# Patient Record
Sex: Female | Born: 1955 | Race: White | Hispanic: No | Smoking: Never smoker
Health system: Southern US, Community
[De-identification: ages and names within clinical notes are randomized; demographics above are authoritative.]

## PROBLEM LIST (undated history)

## (undated) DIAGNOSIS — G35 Multiple sclerosis: Secondary | ICD-10-CM

## (undated) DIAGNOSIS — F329 Major depressive disorder, single episode, unspecified: Secondary | ICD-10-CM

## (undated) DIAGNOSIS — M4800 Spinal stenosis, site unspecified: Secondary | ICD-10-CM

## (undated) DIAGNOSIS — I3139 Other pericardial effusion (noninflammatory): Secondary | ICD-10-CM

## (undated) DIAGNOSIS — R51 Headache: Secondary | ICD-10-CM

## (undated) DIAGNOSIS — E785 Hyperlipidemia, unspecified: Secondary | ICD-10-CM

## (undated) DIAGNOSIS — F32A Depression, unspecified: Secondary | ICD-10-CM

## (undated) DIAGNOSIS — R519 Headache, unspecified: Secondary | ICD-10-CM

## (undated) DIAGNOSIS — H409 Unspecified glaucoma: Secondary | ICD-10-CM

## (undated) HISTORY — PX: LEG ANGIOGRAPHY: SHX6672

## (undated) HISTORY — PX: COLONOSCOPY: SHX174

## (undated) HISTORY — PX: CHOLECYSTECTOMY: SHX55

## (undated) HISTORY — PX: CARPAL TUNNEL RELEASE: SHX101

## (undated) HISTORY — PX: BACK SURGERY: SHX140

## (undated) HISTORY — PX: TONSILLECTOMY: SUR1361

---

## 2015-05-29 ENCOUNTER — Other Ambulatory Visit: Payer: Self-pay | Admitting: Neurology

## 2015-05-29 DIAGNOSIS — G35 Multiple sclerosis: Secondary | ICD-10-CM

## 2015-06-17 ENCOUNTER — Ambulatory Visit
Admission: RE | Admit: 2015-06-17 | Discharge: 2015-06-17 | Disposition: A | Discharge status: Home / Self Care | Payer: Medicare Other | Source: Ambulatory Visit | Attending: Neurology | Admitting: Neurology

## 2015-06-17 DIAGNOSIS — Z9889 Other specified postprocedural states: Secondary | ICD-10-CM | POA: Insufficient documentation

## 2015-06-17 DIAGNOSIS — M5136 Other intervertebral disc degeneration, lumbar region: Secondary | ICD-10-CM | POA: Insufficient documentation

## 2015-06-17 DIAGNOSIS — M4806 Spinal stenosis, lumbar region: Secondary | ICD-10-CM | POA: Diagnosis not present

## 2015-06-17 DIAGNOSIS — N281 Cyst of kidney, acquired: Secondary | ICD-10-CM | POA: Insufficient documentation

## 2015-06-17 DIAGNOSIS — G35 Multiple sclerosis: Secondary | ICD-10-CM | POA: Insufficient documentation

## 2015-06-17 MED ORDER — GADOBENATE DIMEGLUMINE 529 MG/ML IV SOLN
8.0000 mL | Freq: Once | INTRAVENOUS | Status: AC | PRN
  Administered 2015-06-17: 8 mL via INTRAVENOUS

## 2015-10-25 ENCOUNTER — Encounter (INDEPENDENT_AMBULATORY_CARE_PROVIDER_SITE_OTHER): Payer: Medicare HMO | Admitting: Ophthalmology

## 2015-10-25 DIAGNOSIS — H2513 Age-related nuclear cataract, bilateral: Secondary | ICD-10-CM

## 2015-10-25 DIAGNOSIS — H35342 Macular cyst, hole, or pseudohole, left eye: Secondary | ICD-10-CM | POA: Diagnosis not present

## 2015-10-25 DIAGNOSIS — H43813 Vitreous degeneration, bilateral: Secondary | ICD-10-CM | POA: Diagnosis not present

## 2015-10-25 NOTE — H&P (Signed)
Carly Abbott is an 60 y.o. female.   Chief Complaint:blurred vision left eye HPI: blurred vision left eye.  Unknown duration, had eye exam 3 years ago.  No past medical history on file.  No past surgical history on file.  No family history on file. Social History:  has no tobacco, alcohol, and drug history on file.  Allergies: Allergies not on file  No prescriptions prior to admission    Review of systems otherwise negative  There were no vitals taken for this visit.  Physical exam: Mental status: oriented x3. Eyes: See eye exam associated with this date of surgery in media tab.  Scanned in by scanning center Ears, Nose, Throat: within normal limits Neck: Within Normal limits General: within normal limits Chest: Within normal limits Breast: deferred Heart: Within normal limits Abdomen: Within normal limits GU: deferred Extremities: within normal limits Skin: within normal limits  Assessment/Plan Full thickness macular hole left eye Plan: To [facility] for Pars plana vitrectomy, membrane peel. Laser, serum patch, gas injection left eye  Hayden Pedro 10/25/2015, 3:38 PM

## 2015-11-04 NOTE — Progress Notes (Signed)
Called pt for pre-op call. There was a note from the pharmacy tech that pt had cancelled surgery. When I asked her if she was still having the surgery, she states the surgery was for today and she hadn't been able to get her the exam done that Dr. Zigmund Daniel wanted her to have. I told her that her surgery is tomorrow and if she is cancelling, she needs to call his office. She states she had tried calling them on Thursday but the office was closed and it didn't have any way of leaving a message.

## 2015-11-05 ENCOUNTER — Ambulatory Visit (HOSPITAL_COMMUNITY): Admission: RE | Admit: 2015-11-05 | Payer: Medicare HMO | Source: Ambulatory Visit | Admitting: Ophthalmology

## 2015-11-05 ENCOUNTER — Encounter (HOSPITAL_COMMUNITY): Admission: RE | Payer: Self-pay | Source: Ambulatory Visit

## 2015-11-05 SURGERY — 25 GAUGE PARS PLANA VITRECTOMY WITH 20 GAUGE MVR PORT FOR MACULAR HOLE
Anesthesia: General | Laterality: Left

## 2015-11-11 ENCOUNTER — Inpatient Hospital Stay (INDEPENDENT_AMBULATORY_CARE_PROVIDER_SITE_OTHER): Payer: Medicare HMO | Admitting: Ophthalmology

## 2016-01-10 ENCOUNTER — Other Ambulatory Visit: Payer: Self-pay | Admitting: Family Medicine

## 2016-05-15 ENCOUNTER — Encounter: Payer: Self-pay | Admitting: *Deleted

## 2016-05-18 ENCOUNTER — Ambulatory Visit: Payer: Medicare Other | Admitting: Certified Registered Nurse Anesthetist

## 2016-05-18 ENCOUNTER — Encounter: Payer: Self-pay | Admitting: *Deleted

## 2016-05-18 ENCOUNTER — Encounter
Admission: RE | Disposition: A | Discharge status: Home / Self Care | Payer: Self-pay | Source: Ambulatory Visit | Attending: Unknown Physician Specialty

## 2016-05-18 ENCOUNTER — Ambulatory Visit
Admission: RE | Admit: 2016-05-18 | Discharge: 2016-05-18 | Disposition: A | Discharge status: Home / Self Care | Payer: Medicare Other | Source: Ambulatory Visit | Attending: Unknown Physician Specialty | Admitting: Unknown Physician Specialty

## 2016-05-18 DIAGNOSIS — H409 Unspecified glaucoma: Secondary | ICD-10-CM | POA: Insufficient documentation

## 2016-05-18 DIAGNOSIS — Z79899 Other long term (current) drug therapy: Secondary | ICD-10-CM | POA: Diagnosis not present

## 2016-05-18 DIAGNOSIS — F329 Major depressive disorder, single episode, unspecified: Secondary | ICD-10-CM | POA: Insufficient documentation

## 2016-05-18 DIAGNOSIS — Z800 Family history of malignant neoplasm of digestive organs: Secondary | ICD-10-CM | POA: Diagnosis not present

## 2016-05-18 DIAGNOSIS — K640 First degree hemorrhoids: Secondary | ICD-10-CM | POA: Insufficient documentation

## 2016-05-18 DIAGNOSIS — Z1211 Encounter for screening for malignant neoplasm of colon: Secondary | ICD-10-CM | POA: Insufficient documentation

## 2016-05-18 DIAGNOSIS — K621 Rectal polyp: Secondary | ICD-10-CM | POA: Insufficient documentation

## 2016-05-18 DIAGNOSIS — E785 Hyperlipidemia, unspecified: Secondary | ICD-10-CM | POA: Diagnosis not present

## 2016-05-18 DIAGNOSIS — R51 Headache: Secondary | ICD-10-CM | POA: Diagnosis not present

## 2016-05-18 DIAGNOSIS — G35 Multiple sclerosis: Secondary | ICD-10-CM | POA: Insufficient documentation

## 2016-05-18 HISTORY — DX: Depression, unspecified: F32.A

## 2016-05-18 HISTORY — DX: Hyperlipidemia, unspecified: E78.5

## 2016-05-18 HISTORY — PX: COLONOSCOPY WITH PROPOFOL: SHX5780

## 2016-05-18 HISTORY — DX: Spinal stenosis, site unspecified: M48.00

## 2016-05-18 HISTORY — DX: Headache: R51

## 2016-05-18 HISTORY — DX: Headache, unspecified: R51.9

## 2016-05-18 HISTORY — DX: Major depressive disorder, single episode, unspecified: F32.9

## 2016-05-18 HISTORY — DX: Unspecified glaucoma: H40.9

## 2016-05-18 HISTORY — DX: Multiple sclerosis: G35

## 2016-05-18 SURGERY — COLONOSCOPY WITH PROPOFOL
Anesthesia: General

## 2016-05-18 MED ORDER — PROPOFOL 10 MG/ML IV BOLUS
INTRAVENOUS | Status: DC | PRN
  Administered 2016-05-18 (×2): 10 mg via INTRAVENOUS
  Administered 2016-05-18: 50 mg via INTRAVENOUS

## 2016-05-18 MED ORDER — PROPOFOL 500 MG/50ML IV EMUL
INTRAVENOUS | Status: DC | PRN
  Administered 2016-05-18: 140 ug/kg/min via INTRAVENOUS

## 2016-05-18 MED ORDER — SODIUM CHLORIDE 0.9 % IV SOLN
INTRAVENOUS | Status: DC
  Administered 2016-05-18: 1000 mL via INTRAVENOUS

## 2016-05-18 NOTE — Transfer of Care (Signed)
Immediate Anesthesia Transfer of Care Note  Patient: SCOTTI MOTTER  Procedure(s) Performed: Procedure(s): COLONOSCOPY WITH PROPOFOL (N/A)  Patient Location: Endoscopy Unit  Anesthesia Type:General  Level of Consciousness: awake, alert , oriented and patient cooperative  Airway & Oxygen Therapy: Patient Spontanous Breathing and Patient connected to nasal cannula oxygen  Post-op Assessment: Report given to RN, Post -op Vital signs reviewed and stable and Patient moving all extremities X 4  Post vital signs: Reviewed and stable  Last Vitals:  Vitals:   05/18/16 1321  BP: 133/87  Pulse: 97  Resp: 18  Temp: 37.1 C    Last Pain:  Vitals:   05/18/16 1321  TempSrc: Tympanic  PainSc: 7          Complications: No apparent anesthesia complications

## 2016-05-18 NOTE — Anesthesia Postprocedure Evaluation (Signed)
Anesthesia Post Note  Patient: Carly Abbott  Procedure(s) Performed: Procedure(s) (LRB): COLONOSCOPY WITH PROPOFOL (N/A)  Patient location during evaluation: Endoscopy Anesthesia Type: General Level of consciousness: awake and alert Pain management: pain level controlled Vital Signs Assessment: post-procedure vital signs reviewed and stable Respiratory status: spontaneous breathing and respiratory function stable Cardiovascular status: stable Anesthetic complications: no    Last Vitals:  Vitals:   05/18/16 1525 05/18/16 1535  BP: (!) 124/93 (!) 151/95  Pulse:    Resp:    Temp:      Last Pain:  Vitals:   05/18/16 1515  TempSrc: Tympanic  PainSc:                  KEPHART,WILLIAM K

## 2016-05-18 NOTE — Anesthesia Preprocedure Evaluation (Signed)
Anesthesia Evaluation  Patient identified by MRN, date of birth, ID band Patient awake    Reviewed: Allergy & Precautions, NPO status , Patient's Chart, lab work & pertinent test results  History of Anesthesia Complications Negative for: history of anesthetic complications  Airway Mallampati: II  TM Distance: >3 FB Neck ROM: Full    Dental no notable dental hx.    Pulmonary neg pulmonary ROS, neg sleep apnea, neg COPD,    breath sounds clear to auscultation- rhonchi (-) wheezing      Cardiovascular Exercise Tolerance: Good (-) hypertension(-) angina(-) CAD and (-) Past MI  Rhythm:Regular Rate:Normal - Systolic murmurs and - Diastolic murmurs    Neuro/Psych  Headaches, Depression Multiple sclerosis     GI/Hepatic negative GI ROS, Neg liver ROS,   Endo/Other  negative endocrine ROSneg diabetes  Renal/GU negative Renal ROS     Musculoskeletal negative musculoskeletal ROS (+)   Abdominal (+) - obese,   Peds  Hematology negative hematology ROS (+)   Anesthesia Other Findings Past Medical History: No date: Depression No date: Glaucoma No date: Headache No date: Hyperlipidemia No date: Multiple sclerosis (HCC) No date: Spinal stenosis   Reproductive/Obstetrics                             Anesthesia Physical Anesthesia Plan  ASA: II  Anesthesia Plan: General   Post-op Pain Management:    Induction: Intravenous  Airway Management Planned: Natural Airway  Additional Equipment:   Intra-op Plan:   Post-operative Plan:   Informed Consent: I have reviewed the patients History and Physical, chart, labs and discussed the procedure including the risks, benefits and alternatives for the proposed anesthesia with the patient or authorized representative who has indicated his/her understanding and acceptance.   Dental advisory given  Plan Discussed with: CRNA and  Anesthesiologist  Anesthesia Plan Comments:         Anesthesia Quick Evaluation

## 2016-05-18 NOTE — H&P (Signed)
   Primary Care Physician:  Dion Body, MD Primary Gastroenterologist:  Dr. Vira Agar  Pre-Procedure History & Physical: HPI:  Carly Abbott is a 60 y.o. female is here for an colonoscopy.   Past Medical History:  Diagnosis Date  . Depression   . Glaucoma   . Headache   . Hyperlipidemia   . Multiple sclerosis ([place])   . Spinal stenosis     Past Surgical History:  Procedure Laterality Date  . BACK SURGERY    . CARPAL TUNNEL RELEASE    . CHOLECYSTECTOMY    . COLONOSCOPY    . LEG ANGIOGRAPHY    . TONSILLECTOMY      Prior to Admission medications   Medication Sig Start Date End Date Taking? Authorizing Provider  atorvastatin (LIPITOR) 10 MG tablet Take 10 mg by mouth daily.   Yes Historical Provider, MD  calcium carbonate (OSCAL) 1500 (600 Ca) MG TABS tablet Take by mouth 2 (two) times daily with a meal.   Yes Historical Provider, MD  cyclobenzaprine (FLEXERIL) 10 MG tablet Take 10 mg by mouth 3 (three) times daily as needed for muscle spasms.   Yes Historical Provider, MD  diazepam (VALIUM) 5 MG tablet Take 5 mg by mouth every 6 (six) hours as needed for anxiety.   Yes Historical Provider, MD  Interferon Beta-1a (AVONEX PEN) 30 MCG/0.5ML AJKT Inject into the muscle.   Yes Historical Provider, MD  latanoprost (XALATAN) 0.005 % ophthalmic solution 1 drop at bedtime.   Yes Historical Provider, MD  sertraline (ZOLOFT) 100 MG tablet Take 100 mg by mouth daily.   Yes Historical Provider, MD    Allergies as of 03/27/2016  . (Not on File)    History reviewed. No pertinent family history.  Social History   Social History  . Marital status: Divorced    Spouse name: N/A  . Number of children: N/A  . Years of education: N/A   Occupational History  . Not on file.   Social History Main Topics  . Smoking status: Never Smoker  . Smokeless tobacco: Never Used  . Alcohol use No  . Drug use: No  . Sexual activity: Not on file   Other Topics Concern  . Not on file    Social History Narrative  . No narrative on file    Review of Systems: See HPI, otherwise negative ROS  Physical Exam: BP 133/87   Pulse 97   Temp 98.7 F (37.1 C) (Tympanic)   Resp 18   Ht 5' (1.524 m)   Wt 43.1 kg (95 lb)   SpO2 97%   BMI 18.55 kg/m  General:   Alert,  pleasant and cooperative in NAD Head:  Normocephalic and atraumatic. Neck:  Supple; no masses or thyromegaly. Lungs:  Clear throughout to auscultation.    Heart:  Regular rate and rhythm. Abdomen:  Soft, nontender and nondistended. Normal bowel sounds, without guarding, and without rebound.   Neurologic:  Alert and  oriented x4;  grossly normal neurologically.  Impression/Plan: [name] is here for an colonoscopy to be performed for FH colon cancer in mother  Risks, benefits, limitations, and alternatives regarding  colonoscopy have been reviewed with the patient.  Questions have been answered.  All parties agreeable.   Gaylyn Cheers, MD  05/18/2016, 2:33 PM

## 2016-05-18 NOTE — Op Note (Addendum)
[facility] Gastroenterology Patient Name: Carly Abbott Procedure Date: 05/18/2016 2:25 PM MRN: [id number] Account #: [id number] Date of Birth: June 19, 1956 Admit Type: Outpatient Age: 60 Room: [facility] ENDO ROOM 4 Gender: Female Note Status: Finalized Procedure:            Colonoscopy Indications:          Screening in patient at increased risk: Family history                        of 1st-degree relative with colorectal cancer Providers:            Manya Silvas, MD Referring MD:         Dion Body (Referring MD) Medicines:            Propofol per Anesthesia Complications:        No immediate complications. Procedure:            Pre-Anesthesia Assessment:                       - After reviewing the risks and benefits, the patient                        was deemed in satisfactory condition to undergo the                        procedure.                       After obtaining informed consent, the colonoscope was                        passed under direct vision. Throughout the procedure,                        the patient's blood pressure, pulse, and oxygen                        saturations were monitored continuously. The                        Colonoscope was introduced through the anus and                        advanced to the the cecum, identified by appendiceal                        orifice and ileocecal valve. The colonoscopy was                        technically difficult and complex due to significant                        looping and a tortuous colon. Successful completion of                        the procedure was aided by applying abdominal pressure.                        The patient tolerated the procedure well. The quality  of the bowel preparation was excellent. Findings:      The colon was quite long and redundant.      A diminutive polyp was found in the rectum. The polyp was sessile. The       polyp was removed  with a jumbo cold forceps. Resection and retrieval       were complete.      Internal hemorrhoids were found during endoscopy. The hemorrhoids were       small and Grade I (internal hemorrhoids that do not prolapse).      The exam was otherwise without abnormality. Impression:           - One diminutive polyp in the rectum, removed with a                        jumbo cold forceps. Resected and retrieved.                       - Internal hemorrhoids.                       - The examination was otherwise normal. Recommendation:       - Await pathology results. Manya Silvas, MD 05/18/2016 3:12:02 PM This report has been signed electronically. Number of Addenda: 0 Note Initiated On: 05/18/2016 2:25 PM Scope Withdrawal Time: 0 hours 12 minutes 4 seconds  Total Procedure Duration: 0 hours 29 minutes 19 seconds       [facility]

## 2016-05-19 ENCOUNTER — Encounter: Payer: Self-pay | Admitting: Unknown Physician Specialty

## 2016-05-20 LAB — SURGICAL PATHOLOGY

## 2017-02-25 ENCOUNTER — Other Ambulatory Visit: Payer: Self-pay | Admitting: Neurology

## 2017-02-25 DIAGNOSIS — G35 Multiple sclerosis: Secondary | ICD-10-CM

## 2017-03-03 ENCOUNTER — Ambulatory Visit
Admission: RE | Admit: 2017-03-03 | Discharge: 2017-03-03 | Disposition: A | Discharge status: Home / Self Care | Payer: Medicare Other | Source: Ambulatory Visit | Attending: Neurology | Admitting: Neurology

## 2018-04-14 ENCOUNTER — Other Ambulatory Visit: Payer: Self-pay | Admitting: Family Medicine

## 2018-04-14 DIAGNOSIS — Z1231 Encounter for screening mammogram for malignant neoplasm of breast: Secondary | ICD-10-CM

## 2018-04-25 ENCOUNTER — Ambulatory Visit
Admission: RE | Admit: 2018-04-25 | Discharge: 2018-04-25 | Disposition: A | Discharge status: Home / Self Care | Payer: Medicare Other | Source: Ambulatory Visit | Attending: Neurology | Admitting: Neurology

## 2018-04-25 DIAGNOSIS — G35 Multiple sclerosis: Secondary | ICD-10-CM | POA: Diagnosis present

## 2018-04-25 DIAGNOSIS — D3610 Benign neoplasm of peripheral nerves and autonomic nervous system, unspecified: Secondary | ICD-10-CM | POA: Diagnosis not present

## 2018-04-25 MED ORDER — GADOBUTROL 1 MMOL/ML IV SOLN
4.0000 mL | Freq: Once | INTRAVENOUS | Status: AC | PRN
  Administered 2018-04-25: 4 mL via INTRAVENOUS

## 2018-04-27 ENCOUNTER — Other Ambulatory Visit: Payer: Self-pay | Admitting: Sports Medicine

## 2018-04-27 DIAGNOSIS — M25511 Pain in right shoulder: Secondary | ICD-10-CM

## 2018-04-27 DIAGNOSIS — M7541 Impingement syndrome of right shoulder: Secondary | ICD-10-CM

## 2018-04-27 DIAGNOSIS — M19011 Primary osteoarthritis, right shoulder: Secondary | ICD-10-CM

## 2018-05-05 ENCOUNTER — Ambulatory Visit
Admission: RE | Admit: 2018-05-05 | Discharge: 2018-05-05 | Disposition: A | Discharge status: Home / Self Care | Payer: Medicare Other | Source: Ambulatory Visit | Attending: Family Medicine | Admitting: Family Medicine

## 2018-05-05 DIAGNOSIS — Z1231 Encounter for screening mammogram for malignant neoplasm of breast: Secondary | ICD-10-CM | POA: Diagnosis not present

## 2018-05-16 ENCOUNTER — Other Ambulatory Visit: Payer: Self-pay | Admitting: Family Medicine

## 2018-05-16 DIAGNOSIS — M4807 Spinal stenosis, lumbosacral region: Secondary | ICD-10-CM

## 2018-05-24 ENCOUNTER — Ambulatory Visit
Admission: RE | Admit: 2018-05-24 | Discharge: 2018-05-24 | Disposition: A | Discharge status: Home / Self Care | Payer: Medicare Other | Source: Ambulatory Visit | Attending: Sports Medicine | Admitting: Sports Medicine

## 2018-05-24 DIAGNOSIS — M75111 Incomplete rotator cuff tear or rupture of right shoulder, not specified as traumatic: Secondary | ICD-10-CM | POA: Diagnosis not present

## 2018-05-24 DIAGNOSIS — S46211A Strain of muscle, fascia and tendon of other parts of biceps, right arm, initial encounter: Secondary | ICD-10-CM | POA: Insufficient documentation

## 2018-05-24 DIAGNOSIS — M25511 Pain in right shoulder: Secondary | ICD-10-CM

## 2018-05-24 DIAGNOSIS — M7541 Impingement syndrome of right shoulder: Secondary | ICD-10-CM | POA: Diagnosis not present

## 2018-05-24 DIAGNOSIS — G8929 Other chronic pain: Secondary | ICD-10-CM | POA: Insufficient documentation

## 2018-05-24 DIAGNOSIS — M85611 Other cyst of bone, right shoulder: Secondary | ICD-10-CM | POA: Insufficient documentation

## 2018-05-24 DIAGNOSIS — X58XXXA Exposure to other specified factors, initial encounter: Secondary | ICD-10-CM | POA: Insufficient documentation

## 2018-05-24 DIAGNOSIS — M19011 Primary osteoarthritis, right shoulder: Secondary | ICD-10-CM | POA: Diagnosis not present

## 2018-06-07 ENCOUNTER — Ambulatory Visit
Admission: RE | Admit: 2018-06-07 | Discharge: 2018-06-07 | Disposition: A | Discharge status: Home / Self Care | Payer: Medicare Other | Source: Ambulatory Visit | Attending: Family Medicine | Admitting: Family Medicine

## 2018-06-07 DIAGNOSIS — M5136 Other intervertebral disc degeneration, lumbar region: Secondary | ICD-10-CM | POA: Insufficient documentation

## 2018-06-07 DIAGNOSIS — M48061 Spinal stenosis, lumbar region without neurogenic claudication: Secondary | ICD-10-CM | POA: Insufficient documentation

## 2018-06-07 DIAGNOSIS — M4807 Spinal stenosis, lumbosacral region: Secondary | ICD-10-CM | POA: Insufficient documentation

## 2018-07-14 ENCOUNTER — Inpatient Hospital Stay: Admission: RE | Admit: 2018-07-14 | Payer: Medicare Other | Source: Ambulatory Visit

## 2018-07-25 ENCOUNTER — Ambulatory Visit: Admission: RE | Admit: 2018-07-25 | Payer: Medicare Other | Source: Home / Self Care | Admitting: Orthopedic Surgery

## 2018-07-25 ENCOUNTER — Encounter: Admission: RE | Payer: Self-pay | Source: Home / Self Care

## 2018-07-25 SURGERY — SHOULDER ARTHROSCOPY WITH SUBACROMIAL DECOMPRESSION AND DISTAL CLAVICLE EXCISION
Anesthesia: Choice | Laterality: Right

## 2018-08-12 ENCOUNTER — Other Ambulatory Visit: Payer: Self-pay | Admitting: Neurosurgery

## 2018-08-12 DIAGNOSIS — M4807 Spinal stenosis, lumbosacral region: Secondary | ICD-10-CM

## 2018-08-17 ENCOUNTER — Ambulatory Visit: Payer: Medicare Other | Attending: Neurosurgery

## 2019-01-24 DIAGNOSIS — E7800 Pure hypercholesterolemia, unspecified: Secondary | ICD-10-CM | POA: Diagnosis not present

## 2019-01-24 DIAGNOSIS — F3340 Major depressive disorder, recurrent, in remission, unspecified: Secondary | ICD-10-CM | POA: Diagnosis not present

## 2019-02-14 DIAGNOSIS — G35 Multiple sclerosis: Secondary | ICD-10-CM | POA: Diagnosis not present

## 2019-02-14 DIAGNOSIS — Z8739 Personal history of other diseases of the musculoskeletal system and connective tissue: Secondary | ICD-10-CM | POA: Diagnosis not present

## 2019-09-09 ENCOUNTER — Other Ambulatory Visit: Payer: Self-pay

## 2019-09-09 ENCOUNTER — Ambulatory Visit: Payer: Medicare Other | Attending: Internal Medicine

## 2019-09-09 DIAGNOSIS — Z23 Encounter for immunization: Secondary | ICD-10-CM

## 2019-09-09 NOTE — Progress Notes (Signed)
   Covid-19 Vaccination Clinic  Name:  Carly Abbott    MRN: [id number] DOB: 1955-08-24  09/09/2019  [name] was observed post Covid-19 immunization for 15 minutes without incidence. She was provided with Vaccine Information Sheet and instruction to access the V-Safe system.   [name] was instructed to call 911 with any severe reactions post vaccine: Marland Kitchen Difficulty breathing  . Swelling of your face and throat  . A fast heartbeat  . A bad rash all over your body  . Dizziness and weakness    Immunizations Administered    Name Date Dose VIS Date Route   Pfizer COVID-19 Vaccine 09/09/2019  3:28 PM 0.3 mL 06/30/2019 Intramuscular   Manufacturer: [place]   Lot: [id number]   [place]: [id number]

## 2019-10-03 ENCOUNTER — Ambulatory Visit: Payer: Medicare Other | Attending: Internal Medicine

## 2019-10-03 DIAGNOSIS — Z23 Encounter for immunization: Secondary | ICD-10-CM

## 2019-10-03 NOTE — Progress Notes (Signed)
   Covid-19 Vaccination Clinic  Name:  CHANTILLE NAVARRETE    MRN: [id number] DOB: 1956-07-16  10/03/2019  Ms. Urias was observed post Covid-19 immunization for 15 minutes without incident. She was provided with Vaccine Information Sheet and instruction to access the V-Safe system.   Ms. Yost was instructed to call 911 with any severe reactions post vaccine: Marland Kitchen Difficulty breathing  . Swelling of face and throat  . A fast heartbeat  . A bad rash all over body  . Dizziness and weakness   Immunizations Administered    Name Date Dose VIS Date Route   Pfizer COVID-19 Vaccine 10/03/2019 12:12 PM 0.3 mL 06/30/2019 Intramuscular   Manufacturer: [place]   Lot: [id number]   [place]: [id number]

## 2019-10-05 IMAGING — MR MR LUMBAR SPINE W/O CM
4 of 5 series · 25 of 48 positions shown · non-contrast
Comparison: 06/17/2015

CLINICAL DATA: Low back pain radiating into the right greater than
left legs with numbness and tingling. Symptoms for 2 months.

EXAM:
MRI LUMBAR SPINE WITHOUT CONTRAST
TECHNIQUE: Multiplanar, multisequence MR imaging of the lumbar spine was
performed. No intravenous contrast was administered.

[Series 2: T2 · sagittal · 4.0mm · 0.81mm/px · 6 of 15 slices shown (1 of 2)]
[im 1/15]
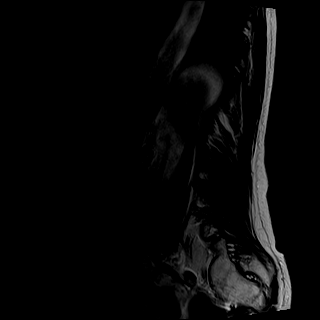
[im 3/15]
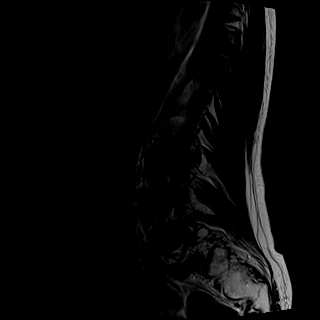
[im 6/15]
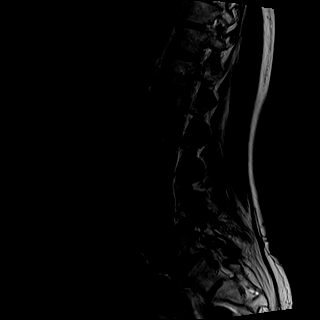
[im 9/15]
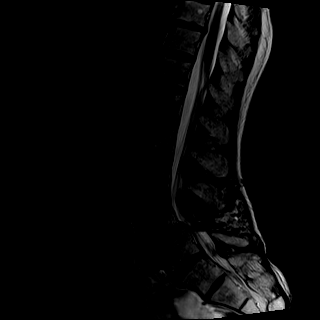
[im 12/15]
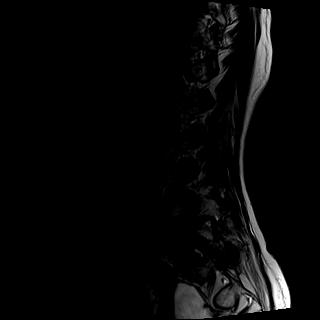
[im 15/15]
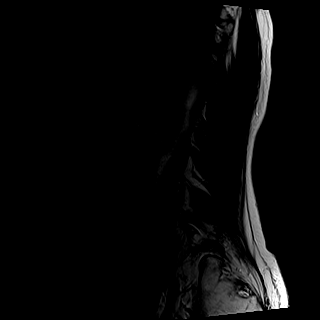

[Series 3: T1 · sagittal · 4.0mm · 0.41mm/px · 6 of 15 slices shown (1 of 2)]
[im 1/15]
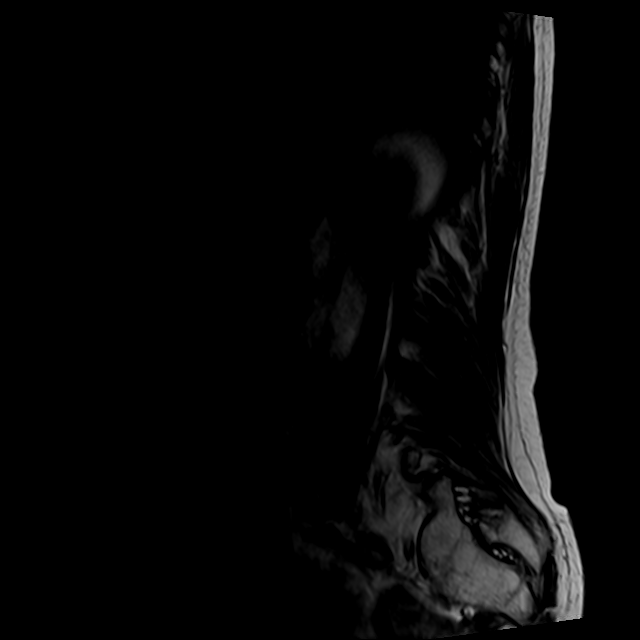
[im 3/15]
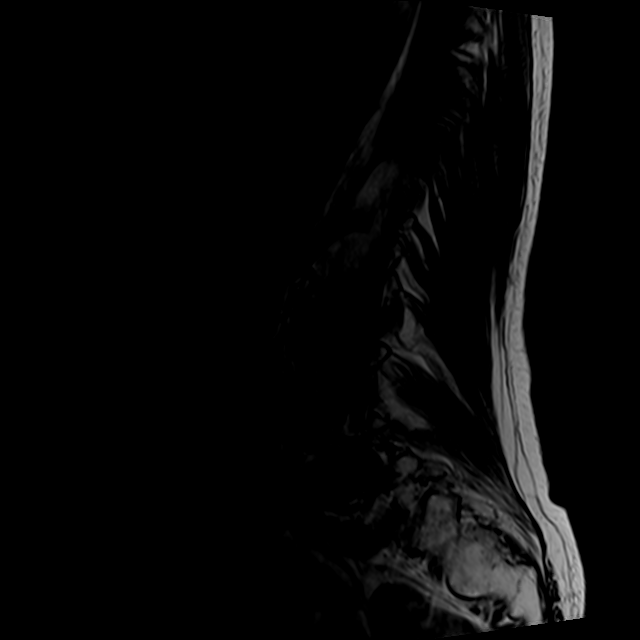
[im 6/15]
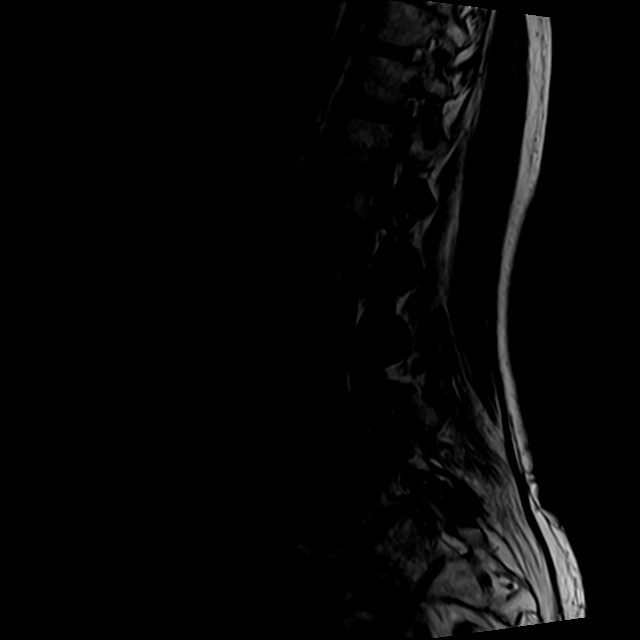
[im 9/15]
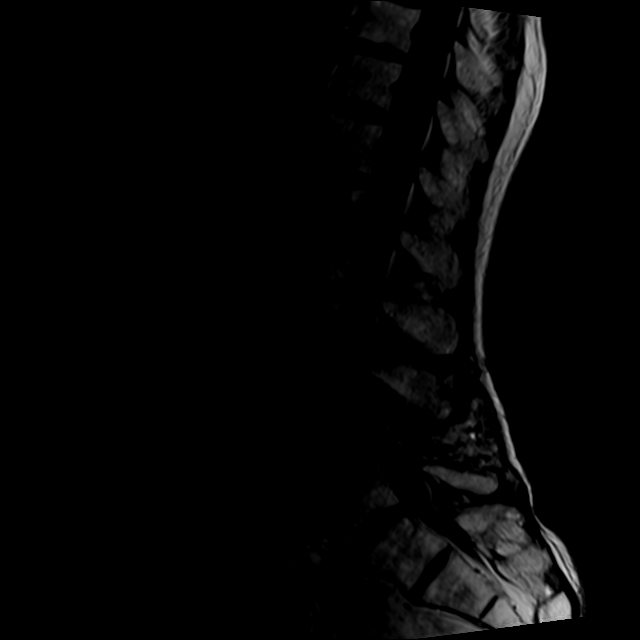
[im 12/15]
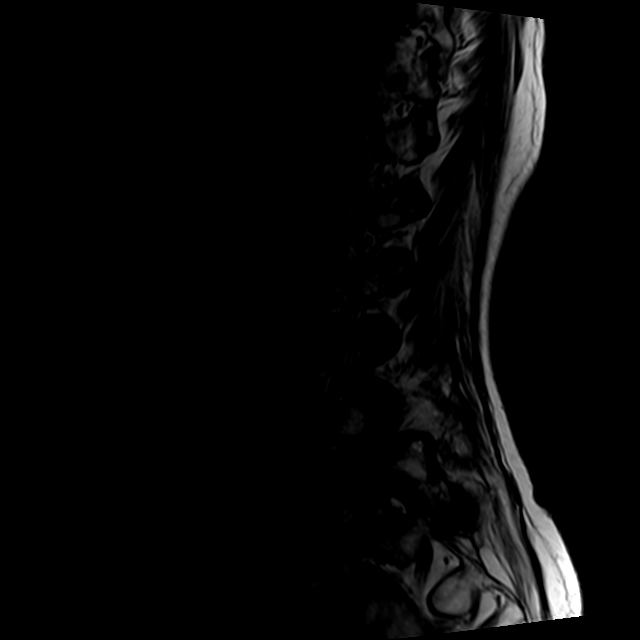
[im 15/15]
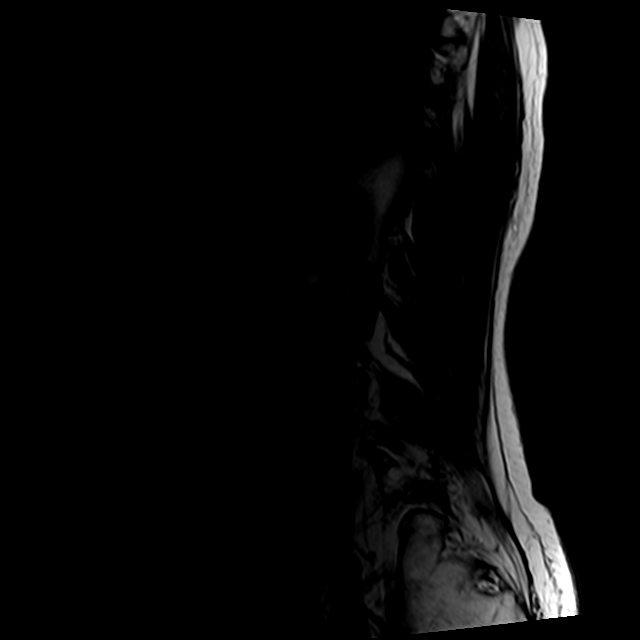

[Series 5: T2 · axial · 4.0mm · 0.78mm/px · z∈[-88,+132]mm · 9 of 40 slices shown (2 of 2)]
[im 1/40]
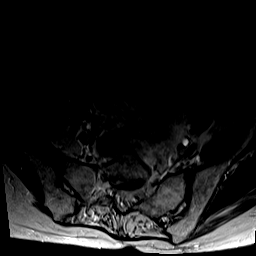
[im 6/40]
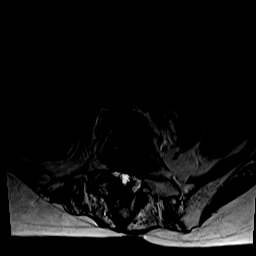
[im 12/40]
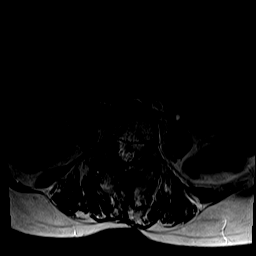
[im 17/40]
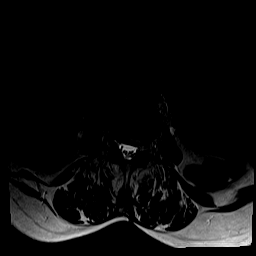
[im 20/40]
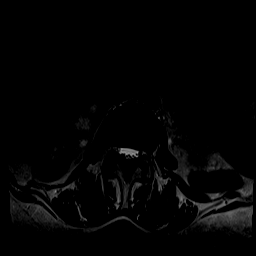
[im 23/40]
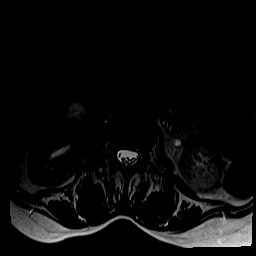
[im 28/40]
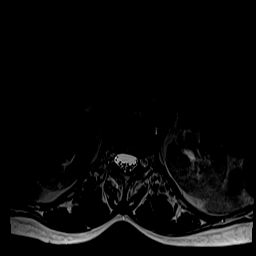
[im 34/40]
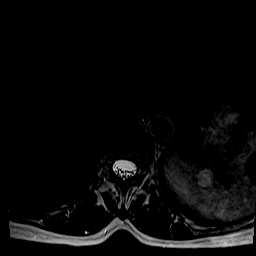
[im 40/40]
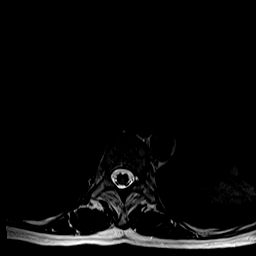

[Series 6: T1 · axial · 4.0mm · 0.39mm/px · z∈[-88,+102]mm · 4 of 40 slices shown (2 of 2)]
[im 1/40]
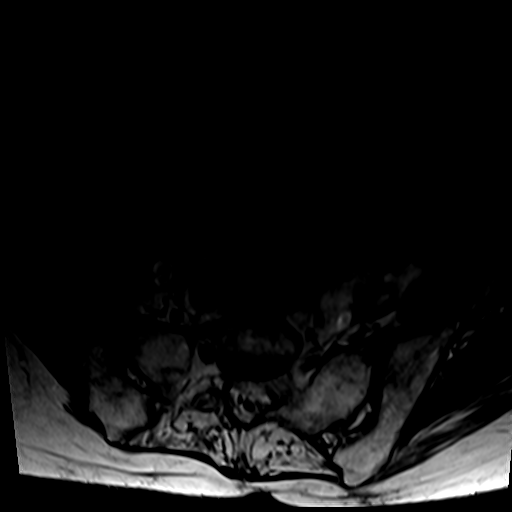
[im 6/40]
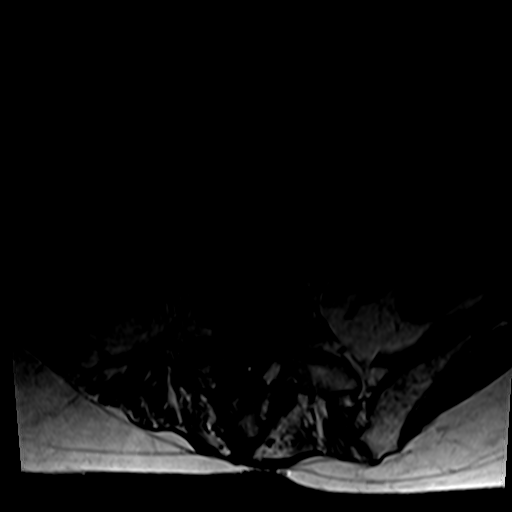
[im 20/40]
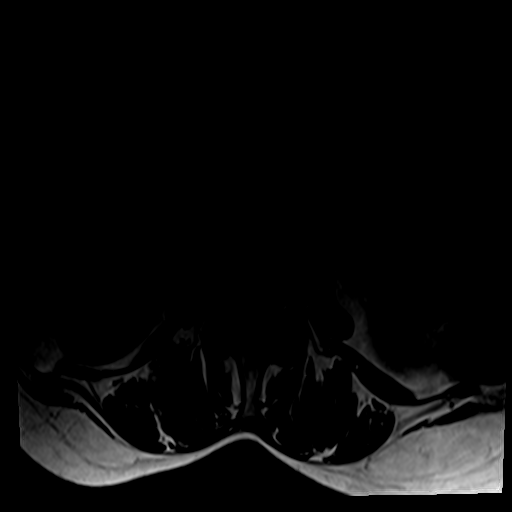
[im 34/40]
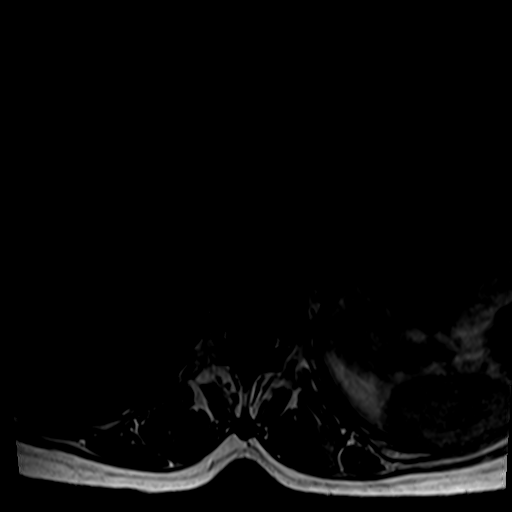

[25 of 48 positions shown; findings below may reference images not displayed]

FINDINGS: Segmentation: Transitional lumbosacral anatomy with the prior
numbering scheme continued on this examination designating a
vestigial disc at S1-2 and anterolisthesis at L4-5.

Alignment:  Unchanged 10 mm anterolisthesis of L4 on L5.

Vertebrae: No fracture or suspicious osseous lesion. Moderate
endplate edema at L4-5, increased from prior and likely
degenerative.

Conus medullaris and cauda equina: Conus extends to the T12 level.
Conus and cauda equina appear normal.

Paraspinal and other soft tissues: Unchanged 1.3 cm T2 hyperintense
lesion in the upper pole of the left kidney compatible with a cyst.
Mild chronic extrahepatic biliary dilatation, mildly decreased from
prior.

Disc levels:

Disc desiccation throughout the lumbar spine with severe disc space
narrowing at L4-5 and mild narrowing at L5-S1.

T10-11: Only imaged sagittally. Disc bulging and moderate facet
hypertrophy result in borderline spinal stenosis without spinal cord
mass effect or significant neural foraminal stenosis, unchanged.

T11-12: Minimal facet arthrosis without disc herniation or stenosis.

T12-L1: Negative.

L1-2: Minimal facet arthrosis without disc herniation or stenosis.

L2-3: Minimal disc bulging and mild facet and ligamentum flavum
hypertrophy without stenosis, unchanged.

L3-4: Suspected prior left laminotomy. Mild disc bulging and
moderate facet and ligamentum flavum hypertrophy are stable to
minimally progressive and result in borderline spinal stenosis
without significant neural foraminal stenosis.

L4-5: Progressive, severe disc degeneration. Prior laminectomies.
Anterolisthesis with bulging uncovered disc and facet arthrosis
result in severe spinal stenosis, severe left greater than right
lateral recess stenosis, and severe bilateral neural foraminal
stenosis. Findings have slightly progressed from the prior study and
bilateral neural impingement is likely at this level.

L5-S1: Posterior postsurgical changes bilaterally. Mild disc bulging
asymmetric to the left and facet hypertrophy result in mild spinal
and bilateral lateral recess stenosis without significant neural
foraminal stenosis, unchanged.
IMPRESSION: 1. Mild progression of severe L4-5 disc degeneration with severe
spinal and bilateral neural foraminal stenosis.
2. Stable degenerative and postoperative changes elsewhere as above.

## 2021-05-09 DIAGNOSIS — Z23 Encounter for immunization: Secondary | ICD-10-CM | POA: Diagnosis not present

## 2021-09-29 ENCOUNTER — Other Ambulatory Visit (INDEPENDENT_AMBULATORY_CARE_PROVIDER_SITE_OTHER): Payer: Self-pay | Admitting: Nurse Practitioner

## 2021-09-29 DIAGNOSIS — R209 Unspecified disturbances of skin sensation: Secondary | ICD-10-CM

## 2021-09-30 ENCOUNTER — Ambulatory Visit (INDEPENDENT_AMBULATORY_CARE_PROVIDER_SITE_OTHER): Payer: Medicare Other

## 2021-09-30 ENCOUNTER — Other Ambulatory Visit (INDEPENDENT_AMBULATORY_CARE_PROVIDER_SITE_OTHER): Payer: Self-pay | Admitting: Nurse Practitioner

## 2021-09-30 ENCOUNTER — Other Ambulatory Visit: Payer: Self-pay

## 2021-09-30 ENCOUNTER — Encounter (INDEPENDENT_AMBULATORY_CARE_PROVIDER_SITE_OTHER): Payer: Self-pay | Admitting: Nurse Practitioner

## 2021-09-30 ENCOUNTER — Ambulatory Visit (INDEPENDENT_AMBULATORY_CARE_PROVIDER_SITE_OTHER): Payer: Medicare Other | Admitting: Nurse Practitioner

## 2021-09-30 VITALS — BP 152/91 | HR 91 | Resp 17 | Ht <= 58 in | Wt 85.8 lb

## 2021-09-30 DIAGNOSIS — R209 Unspecified disturbances of skin sensation: Secondary | ICD-10-CM

## 2021-09-30 DIAGNOSIS — G35 Multiple sclerosis: Secondary | ICD-10-CM

## 2021-09-30 MED ORDER — AMLODIPINE BESYLATE 5 MG PO TABS: 5.0000 mg | ORAL_TABLET | Freq: Every day | ORAL | 2 refills | Status: DC

## 2021-10-08 ENCOUNTER — Encounter (INDEPENDENT_AMBULATORY_CARE_PROVIDER_SITE_OTHER): Payer: Self-pay | Admitting: Nurse Practitioner

## 2021-10-08 NOTE — Progress Notes (Signed)
? ?Subjective:  ? ? Patient ID: Carly Abbott, female    DOB: 1955/09/28, 66 y.o.   MRN: [id number] ?Chief Complaint  ?Patient presents with  ? Establish Care  ?  Ref by dr Netty Starring for ABI consult  ? ? ?Carly Abbott is a 66 year old female that presents today as a consult from her primary care physician in regards to numbness and discoloration in her lower extremities.  The patient has a history of multiple sclerosis.  She notes that this has been ongoing for years.  She notes that the lower extremities do not have any open wounds or ulcerations.  Today the patient has a right ABI of 1.26 with a left of 1.28.  She has strong biphasic tibial artery waveforms bilaterally.  Initially waveforms in the bilateral toes were severely dampened on the right first and second digit and not detected in the third fourth and fifth.  The left lower extremity had no waveforms detected throughout the foot.  The toe waveforms following 30 minutes of warming show improved waveforms that are dampened bilaterally. ? ? ?Review of Systems  ?Skin:  Positive for color change.  ?All other systems reviewed and are negative. ? ?   ?Objective:  ? Physical Exam ?Vitals reviewed.  ?HENT:  ?   Head: Normocephalic.  ?Cardiovascular:  ?   Rate and Rhythm: Normal rate.  ?   Pulses: Normal pulses.  ?Pulmonary:  ?   Effort: Pulmonary effort is normal.  ?Skin: ?   General: Skin is warm and dry.  ?Neurological:  ?   Mental Status: She is alert and oriented to person, place, and time.  ?Psychiatric:     ?   Mood and Affect: Mood normal.     ?   Behavior: Behavior normal.     ?   Thought Content: Thought content normal.     ?   Judgment: Judgment normal.  ? ? ?BP (!) 152/91 (BP Location: Right Arm)   Pulse 91   Resp 17   Ht 4\' 10"  (1.473 m)   Wt 85 lb 12.8 oz (38.9 kg)   BMI 17.93 kg/m?  ? ?Past Medical History:  ?Diagnosis Date  ? Depression   ? Glaucoma   ? Headache   ? Hyperlipidemia   ? Multiple sclerosis ([place])   ? Spinal stenosis   ? ? ?Social  History  ? ?Socioeconomic History  ? Marital status: Divorced  ?  Spouse name: Not on file  ? Number of children: Not on file  ? Years of education: Not on file  ? Highest education level: Not on file  ?Occupational History  ? Not on file  ?Tobacco Use  ? Smoking status: Never  ? Smokeless tobacco: Never  ?Substance and Sexual Activity  ? Alcohol use: No  ? Drug use: No  ? Sexual activity: Not on file  ?Other Topics Concern  ? Not on file  ?Social History Narrative  ? Not on file  ? ?Social Determinants of Health  ? ?Financial Resource Strain: Not on file  ?Food Insecurity: Not on file  ?Transportation Needs: Not on file  ?Physical Activity: Not on file  ?Stress: Not on file  ?Social Connections: Not on file  ?Intimate Partner Violence: Not on file  ? ? ?Past Surgical History:  ?Procedure Laterality Date  ? BACK SURGERY    ? CARPAL TUNNEL RELEASE    ? CHOLECYSTECTOMY    ? COLONOSCOPY    ? COLONOSCOPY WITH PROPOFOL N/A 05/18/2016  ?  Procedure: COLONOSCOPY WITH PROPOFOL;  Surgeon: Manya Silvas, MD;  Location: [facility] ENDOSCOPY;  Service: Endoscopy;  Laterality: N/A;  ? LEG ANGIOGRAPHY    ? TONSILLECTOMY    ? ? ?Family History  ?Problem Relation Age of Onset  ? Breast cancer Neg Hx   ? ? ?Allergies  ?Allergen Reactions  ? Cardizem [Diltiazem Hcl] Itching  ? ? ?   ? View : No data to display.  ?  ?  ?  ? ? ? ? ?CMP  ?No results found for: NA, K, CL, CO2, GLUCOSE, BUN, CREATININE, CALCIUM, PROT, ALBUMIN, AST, ALT, ALKPHOS, BILITOT, GFRNONAA, GFRAA ? ? ?No results found. ? ?   ?Assessment & Plan:  ? ?1. Cold feet ?Noninvasive studies today are consistent with Raynaud's disease.  We discussed typical treatment of Raynaud's disease includes use of calcium channel blockers as well as use of warm socks as well as what was.  Currently there are no open lesions ulcerations.  We will have the patient begin a low-dose amlodipine determine if this helps the discoloration symptoms. ?- VAS [place] LE ART SEG MULTI (Segm&LE  Reynauds) ? ? ?Current Outpatient Medications on File Prior to Visit  ?Medication Sig Dispense Refill  ? acetaminophen (TYLENOL) 500 MG tablet Take 500 mg by mouth every 6 (six) hours as needed.    ? DULoxetine (CYMBALTA) 30 MG capsule Take 30 mg by mouth daily.    ? latanoprost (XALATAN) 0.005 % ophthalmic solution 1 drop at bedtime.    ? atorvastatin (LIPITOR) 10 MG tablet Take 10 mg by mouth daily. (Patient not taking: Reported on 09/30/2021)    ? cyclobenzaprine (FLEXERIL) 10 MG tablet Take 10 mg by mouth 3 (three) times daily as needed for muscle spasms. (Patient not taking: Reported on 09/30/2021)    ? diazepam (VALIUM) 5 MG tablet Take 5 mg by mouth every 6 (six) hours as needed for anxiety. (Patient not taking: Reported on 09/30/2021)    ? Interferon Beta-1a 30 MCG/0.5ML AJKT Inject into the muscle. (Patient not taking: Reported on 09/30/2021)    ? ?No current facility-administered medications on file prior to visit.  ? ? ?There are no Patient Instructions on file for this visit. ?No follow-ups on file. ? ? ?Kris Hartmann, NP ? ? ?

## 2021-10-29 ENCOUNTER — Encounter (INDEPENDENT_AMBULATORY_CARE_PROVIDER_SITE_OTHER): Payer: Medicaid Other

## 2021-10-29 ENCOUNTER — Encounter (INDEPENDENT_AMBULATORY_CARE_PROVIDER_SITE_OTHER): Payer: Medicaid Other | Admitting: Nurse Practitioner

## 2021-11-11 ENCOUNTER — Ambulatory Visit (INDEPENDENT_AMBULATORY_CARE_PROVIDER_SITE_OTHER): Payer: Medicare Other | Admitting: Nurse Practitioner

## 2021-11-20 ENCOUNTER — Observation Stay (HOSPITAL_BASED_OUTPATIENT_CLINIC_OR_DEPARTMENT_OTHER)
Admission: EM | Admit: 2021-11-20 | Discharge: 2021-11-22 | Disposition: A | Discharge status: Home Health | Payer: Medicare Other | Source: Home / Self Care | Attending: Emergency Medicine | Admitting: Emergency Medicine

## 2021-11-20 ENCOUNTER — Other Ambulatory Visit: Payer: Self-pay

## 2021-11-20 ENCOUNTER — Emergency Department: Payer: Medicare Other

## 2021-11-20 DIAGNOSIS — E782 Mixed hyperlipidemia: Secondary | ICD-10-CM

## 2021-11-20 DIAGNOSIS — R636 Underweight: Secondary | ICD-10-CM | POA: Insufficient documentation

## 2021-11-20 DIAGNOSIS — M21379 Foot drop, unspecified foot: Secondary | ICD-10-CM | POA: Insufficient documentation

## 2021-11-20 DIAGNOSIS — F32A Depression, unspecified: Secondary | ICD-10-CM | POA: Diagnosis present

## 2021-11-20 DIAGNOSIS — C384 Malignant neoplasm of pleura: Secondary | ICD-10-CM | POA: Insufficient documentation

## 2021-11-20 DIAGNOSIS — Z79899 Other long term (current) drug therapy: Secondary | ICD-10-CM | POA: Insufficient documentation

## 2021-11-20 DIAGNOSIS — I10 Essential (primary) hypertension: Secondary | ICD-10-CM | POA: Insufficient documentation

## 2021-11-20 DIAGNOSIS — J9601 Acute respiratory failure with hypoxia: Secondary | ICD-10-CM

## 2021-11-20 DIAGNOSIS — E785 Hyperlipidemia, unspecified: Secondary | ICD-10-CM | POA: Insufficient documentation

## 2021-11-20 DIAGNOSIS — F419 Anxiety disorder, unspecified: Secondary | ICD-10-CM | POA: Insufficient documentation

## 2021-11-20 DIAGNOSIS — J449 Chronic obstructive pulmonary disease, unspecified: Secondary | ICD-10-CM | POA: Insufficient documentation

## 2021-11-20 DIAGNOSIS — J910 Malignant pleural effusion: Secondary | ICD-10-CM | POA: Insufficient documentation

## 2021-11-20 DIAGNOSIS — G35 Multiple sclerosis: Secondary | ICD-10-CM | POA: Insufficient documentation

## 2021-11-20 DIAGNOSIS — E8809 Other disorders of plasma-protein metabolism, not elsewhere classified: Secondary | ICD-10-CM

## 2021-11-20 DIAGNOSIS — Z9889 Other specified postprocedural states: Secondary | ICD-10-CM

## 2021-11-20 DIAGNOSIS — C3490 Malignant neoplasm of unspecified part of unspecified bronchus or lung: Secondary | ICD-10-CM | POA: Diagnosis not present

## 2021-11-20 DIAGNOSIS — J188 Other pneumonia, unspecified organism: Secondary | ICD-10-CM | POA: Diagnosis present

## 2021-11-20 DIAGNOSIS — J189 Pneumonia, unspecified organism: Secondary | ICD-10-CM

## 2021-11-20 DIAGNOSIS — J90 Pleural effusion, not elsewhere classified: Secondary | ICD-10-CM | POA: Diagnosis present

## 2021-11-20 LAB — CBC WITH DIFFERENTIAL/PLATELET
Abs Immature Granulocytes: 0.03 10*3/uL (ref 0.00–0.07)
Basophils Absolute: 0 10*3/uL (ref 0.0–0.1)
Basophils Relative: 0 %
Eosinophils Absolute: 0.1 10*3/uL (ref 0.0–0.5)
Eosinophils Relative: 1 %
HCT: 41.6 % (ref 36.0–46.0)
Hemoglobin: 13.7 g/dL (ref 12.0–15.0)
Immature Granulocytes: 0 %
Lymphocytes Relative: 7 %
Lymphs Abs: 0.7 10*3/uL (ref 0.7–4.0)
MCH: 29.5 pg (ref 26.0–34.0)
MCHC: 32.9 g/dL (ref 30.0–36.0)
MCV: 89.5 fL (ref 80.0–100.0)
Monocytes Absolute: 0.4 10*3/uL (ref 0.1–1.0)
Monocytes Relative: 5 %
Neutro Abs: 8.1 10*3/uL — ABNORMAL HIGH (ref 1.7–7.7)
Neutrophils Relative %: 87 %
Platelets: 458 10*3/uL — ABNORMAL HIGH (ref 150–400)
RBC: 4.65 MIL/uL (ref 3.87–5.11)
RDW: 12.3 % (ref 11.5–15.5)
WBC: 9.4 10*3/uL (ref 4.0–10.5)
nRBC: 0 % (ref 0.0–0.2)

## 2021-11-20 LAB — COMPREHENSIVE METABOLIC PANEL
ALT: 11 U/L (ref 0–44)
AST: 23 U/L (ref 15–41)
Albumin: 2.8 g/dL — ABNORMAL LOW (ref 3.5–5.0)
Alkaline Phosphatase: 92 U/L (ref 38–126)
Anion gap: 11 (ref 5–15)
BUN: 14 mg/dL (ref 8–23)
CO2: 23 mmol/L (ref 22–32)
Calcium: 8.5 mg/dL — ABNORMAL LOW (ref 8.9–10.3)
Chloride: 99 mmol/L (ref 98–111)
Creatinine, Ser: 0.68 mg/dL (ref 0.44–1.00)
GFR, Estimated: >60 mL/min (ref >60)
Glucose, Bld: 92 mg/dL (ref 70–99)
Potassium: 3.4 mmol/L — ABNORMAL LOW (ref 3.5–5.1)
Sodium: 133 mmol/L — ABNORMAL LOW (ref 135–145)
Total Bilirubin: 0.7 mg/dL (ref 0.3–1.2)
Total Protein: 6.5 g/dL (ref 6.5–8.1)

## 2021-11-20 LAB — LACTIC ACID, PLASMA: Lactic Acid, Venous: 1.8 mmol/L (ref 0.5–1.9)

## 2021-11-20 LAB — PROTIME-INR
INR: 1 (ref 0.8–1.2)
Prothrombin Time: 12.7 seconds (ref 11.4–15.2)

## 2021-11-20 LAB — APTT: aPTT: 30 seconds (ref 24–36)

## 2021-11-20 LAB — PROCALCITONIN: Procalcitonin: <0.1 ng/mL

## 2021-11-20 LAB — PHOSPHORUS: Phosphorus: 3.4 mg/dL (ref 2.5–4.6)

## 2021-11-20 LAB — MAGNESIUM: Magnesium: 1.8 mg/dL (ref 1.7–2.4)

## 2021-11-20 MED ORDER — SODIUM CHLORIDE 0.9 % IV SOLN
500.0000 mg | INTRAVENOUS | Status: DC
  Administered 2021-11-20: 500 mg via INTRAVENOUS
  Filled 2021-11-20 (×2): qty 5

## 2021-11-20 MED ORDER — SODIUM CHLORIDE 0.9 % IV SOLN: INTRAVENOUS | Status: DC

## 2021-11-20 MED ORDER — ALBUTEROL SULFATE (2.5 MG/3ML) 0.083% IN NEBU
2.5000 mg | INHALATION_SOLUTION | Freq: Four times a day (QID) | RESPIRATORY_TRACT | Status: DC | PRN
  Administered 2021-11-21: 2.5 mg via RESPIRATORY_TRACT
  Filled 2021-11-20: qty 3

## 2021-11-20 MED ORDER — AMLODIPINE BESYLATE 5 MG PO TABS
5.0000 mg | ORAL_TABLET | Freq: Every day | ORAL | Status: DC
Start: 2021-11-21 — End: 2021-11-22
  Administered 2021-11-21 – 2021-11-22 (×2): 5 mg via ORAL
  Filled 2021-11-20 (×2): qty 1

## 2021-11-20 MED ORDER — HEPARIN SODIUM (PORCINE) 5000 UNIT/ML IJ SOLN
5000.0000 [IU] | Freq: Three times a day (TID) | INTRAMUSCULAR | Status: AC
  Administered 2021-11-20: 5000 [IU] via SUBCUTANEOUS
  Filled 2021-11-20: qty 1

## 2021-11-20 MED ORDER — ONDANSETRON HCL 4 MG PO TABS: 4.0000 mg | ORAL_TABLET | Freq: Four times a day (QID) | ORAL | Status: DC | PRN

## 2021-11-20 MED ORDER — ACETAMINOPHEN 325 MG PO TABS
650.0000 mg | ORAL_TABLET | Freq: Four times a day (QID) | ORAL | Status: DC | PRN
  Administered 2021-11-21: 650 mg via ORAL
  Filled 2021-11-20: qty 2

## 2021-11-20 MED ORDER — DULOXETINE HCL 30 MG PO CPEP
30.0000 mg | ORAL_CAPSULE | Freq: Every day | ORAL | Status: DC
  Administered 2021-11-21 – 2021-11-22 (×2): 30 mg via ORAL
  Filled 2021-11-20 (×2): qty 1

## 2021-11-20 MED ORDER — ENOXAPARIN SODIUM 30 MG/0.3ML IJ SOSY: 30.0000 mg | PREFILLED_SYRINGE | INTRAMUSCULAR | Status: DC

## 2021-11-20 MED ORDER — POLYETHYLENE GLYCOL 3350 17 G PO PACK: 17.0000 g | PACK | Freq: Every day | ORAL | Status: DC | PRN

## 2021-11-20 MED ORDER — ACETAMINOPHEN 650 MG RE SUPP: 650.0000 mg | Freq: Four times a day (QID) | RECTAL | Status: DC | PRN

## 2021-11-20 MED ORDER — SODIUM CHLORIDE 0.9 % IV SOLN
2.0000 g | INTRAVENOUS | Status: DC
  Administered 2021-11-20: 2 g via INTRAVENOUS
  Filled 2021-11-20 (×2): qty 20

## 2021-11-20 MED ORDER — LORAZEPAM 2 MG/ML IJ SOLN: 1.0000 mg | Freq: Four times a day (QID) | INTRAMUSCULAR | Status: DC | PRN

## 2021-11-20 MED ORDER — ONDANSETRON HCL 4 MG/2ML IJ SOLN: 4.0000 mg | Freq: Four times a day (QID) | INTRAMUSCULAR | Status: DC | PRN

## 2021-11-20 MED ORDER — TRAMADOL HCL 50 MG PO TABS
50.0000 mg | ORAL_TABLET | Freq: Every day | ORAL | Status: DC | PRN
  Administered 2021-11-21: 50 mg via ORAL
  Filled 2021-11-20: qty 1

## 2021-11-20 MED ORDER — SODIUM CHLORIDE 0.9 % IV BOLUS (SEPSIS)
1000.0000 mL | Freq: Once | INTRAVENOUS | Status: AC
  Administered 2021-11-20: 1000 mL via INTRAVENOUS

## 2021-11-20 MED ORDER — OXYBUTYNIN CHLORIDE 5 MG PO TABS
5.0000 mg | ORAL_TABLET | Freq: Every day | ORAL | Status: DC
  Administered 2021-11-21 – 2021-11-22 (×2): 5 mg via ORAL
  Filled 2021-11-20 (×2): qty 1

## 2021-11-20 MED ORDER — GUAIFENESIN ER 600 MG PO TB12
600.0000 mg | ORAL_TABLET | Freq: Two times a day (BID) | ORAL | Status: DC | PRN
  Administered 2021-11-21 (×2): 600 mg via ORAL
  Filled 2021-11-20 (×2): qty 1

## 2021-11-20 NOTE — ED Provider Notes (Signed)
? ?[facility] ?Provider Note ? ? ? Event Date/Time  ? First MD Initiated Contact with Patient 11/20/21 1901   ?  (approximate) ? ? ?History  ? ?Shortness of Breath ? ? ?HPI ? ?Carly Abbott is a 66 y.o. female with a history of multiple sclerosis and hypertension who comes ED complaining of shortness of breath and cough for the past week, gradual onset and worsening.  Shortness of breath is worse with ambulation, better with resting.  No chest pain.  No pleuritic symptoms.  Denies dizziness or syncope.  Denies any pain whatsoever. ?She does report some weight loss over the last week due to poor oral intake for malaise. ? ?  ? ? ?Physical Exam  ? ?Triage Vital Signs: ?ED Triage Vitals  ?Enc Vitals Group  ?   BP 11/20/21 1902 125/73  ?   Pulse Rate 11/20/21 1920 (!) 106  ?   Resp 11/20/21 1902 (!) 28  ?   Temp 11/20/21 1902 98.4 ?F (36.9 ?C)  ?   Temp Source 11/20/21 1902 Oral  ?   SpO2 11/20/21 1920 98 %  ?   Weight 11/20/21 2032 85 lb (38.6 kg)  ?   Height --   ?   Head Circumference --   ?   Peak Flow --   ?   Pain Score 11/20/21 1904 0  ?   Pain Loc --   ?   Pain Edu? --   ?   Excl. in [place]? --   ? ? ?Most recent vital signs: ?Vitals:  ? 11/20/21 1920 11/20/21 2046  ?BP:  101/76  ?Pulse: (!) 106 100  ?Resp:  20  ?Temp:  98.1 ?F (36.7 ?C)  ?SpO2: 98% 100%  ? ? ? ?General: Awake, no distress.  Chronically ill-appearing. ?CV:  Good peripheral perfusion.  Tachycardia heart rate 105 ?Resp:  Normal effort.  Clear to auscultation bilaterally, no wheezes or crackles ?Abd:  No distention.  Soft and nontender ?Other:  No lower extremity edema or calf tenderness.  No inflammatory soft tissue changes. ? ? ?ED Results / Procedures / Treatments  ? ?Labs ?(all labs ordered are listed, but only abnormal results are displayed) ?Labs Reviewed  ?COMPREHENSIVE METABOLIC PANEL - Abnormal; Notable for the following components:  ?    Result Value  ? Sodium 133 (*)   ? Potassium 3.4 (*)   ? Calcium 8.5 (*)   ? Albumin  2.8 (*)   ? All other components within normal limits  ?CBC WITH DIFFERENTIAL/PLATELET - Abnormal; Notable for the following components:  ? Platelets 458 (*)   ? Neutro Abs 8.1 (*)   ? All other components within normal limits  ?RESP PANEL BY RT-PCR (FLU A&B, COVID) [id number]  ?CULTURE, BLOOD (ROUTINE X 2)  ?CULTURE, BLOOD (ROUTINE X 2)  ?URINE CULTURE  ?LACTIC ACID, PLASMA  ?PROTIME-INR  ?APTT  ?PROCALCITONIN  ?URINALYSIS, COMPLETE (UACMP) WITH MICROSCOPIC  ?HIV ANTIBODY (ROUTINE TESTING W REFLEX)  ?CORTISOL-AM, BLOOD  ?MAGNESIUM  ?PHOSPHORUS  ? ? ? ?EKG ? ? ? ? ?RADIOLOGY ?Chest x-ray viewed interpreted by me, shows right-sided pleural effusion and infiltrate consistent with pneumonia.  Radiology report reviewed. ? ? ? ? ?PROCEDURES: ? ?Critical Care performed: Yes, see critical care procedure note(s) ? ?.Critical Care ?Performed by: Carrie Mew, MD ?Authorized by: Carrie Mew, MD  ? ?Critical care provider statement:  ?  Critical care time (minutes):  35 ?  Critical care time was exclusive of:  Separately  billable procedures and treating other patients ?  Critical care was necessary to treat or prevent imminent or life-threatening deterioration of the following conditions:  Sepsis and respiratory failure ?  Critical care was time spent personally by me on the following activities:  Development of treatment plan with patient or surrogate, discussions with consultants, evaluation of patient's response to treatment, examination of patient, obtaining history from patient or surrogate, ordering and performing treatments and interventions, ordering and review of laboratory studies, ordering and review of radiographic studies, pulse oximetry, re-evaluation of patient's condition and review of old charts ?  Care discussed with: admitting provider   ? ? ?MEDICATIONS ORDERED IN ED: ?Medications  ?cefTRIAXone (ROCEPHIN) 2 g in sodium chloride 0.9 % 100 mL IVPB (0 g Intravenous Stopped 11/20/21 2005)  ?azithromycin  (ZITHROMAX) 500 mg in sodium chloride 0.9 % 250 mL IVPB (500 mg Intravenous New Bag/Given 11/20/21 2000)  ?acetaminophen (TYLENOL) tablet 650 mg (has no administration in time range)  ?  Or  ?acetaminophen (TYLENOL) suppository 650 mg (has no administration in time range)  ?ondansetron (ZOFRAN) tablet 4 mg (has no administration in time range)  ?  Or  ?ondansetron (ZOFRAN) injection 4 mg (has no administration in time range)  ?0.9 %  sodium chloride infusion (has no administration in time range)  ?polyethylene glycol (MIRALAX / GLYCOLAX) packet 17 g (has no administration in time range)  ?heparin injection 5,000 Units (has no administration in time range)  ?albuterol (PROVENTIL) (2.5 MG/3ML) 0.083% nebulizer solution 2.5 mg (has no administration in time range)  ?sodium chloride 0.9 % bolus 1,000 mL (1,000 mLs Intravenous New Bag/Given 11/20/21 1925)  ? ? ? ?IMPRESSION / MDM / ASSESSMENT AND PLAN / ED COURSE  ?I reviewed the triage vital signs and the nursing notes. ?             ?               ? ?Differential diagnosis includes, but is not limited to, pneumonia, pleural effusion, pulmonary edema, pneumothorax, AKI ? ?Patient presents with shortness of breath, hypoxia to 88% on room air, fever tachycardia tachypnea, concerning for pneumonia and sepsis.  Chest x-ray is positive for infiltrate and effusion.  Labs are reassuring with a normal lactate.  Patient received Rocephin and azithromycin.  No signs of shock.  Case discussed with hospitalist for further management of hypoxic respiratory failure.. ? ? ?  ? ? ?FINAL CLINICAL IMPRESSION(S) / ED DIAGNOSES  ? ?Final diagnoses:  ?Community acquired pneumonia, unspecified laterality  ?Acute respiratory failure with hypoxia ([place])  ? ? ? ?Rx / DC Orders  ? ?ED Discharge Orders   ? ? None  ? ?  ? ? ? ?Note:  This document was prepared using Dragon voice recognition software and may include unintentional dictation errors. ?  ?Carrie Mew, MD ?11/20/21 2108 ? ?

## 2021-11-20 NOTE — Assessment & Plan Note (Signed)
-   Per med reconciliation and patient report, she is no longer taking statin medications ?

## 2021-11-20 NOTE — Assessment & Plan Note (Addendum)
-   Ultrasound guided thoracentesis has been ordered with labs and with LDH in the serum for tomorrow ?

## 2021-11-20 NOTE — Sepsis Progress Note (Signed)
Following per sepsis protocol   

## 2021-11-20 NOTE — Hospital Course (Signed)
Carly Abbott is a 66 year old female with history of depression with anxiety, multiple sclerosis, vestibular schwannoma, hypertension, history of hyperlipidemia, who presents emergency department for chief concerns of cough and shortness of breath. ? ?Initial vitals in the emergency department showed temperature of 98.4, respiration rate of 26, heart rate of 106, blood pressure 125/73, SPO2 of 98% on room air. ? ?Serum sodium 133, potassium 3.4, chloride 99, bicarb 23, BUN of 14, serum creatinine of 0.62, GFR greater than 60, nonfasting blood glucose 92, WBC 9.4, hemoglobin 13.7, platelets of 458. ? ?Procalcitonin was less than 0.10, lactic acid was 1.8. ? ?Blood cultures have been collected and are in process. ? ?ED treatment: Azithromycin 500 mg IV for 5 days, ceftriaxone 2 g IV for 5 days, sodium chloride 1 L bolus. ?

## 2021-11-20 NOTE — Assessment & Plan Note (Addendum)
-   Right middle lobe pneumonia ?- Check procalcitonin ?- Continue azithromycin and ceftriaxone per EDP ?- Patient meets sepsis criteria with elevated heart rate and respiration rate with source of pneumonia however is not presenting as sepsis clinically ?- Incentive spirometry and flutter valve every 2 hours while awake ordered ?

## 2021-11-20 NOTE — Assessment & Plan Note (Addendum)
-   Patient takes amlodipine 5 mg daily, this has been resumed ?

## 2021-11-20 NOTE — Consult Note (Signed)
CODE SEPSIS - PHARMACY COMMUNICATION ? ?**Broad Spectrum Antibiotics should be administered within 1 hour of Sepsis diagnosis** ? ?Time Code Sepsis Called/Page Received: 1921 ? ?Antibiotics Ordered: Azithro, Rocephin ? ?Time of 1st antibiotic administration: 1925 ? ?Additional action taken by pharmacy: none ? ?If necessary, Name of Provider/Nurse Contacted: n/a ? ? ? ?Pearla Dubonnet ,PharmD ?Clinical Pharmacist  ?11/20/2021  8:40 PM ? ?

## 2021-11-20 NOTE — H&P (Signed)
?History and Physical  ? ?Carly Abbott WNU:[id number] DOB: 04-16-1956 DOA: 11/20/2021 ? ?PCP: Dion Body, MD  ?Outpatient Specialists: Dr. Melrose Nakayama, neurology ?Patient coming from: Home via EMS ? ?I have personally briefly reviewed patient's old medical records in [place]. ? ?Chief Concern: Shortness of breath and cough ? ?HPI: Ms. Carly Abbott is a 66 year old female with history of depression with anxiety, multiple sclerosis, vestibular schwannoma, hypertension, history of hyperlipidemia, who presents emergency department for chief concerns of cough and shortness of breath. ? ?Initial vitals in the emergency department showed temperature of 98.4, respiration rate of 26, heart rate of 106, blood pressure 125/73, SPO2 of 98% on room air. ? ?Serum sodium 133, potassium 3.4, chloride 99, bicarb 23, BUN of 14, serum creatinine of 0.62, GFR greater than 60, nonfasting blood glucose 92, WBC 9.4, hemoglobin 13.7, platelets of 458. ? ?Procalcitonin was less than 0.10, lactic acid was 1.8. ? ?Blood cultures have been collected and are in process. ? ?ED treatment: Azithromycin 500 mg IV for 5 days, ceftriaxone 2 g IV for 5 days, sodium chloride 1 L bolus. ? ?At bedside patient is AAOx3.  She endorses shortness of breath that has been ongoing for the last 2 weeks.  She finally consented to having EMS come because she was going out today with her friends and she was not able to ambulate without increased respiratory effort and her friend convinced her that she needed to go to the emergency department for further evaluation.   ? ?She endorses coughing, that is sporadically productive with yellow sputum.  ? ?She denies fever, known sick contact, chest pain, abdominal pain, dysuria, diarrhea, constipation. ? ?She endorses weight loss, about seven pounds in about one month. She endorses poor appetite.  ? ?Social history: She lives by herself. She denies etoh, recreational drug, tobacco use. She is retired and was formerly  an Therapist, sports.  ? ?Vaccination history: She is vaccinated for covid 19 and influenza.  ? ?ROS: ?Constitutional: + weight change, no fever ?ENT/Mouth: no sore throat, no rhinorrhea ?Eyes: no eye pain, no vision changes ?Cardiovascular: no chest pain, + dyspnea,  no edema, no palpitations ?Respiratory: + cough, + sputum, no wheezing ?Gastrointestinal: no nausea, no vomiting, no diarrhea, no constipation ?Genitourinary: no urinary incontinence, no dysuria, no hematuria ?Musculoskeletal: no arthralgias, no myalgias ?Skin: no skin lesions, no pruritus, ?Neuro: + weakness, no loss of consciousness, no syncope ?Psych: no anxiety, no depression, + decrease appetite ?Heme/Lymph: no bruising, no bleeding ? ?ED Course: Discussed with emergency medicine provider, patient requiring hospitalization for chief concerns of meeting sepsis criteria secondary to pneumonia. ? ?Assessment/Plan ? ?Principal Problem: ?  Community acquired pneumonia ?Active Problems: ?  MS (multiple sclerosis) ([place]) ?  Essential hypertension ?  Hyperlipidemia ?  Anxiety and depression ?  Pleural effusion on right ?  ?Assessment and Plan: ? ?* Community acquired pneumonia ?- Right middle lobe pneumonia ?- Check procalcitonin ?- Continue azithromycin and ceftriaxone per EDP ?- Patient meets sepsis criteria with elevated heart rate and respiration rate with source of pneumonia however is not presenting as sepsis clinically ?- Incentive spirometry and flutter valve every 2 hours while awake ordered ? ?Pleural effusion on right ?- Ultrasound guided thoracentesis has been ordered with labs and with LDH in the serum for tomorrow ? ?Anxiety and depression ?- Patient takes duloxetine 30 mg daily- Suspect this is secondary to p, this has been resumed ?- Ativan 1 mg IV every 6 hours as needed for anxiety, 3 doses  ordered ? ?Hyperlipidemia ?- Per med reconciliation and patient report, she is no longer taking statin medications ? ?Essential hypertension ?- Patient takes  amlodipine 5 mg daily, this has been resumed ? ? ?DVT prophylaxis-I ordered heparin 5000 units subcutaneous one-time dose due to anticipation of ultrasound-guided thoracentesis in the a.m. ?- AM team to reinitiate pharmacologic DVT prophylaxis when the benefits outweigh the risk ? ?Chart reviewed.  ? ?DVT prophylaxis: Heparin 5000 units subcutaneous one-time dose ordered ?Code Status: Full code ?Diet: Heart healthy ?Family Communication: Updated friend at bedside with patient's permission ?Disposition Plan: Pending clinical course ?Consults called: IR ?Admission status: Telemetry medical, observation ? ?Past Medical History:  ?Diagnosis Date  ? Depression   ? Glaucoma   ? Headache   ? Hyperlipidemia   ? Multiple sclerosis ([place])   ? Spinal stenosis   ? ?Past Surgical History:  ?Procedure Laterality Date  ? BACK SURGERY    ? CARPAL TUNNEL RELEASE    ? CHOLECYSTECTOMY    ? COLONOSCOPY    ? COLONOSCOPY WITH PROPOFOL N/A 05/18/2016  ? Procedure: COLONOSCOPY WITH PROPOFOL;  Surgeon: Manya Silvas, MD;  Location: [facility];  Service: Endoscopy;  Laterality: N/A;  ? LEG ANGIOGRAPHY    ? TONSILLECTOMY    ? ?Social History:  reports that she has never smoked. She has never used smokeless tobacco. She reports that she does not drink alcohol and does not use drugs. ? ?Allergies  ?Allergen Reactions  ? Cardizem [Diltiazem Hcl] Itching  ? Other Hives  ?  [place]   ? ?Family History  ?Problem Relation Age of Onset  ? Breast cancer Neg Hx   ? ?Family history: Family history reviewed and not pertinent. ? ?Prior to Admission medications   ?Medication Sig Start Date End Date Taking? Authorizing Provider  ?acetaminophen (TYLENOL) 500 MG tablet Take 500 mg by mouth every 6 (six) hours as needed.    [provider]  ?amLODipine (NORVASC) 5 MG tablet Take 1 tablet (5 mg total) by mouth daily. 09/30/21 12/29/21  Kris Hartmann, NP  ?atorvastatin (LIPITOR) 10 MG tablet Take 10 mg by mouth daily. ?Patient not taking:  Reported on 09/30/2021    [provider]  ?cyclobenzaprine (FLEXERIL) 10 MG tablet Take 10 mg by mouth 3 (three) times daily as needed for muscle spasms. ?Patient not taking: Reported on 09/30/2021    [provider]  ?diazepam (VALIUM) 5 MG tablet Take 5 mg by mouth every 6 (six) hours as needed for anxiety. ?Patient not taking: Reported on 09/30/2021    [provider]  ?DULoxetine (CYMBALTA) 30 MG capsule Take 30 mg by mouth daily. 09/19/21   [provider]  ?Interferon Beta-1a 30 MCG/0.5ML AJKT Inject into the muscle. ?Patient not taking: Reported on 09/30/2021    [provider]  ?latanoprost (XALATAN) 0.005 % ophthalmic solution 1 drop at bedtime.    [provider]  ? ?Physical Exam: ?Vitals:  ? 11/20/21 2046 11/20/21 2143 11/20/21 2159 11/20/21 2336  ?BP: 101/76 115/72 115/72 106/65  ?Pulse: 100 95 95 92  ?Resp: 20 15 15 17   ?Temp: 98.1 ?F (36.7 ?C) 98.6 ?F (37 ?C) 98.6 ?F (37 ?C) 98.2 ?F (36.8 ?C)  ?TempSrc: Oral Oral Oral   ?SpO2: 100% 99%  100%  ?Weight:   38.6 kg   ?Height:   4\' 10"  (1.473 m)   ? ?Constitutional: appears age-appropriate, NAD, calm, comfortable ?Eyes: PERRL, lids and conjunctivae normal ?ENMT: Mucous membranes are moist. Posterior pharynx clear of  any exudate or lesions. Age-appropriate dentition. Hearing appropriate. ?Neck: normal, supple, no masses, no thyromegaly ?Respiratory: clear to auscultation bilaterally, no wheezing, no crackles. Normal respiratory effort. No accessory muscle use.  ?Cardiovascular: Regular rate and rhythm, no murmurs / rubs / gallops. No extremity edema. 2+ pedal pulses. No carotid bruits.  ?Abdomen: no tenderness, no masses palpated, no hepatosplenomegaly. Bowel sounds positive.  ?Musculoskeletal: no clubbing / cyanosis. No joint deformity upper and lower extremities. Good ROM, no contractures, no atrophy. Normal muscle tone.  ?Skin: no rashes, lesions, ulcers. No induration ?Neurologic: Sensation intact.  Strength 5/5 in all 4.  ?Psychiatric: Normal judgment and insight. Alert and oriented x 3. Normal mood.  ? ?EKG: Ordered ? ?Chest x-ray on Admission: I personally reviewed and I agree with radiologist reading as below. ?

## 2021-11-20 NOTE — ED Triage Notes (Signed)
Pt presents to ED with c/o of SOB, sore throat and cough for the past week.  ?

## 2021-11-20 NOTE — Assessment & Plan Note (Addendum)
-   Patient takes duloxetine 30 mg daily- Suspect this is secondary to p, this has been resumed ?- Ativan 1 mg IV every 6 hours as needed for anxiety, 3 doses ordered ?

## 2021-11-21 ENCOUNTER — Observation Stay: Payer: Medicare Other

## 2021-11-21 DIAGNOSIS — I10 Essential (primary) hypertension: Secondary | ICD-10-CM | POA: Diagnosis not present

## 2021-11-21 DIAGNOSIS — E782 Mixed hyperlipidemia: Secondary | ICD-10-CM | POA: Diagnosis not present

## 2021-11-21 DIAGNOSIS — J90 Pleural effusion, not elsewhere classified: Secondary | ICD-10-CM | POA: Diagnosis not present

## 2021-11-21 DIAGNOSIS — G35 Multiple sclerosis: Secondary | ICD-10-CM | POA: Diagnosis not present

## 2021-11-21 LAB — BODY FLUID CELL COUNT WITH DIFFERENTIAL
Eos, Fluid: 0 %
Lymphs, Fluid: 21 %
Monocyte-Macrophage-Serous Fluid: 72 %
Neutrophil Count, Fluid: 7 %
Total Nucleated Cell Count, Fluid: 964 cu mm

## 2021-11-21 LAB — HIV ANTIBODY (ROUTINE TESTING W REFLEX): HIV Screen 4th Generation wRfx: NONREACTIVE

## 2021-11-21 LAB — CORTISOL-AM, BLOOD: Cortisol - AM: 20.8 ug/dL (ref 6.7–22.6)

## 2021-11-21 LAB — PROTEIN, PLEURAL OR PERITONEAL FLUID: Total protein, fluid: 3.3 g/dL

## 2021-11-21 LAB — LACTATE DEHYDROGENASE, PLEURAL OR PERITONEAL FLUID: LD, Fluid: 127 U/L — ABNORMAL HIGH (ref 3–23)

## 2021-11-21 LAB — LACTATE DEHYDROGENASE: LDH: 181 U/L (ref 98–192)

## 2021-11-21 MED ORDER — ADULT MULTIVITAMIN W/MINERALS CH
1.0000 | ORAL_TABLET | Freq: Every day | ORAL | Status: DC
  Administered 2021-11-21 – 2021-11-22 (×2): 1 via ORAL
  Filled 2021-11-21 (×2): qty 1

## 2021-11-21 MED ORDER — CYCLOBENZAPRINE HCL 10 MG PO TABS
10.0000 mg | ORAL_TABLET | Freq: Two times a day (BID) | ORAL | Status: DC
  Administered 2021-11-21 – 2021-11-22 (×4): 10 mg via ORAL
  Filled 2021-11-21 (×4): qty 1

## 2021-11-21 MED ORDER — OYSTER SHELL CALCIUM/D3 500-5 MG-MCG PO TABS
1.0000 | ORAL_TABLET | Freq: Two times a day (BID) | ORAL | Status: DC
  Administered 2021-11-21 – 2021-11-22 (×3): 1 via ORAL
  Filled 2021-11-21 (×3): qty 1

## 2021-11-21 MED ORDER — ENSURE ENLIVE PO LIQD
237.0000 mL | Freq: Three times a day (TID) | ORAL | Status: DC
  Administered 2021-11-21 – 2021-11-22 (×3): 237 mL via ORAL

## 2021-11-21 MED ORDER — TRAMADOL HCL 50 MG PO TABS
50.0000 mg | ORAL_TABLET | Freq: Once | ORAL | Status: AC
  Administered 2021-11-21: 50 mg via ORAL
  Filled 2021-11-21: qty 1

## 2021-11-21 MED ORDER — LATANOPROST 0.005 % OP SOLN
1.0000 [drp] | Freq: Every day | OPHTHALMIC | Status: DC
  Administered 2021-11-21: 1 [drp] via OPHTHALMIC
  Filled 2021-11-21: qty 2.5

## 2021-11-21 NOTE — Progress Notes (Signed)
[place] [facility] ? ? ?PATIENT NAME: Carly Abbott   ? ?MR#:  [id number] ? ?DATE OF BIRTH:  [birth year] ? ?SUBJECTIVE:  ? ?patient came in with shortness of breath increasing for last four weeks. Has history of smoking for 30 years. Underwent thoracentesis yesterday. Has some mild cough. No fever. ? ? ?VITALS:  ?Blood pressure 98/64, pulse 99, temperature 97.9 ?F (36.6 ?C), resp. rate 18, height 4\' 10"  (1.473 m), weight 38.6 kg, SpO2 96 %. ? ?PHYSICAL EXAMINATION:  ? ?GENERAL:  66 y.o.-year-old patient lying in the bed with no acute distress. Thin, frail ?LUNGS: decreased l breath sounds bilaterally, no wheezing, rales, rhonchi.  ?CARDIOVASCULAR: S1, S2 normal. No murmurs ?ABDOMEN: Soft, nontender, nondistended. Bowel sounds present.  ?EXTREMITIES: No  edema b/l.    ?NEUROLOGIC: nonfocal  patient is alert and awake ?  ? ?LABORATORY PANEL:  ?CBC ?Recent Labs  ?Lab 11/20/21 ?1906  ?WBC 9.4  ?HGB 13.7  ?HCT 41.6  ?PLT 458*  ? ? ?Chemistries  ?Recent Labs  ?Lab 11/20/21 ?1906 11/20/21 ?2145  ?NA 133*  --   ?K 3.4*  --   ?CL 99  --   ?CO2 23  --   ?GLUCOSE 92  --   ?BUN 14  --   ?CREATININE 0.68  --   ?CALCIUM 8.5*  --   ?MG  --  1.8  ?AST 23  --   ?ALT 11  --   ?ALKPHOS 92  --   ?BILITOT 0.7  --   ? ?Cardiac Enzymes ?No results for input(s): TROPONINI in the last 168 hours. ?RADIOLOGY:  ?DG Chest 2 View ? ?Result Date: 11/20/2021 ?CLINICAL DATA:  Possible sepsis with shortness of breath and cough, initial encounter EXAM: CHEST - 2 VIEW COMPARISON:  None Available. FINDINGS: Cardiac shadow is at the upper limits of normal in size. Lungs are hyperinflated. Right middle lobe pneumonia is seen with associated pleural effusion. No other focal abnormality is seen. IMPRESSION: Right middle lobe pneumonia with associated right-sided effusion. Electronically Signed   By: Inez Catalina M.D.   On: 11/20/2021 19:59  ? ?DG Chest Port 1 View ? ?Result Date: 11/21/2021 ?CLINICAL DATA:  Status post right  pleural effusion. EXAM: PORTABLE CHEST 1 VIEW COMPARISON:  Chest x-ray from yesterday. FINDINGS: Stable cardiomediastinal silhouette. Decreased now trace right pleural effusion status post thoracentesis. Somewhat improved aeration at the right lung base with residual patchy consolidation and interstitial opacity in the right middle lobe. New trace left pleural effusion. No pneumothorax. No acute osseous abnormality. IMPRESSION: 1. Decreased now trace right pleural effusion status post thoracentesis. No pneumothorax. 2. Somewhat improved aeration at the right lung base with residual patchy consolidation and interstitial opacity in the right middle lobe suspicious for pneumonia. Followup PA and lateral chest X-ray is recommended in 3-4 weeks following trial of antibiotic therapy to ensure resolution. 3. New trace left pleural effusion. Electronically Signed   By: Titus Dubin M.D.   On: 11/21/2021 11:28  ? ?US THORACENTESIS ASP PLEURAL SPACE W/IMG GUIDE ? ?Result Date: 11/21/2021 ?INDICATION: Patient admitted with right middle lobe pneumonia and found to have right-sided pleural effusion. Interventional radiology asked to perform a diagnostic and therapeutic thoracentesis. EXAM: ULTRASOUND GUIDED THORACENTESIS MEDICATIONS: 1% lidocaine 10 mL COMPLICATIONS: None immediate. PROCEDURE: An ultrasound guided thoracentesis was thoroughly discussed with the patient and questions answered. The benefits, risks, alternatives and complications were also discussed. The patient understands and wishes to proceed with the procedure. Written consent  was obtained. Ultrasound was performed to localize and mark an adequate pocket of fluid in the right chest. The area was then prepped and draped in the normal sterile fashion. 1% Lidocaine was used for local anesthesia. Under ultrasound guidance a 6 Fr Safe-T-Centesis catheter was introduced. Thoracentesis was performed. The catheter was removed and a dressing applied. FINDINGS: A total  of approximately 1.2 L of clear yellow fluid was removed. Samples were sent to the laboratory as requested by the clinical team. IMPRESSION: Successful ultrasound guided right thoracentesis yielding 1.2 L of pleural fluid. Read by: Soyla Dryer, NP Electronically Signed   By: Miachel Roux M.D.   On: 11/21/2021 11:50   ? ?Assessment and Plan ? ?Ms. Carly Abbott is a 66 year old female with history of depression with anxiety, multiple sclerosis, vestibular schwannoma, hypertension, history of hyperlipidemia, who presents emergency department for chief concerns of cough and shortness of breath for 4-5 weeks. ? ?Right-sided malignant pleural effusion ?-- patient underwent thoracentesis and 1.2 L fluid removed ?-- pathologist Dr. Reuel Derby-- preliminary report shows positive malignant cells ?-- patient has history of smoking for 30 years. Chest x-ray consistent with emphysema. Right lower lobe fullness?mass ?-- oncology consultation with Dr. Tasia Catchings ?-- discussed preliminary results with patient ?-- discontinue IV antibiotics since clinically does not have pneumonia ? ?anxiety depression ?-- continue home meds ? ?Hyperlipidemia ?-- patient does not take statins any longer. She tells me she was taken off by Dr. Netty Starring ? ?Hypertension ?-- continue amlodipine ? ?history of MS with foot drop ?-- patient follows with Dr. Melrose Nakayama neurology ? ? ? ? ? ?Procedures: right thoracentesis ?Family communication : friend Juliann Pulse ?Consults : oncology ?CODE STATUS: full ?DVT Prophylaxis : heparin ?Level of care: Telemetry Medical ?Status is: Observation ?The patient remains OBS appropriate and will d/c before 2 midnights. ?  ? ?TOTAL TIME TAKING CARE OF THIS PATIENT: 30 minutes.  ?>50% time spent on counselling and coordination of care ? ?Note: This dictation was prepared with Dragon dictation along with smaller phrase technology. Any transcriptional errors that result from this process are unintentional. ? ?Fritzi Mandes M.D  ? ? ?[facility]  ? ?CC: ?Primary care physician; Dion Body, MD  ?

## 2021-11-21 NOTE — Procedures (Signed)
PROCEDURE SUMMARY: ? ?Successful US guided right thoracentesis. ?Yielded 1.2 L of clear yellow fluid. ?Pt tolerated procedure well. ?No immediate complications. ? ?Specimen sent for labs. ?CXR ordered; no post-procedure pneumothorax identified.  ? ?EBL < 2 mL ? ?Theresa Duty,  NP ?11/21/2021 ?11:37 AM ? ? ? ?

## 2021-11-21 NOTE — Progress Notes (Signed)
Patients family refused tech for 8am vitals ?

## 2021-11-21 NOTE — Progress Notes (Signed)
?   11/21/21 0900  ?Clinical Encounter Type  ?Visited With Family  ?Visit Type Initial  ?Referral From Nurse  ? ?Chaplain followed up on [facility] consult to provide information on Medical Advance Directive. Information provided and will call Chaplain when necessary. ?

## 2021-11-21 NOTE — Progress Notes (Signed)
Initial Nutrition Assessment ? ?DOCUMENTATION CODES:  ? ?Underweight ? ?INTERVENTION:  ? ?-Ensure Enlive po TID, each supplement provides 350 kcal and 20 grams of protein ?-MVI with minerals daily ?-Liberalize diet to regular, for widest variety of meal selections ? ?NUTRITION DIAGNOSIS:  ? ?Increased nutrient needs related to chronic illness (MS) as evidenced by estimated needs. ? ?GOAL:  ? ?Patient will meet greater than or equal to 90% of their needs ? ?MONITOR:  ? ?PO intake, Supplement acceptance ? ?REASON FOR ASSESSMENT:  ? ?Malnutrition Screening Tool ?  ? ?ASSESSMENT:  ? ?Ms. Carly Abbott is a 66 year old female with history of depression with anxiety, multiple sclerosis, vestibular schwannoma, hypertension, history of hyperlipidemia, who presents emergency department for chief concerns of cough and shortness of breath. ? ?Pt admitted with CAP and rt pleural effusion.  ? ?5/5- s/p rt thoracentesis (1.2 L fluid removed) ? ?Reviewed I/O's: -500 ml x 24 hours  ? ?Pt out of room at time of visit. No family present to provide additional history. RD unable to obtain further nutrition-related history or complete nutrition-focused physical exam at this time.   ? ?Pt currently on a heart healthy diet. No meal completion data available to assess at time of visit.  ? ?Reviewed wt hx; wt has been stable over the past 2 months. Noted distant history of weight loss.  ? ?Highly suspect pt with malnutrition, however, unable to identify at this time. Pt would greatly benefit from addition of oral nutrition supplements.  ? ?Medications reviewed and include calcium-vitamin D.  ? ?Labs reviewed: Na: 133, K: 3.4.   ? ?Diet Order:   ?Diet Order   ? ?       ?  Diet Heart Room service appropriate? Yes; Fluid consistency: Thin  Diet effective now       ?  ? ?  ?  ? ?  ? ? ?EDUCATION NEEDS:  ? ?Education needs have been addressed ? ?Skin:  Skin Assessment: Skin Integrity Issues: ?Skin Integrity Issues:: Incisions ?Incisions: closed rt  posterior back ? ?Last BM:  11/20/21 ? ?Height:  ? ?Ht Readings from Last 1 Encounters:  ?11/20/21 4\' 10"  (1.473 m)  ? ? ?Weight:  ? ?Wt Readings from Last 1 Encounters:  ?11/20/21 38.6 kg  ? ? ?Ideal Body Weight:  43.9 kg ? ?BMI:  Body mass index is 17.77 kg/m?. ? ?Estimated Nutritional Needs:  ? ?Kcal:  1550-1750 ? ?Protein:  80-95 grams ? ?Fluid:  > 1.5 L ? ? ? ?Loistine Chance, RD, LDN, CDCES ?Registered Dietitian II ?Certified Diabetes Care and Education Specialist ?Please refer to [facility] for RD and/or RD on-call/weekend/after hours pager  ?

## 2021-11-21 NOTE — Progress Notes (Addendum)
1304 ?O2 weaned from 2L to 0.5L O2 sat 96%. Pt still a little SOB ?

## 2021-11-21 NOTE — Plan of Care (Signed)

## 2021-11-21 NOTE — Plan of Care (Signed)
INITIAL ADD ? ? ?Problem: Education: ?Goal: Knowledge of General Education information will improve ?Description: Including pain rating scale, medication(s)/side effects and non-pharmacologic comfort measures ?Outcome: Not Applicable ?  ?Problem: Health Behavior/Discharge Planning: ?Goal: Ability to manage health-related needs will improve ?Outcome: Not Applicable ?  ?Problem: Clinical Measurements: ?Goal: Ability to maintain clinical measurements within normal limits will improve ?Outcome: Not Applicable ?Goal: Will remain free from infection ?Outcome: Not Applicable ?Goal: Diagnostic test results will improve ?Outcome: Not Applicable ?Goal: Respiratory complications will improve ?Outcome: Not Applicable ?Goal: Cardiovascular complication will be avoided ?Outcome: Not Applicable ?  ?Problem: Activity: ?Goal: Risk for activity intolerance will decrease ?Outcome: Not Applicable ?  ?Problem: Nutrition: ?Goal: Adequate nutrition will be maintained ?Outcome: Not Applicable ?  ?Problem: Coping: ?Goal: Level of anxiety will decrease ?Outcome: Not Applicable ?  ?Problem: Elimination: ?Goal: Will not experience complications related to bowel motility ?Outcome: Not Applicable ?Goal: Will not experience complications related to urinary retention ?Outcome: Not Applicable ?  ?Problem: Pain Managment: ?Goal: General experience of comfort will improve ?Outcome: Not Applicable ?  ?Problem: Safety: ?Goal: Ability to remain free from injury will improve ?Outcome: Not Applicable ?  ?Problem: Skin Integrity: ?Goal: Risk for impaired skin integrity will decrease ?Outcome: Not Applicable ?  ?

## 2021-11-21 NOTE — Progress Notes (Addendum)
0829 ?Dr Posey Pronto informed nurse pt does not need to be NPO for thoracentesis but to hold all blood thinners this morning if ordered.  ? ?1006 ?Pt SOB RT in to give pt prn neb treatment ? ?1024 ?Transport in to take pt down to [place] at this time for thoracentesis  ? ?1138 ?Pt returned to room from [place]. VSS ?

## 2021-11-21 NOTE — Evaluation (Signed)
Physical Therapy Evaluation ?Patient Details ?Name: Carly Abbott ?MRN: [id number] ?DOB: May 24, 1956 ?Today's Date: 11/21/2021 ? ?History of Present Illness ? Ms. Lajuana Patchell is a 66 year old female with history of depression with anxiety, multiple sclerosis, vestibular schwannoma, hypertension, history of hyperlipidemia, who presents emergency department for chief concerns of cough and shortness of breath. ?  ?Clinical Impression ? Pt admitted with above diagnosis. Pt received upright in bed agreeable to PT.  Resting on 0.5 L/min with SPO2 at 90-92%. Pt reports at baseline she is indep with household distances, driving and indep walking short community distances. Pt prepares microwave meals and has groceries delivered to door. Pt mod-I with bed mobility with increased time and supervision to stand with noted AP sway/unsteadiness. Pt becomes SOB with SPO2 desat to 86% requiring seated rest for PLB and recovery. After 2 min pt standing to RW on 2L/min maintaining SPO2 >90% with mobility. Pt able to amb without unsteadiness with decreased ankle DF on L foot noted due to hx of MS but pt able to adequately clear foot with hip flexion. Ankle DF is 2/5 MMT noted in supine position prior to standing. Pt able to asc/desc 4 stairs with step to pattern and BUE on single rail but did rely on seated rest prior. Pt demonstrates increased WOB with ambulation and stairs with need for standing rest breaks and seated rest breaks demonstrating increased weakness and decreased endurance with OOB mobility with need for RW. Pt demonstrating with RW, ability to safely return home. Would benefit from [facility] at d/c due to poor endurance, LE weakness, and increased unsteadiness. Pt returned to room in recliner with all needs in reach. Pt currently with functional limitations due to the deficits listed below (see PT Problem List). Pt will benefit from skilled PT to increase their independence and safety with mobility to allow discharge to the venue  listed below.     ? ?Recommendations for follow up therapy are one component of a multi-disciplinary discharge planning process, led by the attending physician.  Recommendations may be updated based on patient status, additional functional criteria and insurance authorization. ? ?Follow Up Recommendations Home health PT ? ?  ?Assistance Recommended at Discharge Intermittent Supervision/Assistance  ?Patient can return home with the following ? A little help with walking and/or transfers;Assist for transportation;Help with stairs or ramp for entrance ? ?  ?Equipment Recommendations Rolling walker (2 wheels)  ?Recommendations for Other Services ?    ?  ?Functional Status Assessment Patient has had a recent decline in their functional status and demonstrates the ability to make significant improvements in function in a reasonable and predictable amount of time.  ? ?  ?Precautions / Restrictions Precautions ?Precautions: Fall ?Restrictions ?Weight Bearing Restrictions: No  ? ?  ? ?Mobility ? Bed Mobility ?Overal bed mobility: Modified Independent ?  ?  ?  ?  ?  ?  ?  ?Patient Response: Cooperative ? ?Transfers ?Overall transfer level: Needs assistance ?Equipment used: Rolling walker (2 wheels) ?Transfers: Sit to/from Stand ?Sit to Stand: Supervision ?  ?  ?  ?  ?  ?  ?  ? ?Ambulation/Gait ?Ambulation/Gait assistance: Supervision ?Gait Distance (Feet): 150 Feet ?Assistive device: Rolling walker (2 wheels) ?Gait Pattern/deviations: Step-to pattern, Decreased dorsiflexion - left ?  ?  ?  ?General Gait Details: slow but reciprocal gait with RW. Utilized chair follow for safety. Heavy reliance on RW for support ? ?Stairs ?Stairs: Yes ?Stairs assistance: Min guard ?Stair Management: One rail Right, Step to pattern,  Sideways ?Number of Stairs: 4 ?General stair comments: safe technique with BUE support on single rail. Good balance. ? ?Wheelchair Mobility ?  ? ?Modified Rankin (Stroke Patients Only) ?  ? ?  ? ?Balance Overall  balance assessment: Needs assistance ?Sitting-balance support: No upper extremity supported, Feet supported ?Sitting balance-Leahy Scale: Fair ?  ?  ?  ?Standing balance-Leahy Scale: Fair ?  ?  ?  ?  ?  ?  ?  ?  ?  ?  ?  ?  ?   ? ? ? ?Pertinent Vitals/Pain Pain Assessment ?Pain Assessment: No/denies pain  ? ? ?Home Living Family/patient expects to be discharged to:: Private residence ?Living Arrangements: Alone ?Available Help at Discharge: Friend(s);Available PRN/intermittently ?Type of Home: House ?Home Access: Stairs to enter ?Entrance Stairs-Rails: Right ?Entrance Stairs-Number of Steps: 3 ?  ?Home Layout: One level ?Home Equipment: Wheelchair - manual ?   ?  ?Prior Function Prior Level of Function : Independent/Modified Independent ?  ?  ?  ?  ?  ?  ?  ?ADLs Comments: relies on groceries delivered to door step ?  ? ? ?Hand Dominance  ?   ? ?  ?Extremity/Trunk Assessment  ? Upper Extremity Assessment ?Upper Extremity Assessment: Overall WFL for tasks assessed ?  ? ?Lower Extremity Assessment ?Lower Extremity Assessment: Generalized weakness;LLE deficits/detail;RLE deficits/detail ?RLE Sensation: history of peripheral neuropathy ?LLE Deficits / Details: L ankle DF 2/5 ?LLE Sensation: history of peripheral neuropathy ?  ? ?Cervical / Trunk Assessment ?Cervical / Trunk Assessment: Normal  ?Communication  ? Communication: No difficulties  ?Cognition Arousal/Alertness: Awake/alert ?Behavior During Therapy: [facility] for tasks assessed/performed ?Overall Cognitive Status: Within Functional Limits for tasks assessed ?  ?  ?  ?  ?  ?  ?  ?  ?  ?  ?  ?  ?  ?  ?  ?  ?  ?  ?  ? ?  ?General Comments General comments (skin integrity, edema, etc.): Desat to 86% on .5 L with standing. Relies on 2 L/min to maintain >90% with mobility ? ?  ?Exercises Other Exercises ?Other Exercises: Role of PT in acute setting, d/c recs, need for RW for balance and energy conservation  ? ?Assessment/Plan  ?  ?PT Assessment Patient needs continued PT  services  ?PT Problem List Decreased strength;Decreased mobility;Decreased activity tolerance;Decreased balance ? ?   ?  ?PT Treatment Interventions DME instruction;Therapeutic exercise;Gait training;Balance training;Stair training;Neuromuscular re-education;Functional mobility training;Therapeutic activities;Patient/family education   ? ?PT Goals (Current goals can be found in the Care Plan section)  ?Acute Rehab PT Goals ?Patient Stated Goal: to go home ?PT Goal Formulation: With patient ?Time For Goal Achievement: 12/05/21 ?Potential to Achieve Goals: Good ? ?  ?Frequency Min 2X/week ?  ? ? ?Co-evaluation   ?  ?  ?  ?  ? ? ?  ?AM-PAC PT "6 Clicks" Mobility  ?Outcome Measure Help needed turning from your back to your side while in a flat bed without using bedrails?: A Little ?Help needed moving from lying on your back to sitting on the side of a flat bed without using bedrails?: A Little ?Help needed moving to and from a bed to a chair (including a wheelchair)?: A Little ?Help needed standing up from a chair using your arms (e.g., wheelchair or bedside chair)?: A Little ?Help needed to walk in hospital room?: A Little ?Help needed climbing 3-5 steps with a railing? : A Little ?6 Click Score: 18 ? ?  ?End of  Session Equipment Utilized During Treatment: Gait belt;Oxygen ?Activity Tolerance: Patient tolerated treatment well ?Patient left: in bed;with call bell/phone within reach;with bed alarm set ?Nurse Communication: Mobility status ?PT Visit Diagnosis: Unsteadiness on feet (R26.81);Other abnormalities of gait and mobility (R26.89);Muscle weakness (generalized) (M62.81) ?  ? ?Time: 4163-8453 ?PT Time Calculation (min) (ACUTE ONLY): 34 min ? ? ?Charges:   PT Evaluation ?$PT Eval Moderate Complexity: 1 Mod ?PT Treatments ?$Gait Training: 8-22 mins ?  ?   ?Salem Caster. Fairly IV, PT, DPT ?Physical Therapist- [place]  ?[facility]  ?11/21/2021, 4:24 PM ? ?

## 2021-11-22 ENCOUNTER — Emergency Department: Payer: Medicare Other

## 2021-11-22 ENCOUNTER — Other Ambulatory Visit: Payer: Self-pay

## 2021-11-22 ENCOUNTER — Encounter: Payer: Self-pay | Admitting: Internal Medicine

## 2021-11-22 ENCOUNTER — Inpatient Hospital Stay
Admission: EM | Admit: 2021-11-22 | Discharge: 2021-11-30 | DRG: 180 | Disposition: A | Discharge status: Other Acute Inpatient Hospital | Payer: Medicare Other | Attending: Internal Medicine | Admitting: Internal Medicine

## 2021-11-22 DIAGNOSIS — D75839 Thrombocytosis, unspecified: Secondary | ICD-10-CM | POA: Diagnosis present

## 2021-11-22 DIAGNOSIS — F32A Depression, unspecified: Secondary | ICD-10-CM | POA: Diagnosis present

## 2021-11-22 DIAGNOSIS — E8809 Other disorders of plasma-protein metabolism, not elsewhere classified: Secondary | ICD-10-CM | POA: Diagnosis present

## 2021-11-22 DIAGNOSIS — R634 Abnormal weight loss: Secondary | ICD-10-CM | POA: Diagnosis present

## 2021-11-22 DIAGNOSIS — R6510 Systemic inflammatory response syndrome (SIRS) of non-infectious origin without acute organ dysfunction: Secondary | ICD-10-CM | POA: Diagnosis present

## 2021-11-22 DIAGNOSIS — G35 Multiple sclerosis: Secondary | ICD-10-CM | POA: Diagnosis not present

## 2021-11-22 DIAGNOSIS — J189 Pneumonia, unspecified organism: Secondary | ICD-10-CM | POA: Diagnosis not present

## 2021-11-22 DIAGNOSIS — F419 Anxiety disorder, unspecified: Secondary | ICD-10-CM | POA: Diagnosis present

## 2021-11-22 DIAGNOSIS — E46 Unspecified protein-calorie malnutrition: Secondary | ICD-10-CM | POA: Diagnosis present

## 2021-11-22 DIAGNOSIS — F17200 Nicotine dependence, unspecified, uncomplicated: Secondary | ICD-10-CM | POA: Diagnosis present

## 2021-11-22 DIAGNOSIS — K5900 Constipation, unspecified: Secondary | ICD-10-CM | POA: Diagnosis present

## 2021-11-22 DIAGNOSIS — J90 Pleural effusion, not elsewhere classified: Secondary | ICD-10-CM | POA: Diagnosis not present

## 2021-11-22 DIAGNOSIS — C3490 Malignant neoplasm of unspecified part of unspecified bronchus or lung: Principal | ICD-10-CM | POA: Diagnosis present

## 2021-11-22 DIAGNOSIS — Z833 Family history of diabetes mellitus: Secondary | ICD-10-CM

## 2021-11-22 DIAGNOSIS — Z8249 Family history of ischemic heart disease and other diseases of the circulatory system: Secondary | ICD-10-CM

## 2021-11-22 DIAGNOSIS — I959 Hypotension, unspecified: Secondary | ICD-10-CM | POA: Diagnosis present

## 2021-11-22 DIAGNOSIS — M62838 Other muscle spasm: Secondary | ICD-10-CM | POA: Diagnosis present

## 2021-11-22 DIAGNOSIS — E43 Unspecified severe protein-calorie malnutrition: Secondary | ICD-10-CM | POA: Diagnosis present

## 2021-11-22 DIAGNOSIS — J432 Centrilobular emphysema: Secondary | ICD-10-CM | POA: Diagnosis present

## 2021-11-22 DIAGNOSIS — J910 Malignant pleural effusion: Secondary | ICD-10-CM | POA: Diagnosis present

## 2021-11-22 DIAGNOSIS — Z9889 Other specified postprocedural states: Secondary | ICD-10-CM

## 2021-11-22 DIAGNOSIS — J9621 Acute and chronic respiratory failure with hypoxia: Secondary | ICD-10-CM | POA: Diagnosis present

## 2021-11-22 DIAGNOSIS — J9601 Acute respiratory failure with hypoxia: Secondary | ICD-10-CM

## 2021-11-22 DIAGNOSIS — I10 Essential (primary) hypertension: Secondary | ICD-10-CM | POA: Diagnosis not present

## 2021-11-22 DIAGNOSIS — Z800 Family history of malignant neoplasm of digestive organs: Secondary | ICD-10-CM

## 2021-11-22 DIAGNOSIS — Z66 Do not resuscitate: Secondary | ICD-10-CM | POA: Diagnosis present

## 2021-11-22 DIAGNOSIS — Z9981 Dependence on supplemental oxygen: Secondary | ICD-10-CM

## 2021-11-22 DIAGNOSIS — E785 Hyperlipidemia, unspecified: Secondary | ICD-10-CM | POA: Diagnosis present

## 2021-11-22 DIAGNOSIS — Z681 Body mass index (BMI) 19 or less, adult: Secondary | ICD-10-CM

## 2021-11-22 DIAGNOSIS — R000 Tachycardia, unspecified: Secondary | ICD-10-CM | POA: Diagnosis present

## 2021-11-22 DIAGNOSIS — Z8261 Family history of arthritis: Secondary | ICD-10-CM

## 2021-11-22 DIAGNOSIS — G893 Neoplasm related pain (acute) (chronic): Secondary | ICD-10-CM | POA: Diagnosis present

## 2021-11-22 DIAGNOSIS — C799 Secondary malignant neoplasm of unspecified site: Secondary | ICD-10-CM | POA: Diagnosis present

## 2021-11-22 DIAGNOSIS — C7800 Secondary malignant neoplasm of unspecified lung: Secondary | ICD-10-CM

## 2021-11-22 DIAGNOSIS — Z79899 Other long term (current) drug therapy: Secondary | ICD-10-CM

## 2021-11-22 DIAGNOSIS — I3139 Other pericardial effusion (noninflammatory): Secondary | ICD-10-CM | POA: Diagnosis present

## 2021-11-22 DIAGNOSIS — R64 Cachexia: Secondary | ICD-10-CM | POA: Diagnosis present

## 2021-11-22 DIAGNOSIS — M549 Dorsalgia, unspecified: Secondary | ICD-10-CM

## 2021-11-22 DIAGNOSIS — M546 Pain in thoracic spine: Secondary | ICD-10-CM | POA: Diagnosis present

## 2021-11-22 DIAGNOSIS — D333 Benign neoplasm of cranial nerves: Secondary | ICD-10-CM | POA: Diagnosis present

## 2021-11-22 DIAGNOSIS — E876 Hypokalemia: Secondary | ICD-10-CM | POA: Diagnosis present

## 2021-11-22 DIAGNOSIS — R432 Parageusia: Secondary | ICD-10-CM | POA: Diagnosis present

## 2021-11-22 DIAGNOSIS — R059 Cough, unspecified: Secondary | ICD-10-CM | POA: Diagnosis present

## 2021-11-22 DIAGNOSIS — Z888 Allergy status to other drugs, medicaments and biological substances status: Secondary | ICD-10-CM

## 2021-11-22 DIAGNOSIS — Z20822 Contact with and (suspected) exposure to covid-19: Secondary | ICD-10-CM | POA: Diagnosis present

## 2021-11-22 LAB — COMPREHENSIVE METABOLIC PANEL
ALT: 14 U/L (ref 0–44)
AST: 24 U/L (ref 15–41)
Albumin: 2.5 g/dL — ABNORMAL LOW (ref 3.5–5.0)
Alkaline Phosphatase: 89 U/L (ref 38–126)
Anion gap: 6 (ref 5–15)
BUN: 17 mg/dL (ref 8–23)
CO2: 25 mmol/L (ref 22–32)
Calcium: 8.4 mg/dL — ABNORMAL LOW (ref 8.9–10.3)
Chloride: 106 mmol/L (ref 98–111)
Creatinine, Ser: 0.8 mg/dL (ref 0.44–1.00)
GFR, Estimated: >60 mL/min (ref >60)
Glucose, Bld: 142 mg/dL — ABNORMAL HIGH (ref 70–99)
Potassium: 3.6 mmol/L (ref 3.5–5.1)
Sodium: 137 mmol/L (ref 135–145)
Total Bilirubin: 0.3 mg/dL (ref 0.3–1.2)
Total Protein: 6 g/dL — ABNORMAL LOW (ref 6.5–8.1)

## 2021-11-22 LAB — CBC WITH DIFFERENTIAL/PLATELET
Abs Immature Granulocytes: 0.04 10*3/uL (ref 0.00–0.07)
Basophils Absolute: 0.1 10*3/uL (ref 0.0–0.1)
Basophils Relative: 1 %
Eosinophils Absolute: 0.2 10*3/uL (ref 0.0–0.5)
Eosinophils Relative: 2 %
HCT: 36.6 % (ref 36.0–46.0)
Hemoglobin: 11.9 g/dL — ABNORMAL LOW (ref 12.0–15.0)
Immature Granulocytes: 0 %
Lymphocytes Relative: 5 %
Lymphs Abs: 0.5 10*3/uL — ABNORMAL LOW (ref 0.7–4.0)
MCH: 29.4 pg (ref 26.0–34.0)
MCHC: 32.5 g/dL (ref 30.0–36.0)
MCV: 90.4 fL (ref 80.0–100.0)
Monocytes Absolute: 0.4 10*3/uL (ref 0.1–1.0)
Monocytes Relative: 4 %
Neutro Abs: 9.1 10*3/uL — ABNORMAL HIGH (ref 1.7–7.7)
Neutrophils Relative %: 88 %
Platelets: 449 10*3/uL — ABNORMAL HIGH (ref 150–400)
RBC: 4.05 MIL/uL (ref 3.87–5.11)
RDW: 12.6 % (ref 11.5–15.5)
WBC: 10.2 10*3/uL (ref 4.0–10.5)
nRBC: 0 % (ref 0.0–0.2)

## 2021-11-22 LAB — APTT: aPTT: 27 seconds (ref 24–36)

## 2021-11-22 LAB — PROTIME-INR
INR: 1 (ref 0.8–1.2)
Prothrombin Time: 12.9 seconds (ref 11.4–15.2)

## 2021-11-22 LAB — BLOOD GAS, VENOUS
Acid-Base Excess: 1.7 mmol/L (ref 0.0–2.0)
Bicarbonate: 25.8 mmol/L (ref 20.0–28.0)
O2 Saturation: 93.5 %
Patient temperature: 37
pCO2, Ven: 38 mmHg — ABNORMAL LOW (ref 44–60)
pH, Ven: 7.44 — ABNORMAL HIGH (ref 7.25–7.43)
pO2, Ven: 62 mmHg — ABNORMAL HIGH (ref 32–45)

## 2021-11-22 LAB — RESP PANEL BY RT-PCR (FLU A&B, COVID) ARPGX2
Influenza A by PCR: NEGATIVE
Influenza B by PCR: NEGATIVE
SARS Coronavirus 2 by RT PCR: NEGATIVE

## 2021-11-22 LAB — LACTIC ACID, PLASMA
Lactic Acid, Venous: 1.7 mmol/L (ref 0.5–1.9)
Lactic Acid, Venous: 1.3 mmol/L (ref 0.5–1.9)

## 2021-11-22 LAB — PROCALCITONIN: Procalcitonin: <0.1 ng/mL

## 2021-11-22 MED ORDER — AMLODIPINE BESYLATE 5 MG PO TABS
5.0000 mg | ORAL_TABLET | Freq: Every day | ORAL | Status: DC
  Filled 2021-11-22: qty 1

## 2021-11-22 MED ORDER — ADULT MULTIVITAMIN W/MINERALS CH
1.0000 | ORAL_TABLET | Freq: Every day | ORAL | Status: DC
  Administered 2021-11-23 – 2021-11-29 (×7): 1 via ORAL
  Filled 2021-11-22 (×7): qty 1

## 2021-11-22 MED ORDER — TRAMADOL HCL 50 MG PO TABS
50.0000 mg | ORAL_TABLET | Freq: Every day | ORAL | Status: DC
  Administered 2021-11-23: 50 mg via ORAL
  Administered 2021-11-24: 100 mg via ORAL
  Administered 2021-11-25 – 2021-11-27 (×3): 50 mg via ORAL
  Administered 2021-11-28 – 2021-11-29 (×2): 100 mg via ORAL
  Filled 2021-11-22: qty 2
  Filled 2021-11-22 (×3): qty 1
  Filled 2021-11-22 (×2): qty 2
  Filled 2021-11-22: qty 1

## 2021-11-22 MED ORDER — LIDOCAINE 5 % EX PTCH
1.0000 | MEDICATED_PATCH | CUTANEOUS | Status: DC
  Administered 2021-11-22 – 2021-11-29 (×8): 1 via TRANSDERMAL
  Filled 2021-11-22 (×8): qty 1

## 2021-11-22 MED ORDER — LACTATED RINGERS IV BOLUS (SEPSIS)
1000.0000 mL | Freq: Once | INTRAVENOUS | Status: AC
  Administered 2021-11-22: 1000 mL via INTRAVENOUS

## 2021-11-22 MED ORDER — OXYBUTYNIN CHLORIDE 5 MG PO TABS
5.0000 mg | ORAL_TABLET | Freq: Every day | ORAL | Status: DC
Start: 2021-11-23 — End: 2021-11-27
  Administered 2021-11-23 – 2021-11-27 (×5): 5 mg via ORAL
  Filled 2021-11-22 (×5): qty 1

## 2021-11-22 MED ORDER — LORAZEPAM 2 MG/ML IJ SOLN
0.5000 mg | Freq: Every evening | INTRAMUSCULAR | Status: DC | PRN
  Administered 2021-11-24: 0.5 mg via INTRAVENOUS
  Filled 2021-11-22: qty 1

## 2021-11-22 MED ORDER — ONDANSETRON HCL 4 MG/2ML IJ SOLN
4.0000 mg | Freq: Four times a day (QID) | INTRAMUSCULAR | Status: AC | PRN
  Administered 2021-11-25: 4 mg via INTRAVENOUS
  Filled 2021-11-22: qty 2

## 2021-11-22 MED ORDER — LATANOPROST 0.005 % OP SOLN: 1.0000 [drp] | Freq: Every day | OPHTHALMIC | 12 refills | Status: AC

## 2021-11-22 MED ORDER — ACETAMINOPHEN 650 MG RE SUPP: 650.0000 mg | Freq: Four times a day (QID) | RECTAL | Status: DC | PRN

## 2021-11-22 MED ORDER — CYCLOBENZAPRINE HCL 10 MG PO TABS
10.0000 mg | ORAL_TABLET | Freq: Two times a day (BID) | ORAL | Status: DC
  Administered 2021-11-22 – 2021-11-29 (×15): 10 mg via ORAL
  Filled 2021-11-22 (×15): qty 1

## 2021-11-22 MED ORDER — LATANOPROST 0.005 % OP SOLN
1.0000 [drp] | Freq: Every day | OPHTHALMIC | Status: DC
  Administered 2021-11-22 – 2021-11-30 (×8): 1 [drp] via OPHTHALMIC
  Filled 2021-11-22: qty 2.5

## 2021-11-22 MED ORDER — ONDANSETRON HCL 4 MG PO TABS: 4.0000 mg | ORAL_TABLET | Freq: Four times a day (QID) | ORAL | Status: AC | PRN

## 2021-11-22 MED ORDER — ADULT MULTIVITAMIN W/MINERALS CH: 1.0000 | ORAL_TABLET | Freq: Every day | ORAL | 0 refills | Status: AC

## 2021-11-22 MED ORDER — HEPARIN SODIUM (PORCINE) 5000 UNIT/ML IJ SOLN
5000.0000 [IU] | Freq: Three times a day (TID) | INTRAMUSCULAR | Status: AC
  Administered 2021-11-22: 5000 [IU] via SUBCUTANEOUS
  Filled 2021-11-22: qty 1

## 2021-11-22 MED ORDER — SODIUM CHLORIDE 0.9 % IV SOLN
2.0000 g | INTRAVENOUS | Status: DC
  Administered 2021-11-22: 2 g via INTRAVENOUS
  Filled 2021-11-22: qty 20

## 2021-11-22 MED ORDER — LACTATED RINGERS IV SOLN: INTRAVENOUS | Status: DC

## 2021-11-22 MED ORDER — SODIUM CHLORIDE 0.9 % IV SOLN
500.0000 mg | INTRAVENOUS | Status: DC
  Administered 2021-11-22: 500 mg via INTRAVENOUS
  Filled 2021-11-22: qty 5

## 2021-11-22 MED ORDER — DULOXETINE HCL 30 MG PO CPEP
30.0000 mg | ORAL_CAPSULE | Freq: Every day | ORAL | Status: DC
  Administered 2021-11-23 – 2021-11-26 (×4): 30 mg via ORAL
  Filled 2021-11-22 (×4): qty 1

## 2021-11-22 MED ORDER — ACETAMINOPHEN 325 MG PO TABS
650.0000 mg | ORAL_TABLET | Freq: Four times a day (QID) | ORAL | Status: DC | PRN
  Administered 2021-11-25: 650 mg via ORAL
  Filled 2021-11-22: qty 2

## 2021-11-22 MED ORDER — GUAIFENESIN ER 600 MG PO TB12
600.0000 mg | ORAL_TABLET | Freq: Two times a day (BID) | ORAL | Status: DC
  Administered 2021-11-22 – 2021-11-29 (×15): 600 mg via ORAL
  Filled 2021-11-22 (×15): qty 1

## 2021-11-22 NOTE — Assessment & Plan Note (Deleted)
In setting of pleural effusion and ongoing dyspnea.  Patient reported sensation of phlegm, with difficulty expectorating, pain limits strong cough. ?- Scheduled Mucinex twice daily ?- Incentive spirometry and flutter valve  ?

## 2021-11-22 NOTE — Discharge Instructions (Addendum)
[facility] Stay ?Proper nutrition can help your body recover from illness and injury.   ?Foods and beverages high in protein, vitamins, and minerals help rebuild muscle loss, promote healing, & reduce fall risk.  ? ?In addition to eating healthy foods, a nutrition shake is an easy, delicious way to get the nutrition you need during and after your hospital stay ? ?It is recommended that you continue to drink 2 bottles per day of:       Ensure Plus or similar for at least 1 month (30 days) after your hospital stay  ? ?Tips for adding a nutrition shake into your routine: ?As allowed, drink one with vitamins or medications instead of water or juice ?Enjoy one as a tasty mid-morning or afternoon snack ?Drink cold or make a milkshake out of it ?Drink one instead of milk with cereal or snacks ?Use as a coffee creamer ?  ?Available at the following grocery stores and pharmacies:           ?* [place]  ?* [facility]          * [place]  ?* [facility]      * Target  * [employer]   ?* [facility]  * [employer]   ?[facility] [phone]  ?          ?For COUPONS visit: www.[email or web address]/join or [email or web address]  ? ?Suggested Substitutions ?Ensure Plus = Boost Plus = Carnation Breakfast Essentials = Boost Compact ?Ensure Active Clear = Boost Breeze ?Glucerna Shake = Boost Glucose Control = Carnation Breakfast Essentials SUGAR FREE ? ?  ? ?Tips For Adding Protein Nutrition Therapy  ?Patients may be advised to increase the protein in their diet but not necessarily the calories as well. However, note that when adding protein to your diet, you will also be adding extra calories. The following suggestions may help add the extra protein while keeping the calories as low as possible. ? ?Tips ?Add extra egg to one or more meals  ?Increase the portion of milk to drink and change to skim milk if able  ?Include [place] yogurt or cottage cheese for snack or part of a meal   ?Increase portion size of protein entr?e and decrease portion of starch/bread  ?Mix protein powder, nut butter, almond/nut milk, non-fat dry milk, or Greek yogurt to shakes and smoothies  ?Use these ingredients also in baked goods or other recipes ?Use double the amount of sandwich filling  ?Add protein foods to all snacks including cheese, nut butters, milk and yogurt ?Food Tips for Including Protein  ?Beans Cook and use dried peas, beans, and tofu in soups or add to casseroles, pastas, and grain dishes that also contain cheese or meat  ?Mash with cheese and milk  ?Use tofu to make smoothies  ?Commercial Protein Supplements Use nutritional supplements or protein powder sold at pharmacies and grocery stores  ?Use protein powder in milk drinks and desserts, such as pudding  ?Mix with ice cream, milk, and fruit or other flavorings for a high-protein milkshake  ?Cottage Cheese or [employer] Mix with or use to stuff fruits and vegetables  ?Add to casseroles, spaghetti, noodles, or egg dishes such as omelets, scrambled eggs, and souffl?s  ?Use gelatin, pudding-type desserts, cheesecake, and pancake or waffle batter  ?Use to stuff crepes, pasta shells, or manicotti  ?Puree and use as a substitute for sour cream  ?Eggs, Egg whites, and Egg Yolks Add  chopped, hard-cooked eggs to salads and dressings, vegetables, casseroles, and creamed meats  ?Beat eggs into mashed potatoes, vegetable purees, and sauces  ?Add extra egg whites to quiches, scrambled eggs, custards, puddings, pancake batter, or [place] toast wash/batter  ?Make a rich custard with egg yolks, double strength milk, and sugar  ?Add extra hard-cooked yolks to deviled egg filling and sandwich spreads  ?Hard or Semi-Soft Cheese (Cheddar, Sherley Bounds) Melt on sandwiches, bread, muffins, tortillas, hamburgers, hot dogs, other meats or fish, vegetables, eggs, or desserts such as stewed figs or pies  ?Grate and add to soups, sauces, casseroles, vegetable dishes,  potatoes, rice noodles, or meatloaf  ?Serve as a snack with crackers or bagels  ?Ice cream, Yogurt, and Frozen Yogurt Add to milk drinks such as milkshakes  ?Add to cereals, fruits, gelatin desserts, and pies  ?Blend or whip with soft or cooked fruits  ?Sandwich ice cream or frozen yogurt between enriched cake slices, cookies, or graham crackers  ?Use seasoned yogurt as a dip for fruits, vegetables, or chips  ?Use yogurt in place of sour cream in casseroles  ?Meat and Fish Add chopped, cooked meat or fish to vegetables, salads, casseroles, soups, sauces, and biscuit dough  ?Use in omelets, souffl?s, quiches, and sandwich fillings  ?Add chicken and [place] to stuffing  ?Wrap in pie crust or biscuit dough as turnovers  ?Add to stuffed baked potatoes  ?Add pureed meat to soups  ?Milk Use in beverages and in cooking  ?Use in preparing foods, such as hot cereal, soups, cocoa, or pudding  ?Add cream sauces to vegetable and other dishes  ?Use evaporated milk, evaporated skim milk, or sweetened condensed milk instead of milk or water in recipes.  ?Nonfat Dry Milk Add 1/3 cup of nonfat dry milk powdered milk to each cup of regular milk for ?double strength? milk  ?Add to yogurt and milk drinks, such as pasteurized eggnog and milkshakes  ?Add to scrambled eggs and mashed potatoes  ?Use in casseroles, meatloaf, hot cereal, breads, muffins, sauces, cream soups, puddings and custards, and other milk-based desserts  ?Nuts, Seeds, and Wheat Germ Add to casseroles, breads, muffins, pancakes, cookies, and waffles  ?Sprinkle on fruit, cereal, ice cream, yogurt, vegetables, salads, and toast as a crunchy topping  ?Use in place of breadcrumbs  ?Blend with parsley or spinach, herbs, and cream for a noodle, pasta, or vegetable sauce.  ?Roll banana in chopped nuts  ?Peanut Butter Spread on sandwiches, toast, muffins, crackers, waffles, pancakes, and fruit slices  ?Use as a dip for raw vegetables, such as carrots, cauliflower, and celery   ?Blend with milk drinks, smoothies, and other beverages  ?Swirl through soft ice cream or yogurt  ?Spread on a banana then roll in crushed, dry cereal or chopped nuts  ? ?Copyright 2020 ? Academy of Nutrition and Dietetics. All rights reserved ? ?

## 2021-11-22 NOTE — TOC Transition Note (Signed)
Transition of Care (TOC) - CM/SW Discharge Note ? ? ?Patient Details  ?Name: Carly Abbott ?MRN: [id number] ?Date of Birth: 06/24/1956 ? ?Transition of Care (TOC) CM/SW Contact:  ?Raina Mina, LCSWA ?Phone Number: ?11/22/2021, 3:03 PM ? ? ?Clinical Narrative:   CSW spoke with patients friend Juliann Pulse and provided her with [facility] healthcare contact information to have patients primary care provider fill out the personal care services form. CSW explained to the friend if patient is approved through [place] that she can possibly get up 4 hours a day care. CSW also told the friend she could also pay privately for a caregiver to come into the home.  ? ? ? ?  ?  ? ? ?Patient Goals and CMS Choice ?  ?  ?  ? ?Discharge Placement ?  ?           ?  ?  ?  ?  ? ?Discharge Plan and Services ?  ?  ?           ?  ?  ?  ?  ?  ?  ?  ?  ?  ?  ? ?Social Determinants of Health (SDOH) Interventions ?  ? ? ?Readmission Risk Interventions ?   ? View : No data to display.  ?  ?  ?  ? ? ? ? ? ?

## 2021-11-22 NOTE — Progress Notes (Signed)
NUTRITION NOTE ? ?Page received from Attending. Plan for d/c today. RD working on site yesterday completed a full assessment although patient was noted to be out of the room at that time.  ? ?RD working remotely today and unable to complete NFPE.  ? ?Will place handouts in AVS and smart phrase for encouragement of continuing Ensure (or similar) BID after d/c. ? ? ? ? ?Jarome Matin, MS, RD, LDN ?Registered Dietitian II ?Inpatient Clinical Nutrition ?RD pager # and on-call/weekend pager # available in [place]  ? ?

## 2021-11-22 NOTE — Hospital Course (Addendum)
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  She was reportedly too short of breath to make it into her home, and returned to the ED. ? ?Her O2 requirement increased from 2 >> 4 L/min, later up to 8 L/min, with tachypnea and mild tachycardia. ?Chest xray showed some re-accumulation of the R pleural effusion since the thoracentesis on 5/5. ? ?Admitted to hospitalist service.  IR consulted for placement of PleurX pigtail catheter, as patient prefers not to have to undergo frequent serial paracenteses. ? ?5/7: requiring low dose morphine to control dyspnea. ? ?5/8: Pleurx catheter placed by interventional radiology. ? 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  Dr. Tasia Catchings was reconsulted. ? ?11/26/2021 CT scan of the chest showed no evidence of pulmonary embolism or thoracic dissection.  Showed a moderate to large pericardial effusion and small bilateral pleural effusions.  Infiltrate seen in both lungs and COPD seen.  Patient was started on Rocephin and Zithromax on 11/27/2021. ? ?Echocardiogram resulted on 11/28/2021 showed a moderate pericardial effusion with normal ejection fraction and no signs of pericardial tamponade.   ? ?Cardiology on the evening of 5/12 recommended cardiothoracic surgery consultation for pericardial window to reduce the amount of pericardial effusion and reduce the potential risk of pericardial tamponade.  Case discussed with covering oncology on 11/29/2021 and recommended speaking  with cardiothoracic surgery.  I spoke with cardio thoracic surgery Dr. Prescott Gum who reviewed viewed the patient's studies and recommended transfer for pericardial window. ? ?CT scan of the abdomen and pelvis resulted on the morning of 11/29/2021 showed extremely large burden of stool and stool balls within the cecum.  Stomach is distended and there is transit of oral contrast into the small bowel.  Could have incomplete gastric outlet obstruction.  Small bilateral effusions.  Masslike consolidation infrahilar right lung and right middle lobe. ? ?The patient does not having any nausea or vomiting but not eating very much.  Does complain of constipation so I will get more aggressive with anticonstipation medications. ? ?Patient does have a lot of pain with fluid removal from Pleurx catheter.  Had to start OxyContin and oxycodone to control pain.  As needed IV morphine. ? ? ?

## 2021-11-22 NOTE — Assessment & Plan Note (Addendum)
Continue home Flexeril 10 mg p.o. twice daily ?

## 2021-11-22 NOTE — Assessment & Plan Note (Addendum)
-  patient underwent thoracentesis on 5/5 with 1.2 L fluid removed.  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 ?--IR placed a Pleurx drainage catheter on 5/8 ?--Oncology follow-up ?--Oral oxycodone 10 mg as needed for pain ?--OxyContin 10 mg twice a day ?

## 2021-11-22 NOTE — Assessment & Plan Note (Deleted)
Appears not on medication ?

## 2021-11-22 NOTE — TOC Transition Note (Signed)
Transition of Care (TOC) - CM/SW Discharge Note ? ? ?Patient Details  ?Name: Carly Abbott ?MRN: [id number] ?Date of Birth: 03-Dec-1955 ? ?Transition of Care (TOC) CM/SW Contact:  ?Raina Mina, LCSWA ?Phone Number: ?11/22/2021, 2:20 PM ? ? ?Clinical Narrative:   Patient accepted by [facility] for home health services for PT.  ? ? ? ?  ?  ? ? ?Patient Goals and CMS Choice ?  ?  ?  ? ?Discharge Placement ?  ?           ?  ?  ?  ?  ? ?Discharge Plan and Services ?  ?  ?           ?  ?  ?  ?  ?  ?  ?  ?  ?  ?  ? ?Social Determinants of Health (SDOH) Interventions ?  ? ? ?Readmission Risk Interventions ?   ? View : No data to display.  ?  ?  ?  ? ? ? ? ? ?

## 2021-11-22 NOTE — Assessment & Plan Note (Addendum)
Lidocaine patch

## 2021-11-22 NOTE — Discharge Summary (Signed)
?Physician Discharge Summary ?  ?Patient: Carly Abbott MRN: [id number] DOB: 1956/01/28  ?Admit date:     11/20/2021  ?Discharge date: 11/22/21  ?Discharge Physician: Fritzi Mandes  ? ?PCP: Dion Body, MD  ? ?Recommendations at discharge:  ? ? F/u Dr Tasia Catchings for evaluation of malignant pleural effusion on the appointment that will be called to you ?follow-up PCP Dr. Netty Starring in 1 to 2 week ? 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 ? ?Hospital Course: ?Ms. Carly Abbott is a 66 year old female with history of depression with anxiety, multiple sclerosis, vestibular schwannoma, hypertension, history of hyperlipidemia, who presents emergency department for chief concerns of cough and shortness of breath for 4-5 weeks. ?  ?Right-sided malignant pleural effusion ?Hypoxia--new ?COPD (ON CXR) with 30 years h/o heavy tobacco abuse ?-- patient underwent thoracentesis and 1.2 L fluid removed ?-- pathologist Dr. Reuel Derby-- preliminary report shows positive malignant cells ?-- patient has history of smoking for 30 years. Chest x-ray consistent with emphysema. Right lower lobe fullness?mass ?-- oncology consultation with Dr. Tasia Catchings appreciated ?-- discussed preliminary results with patient ?-- discontinue IV antibiotics since clinically does not have pneumonia ?--normal wbc no fever, normal procalcitonin ?-- does qualify for home oxygen 2 liters/min ? ?anxiety depression ?-- continue home meds ?  ?Hyperlipidemia ?--patient does not take statins any longer. She tells me she was taken off by Dr. Netty Starring ?  ?Hypertension ?-- continue amlodipine ?  ?history of MS with foot drop ?-- patient follows with Dr. Melrose Nakayama neurology ?  ?Underweight ?-- per RD--Ensure Enlive po TID, each supplement provides 350 kcal and 20 grams of protein ?-MVI with minerals daily ?-Liberalize diet to regular, for widest variety of meal selections ?  ?will discharge  patient to home. Arrange health PT ?  ?  ?Procedures: right thoracentesis ?Family communication : left message for friend Juliann Pulse ?Consults : oncology ?CODE STATUS: full ?DVT Prophylaxis : heparin ?Level of care: Telemetry Medical ?Status is: Observation ?The patient remains OBS appropriate and will d/c before 2 midnights. ? ?Discharged home. Discussed with patient importance of following up with Dr. Tasia Catchings ?  ? ? ?Consultants: oncology ?Procedures performed: right-sided thoracentesis ?Disposition: Home health ?Diet recommendation:  ?Regular diet ?DISCHARGE MEDICATION: ?Allergies as of 11/22/2021   ? ?   Reactions  ? Cardizem [diltiazem Hcl] Itching  ? Other Hives  ? TECFREDA   ? ?  ? ?  ?Medication List  ?  ? ?STOP taking these medications   ? ?atorvastatin 10 MG tablet ?Commonly known as: LIPITOR ?  ?diazepam 5 MG tablet ?Commonly known as: VALIUM ?  ?Fingolimod HCl 0.5 MG Caps ?  ?Interferon Beta-1a 30 MCG/0.5ML Ajkt ?  ? ?  ? ?TAKE these medications   ? ?acetaminophen 500 MG tablet ?Commonly known as: TYLENOL ?Take 500 mg by mouth every 6 (six) hours as needed. ?  ?amLODipine 5 MG tablet ?Commonly known as: NORVASC ?Take 1 tablet (5 mg total) by mouth daily. ?  ?cyclobenzaprine 10 MG tablet ?Commonly known as: FLEXERIL ?Take 10 mg by mouth 2 (two) times daily. ?  ?DULoxetine 30 MG capsule ?Commonly known as: CYMBALTA ?Take 30 mg by mouth daily. ?  ?latanoprost 0.005 % ophthalmic solution ?Commonly known as: XALATAN ?Place 1 drop into both eyes at bedtime. ?  ?multivitamin with minerals Tabs tablet ?Take 1 tablet by mouth daily. ?Start taking on: Nov 23, 2021 ?  ?oxybutynin 5 MG tablet ?Commonly known as:  DITROPAN ?Take 5 mg by mouth daily. ?  ?traMADol 50 MG tablet ?Commonly known as: ULTRAM ?Take 50-100 mg by mouth daily. ?  ? ?  ? ?  ?  ? ? ?  ?Durable Medical Equipment  ?(From admission, onward)  ?  ? ? ?  ? ?  Start     Ordered  ? 11/22/21 0831  For home use only DME oxygen  Once       ?Question Answer Comment   ?Length of Need Lifetime   ?Mode or (Route) Nasal cannula   ?Liters per Minute 2   ?Frequency Continuous (stationary and portable oxygen unit needed)   ?Oxygen conserving device Yes   ?Oxygen delivery system Gas   ?  ? 11/22/21 0830  ? ?  ?  ? ?  ? ? Follow-up Information   ? ? Earlie Server, MD. Go in 3 day(s).   ?Specialty: Oncology ?Why: Go to your appointment to the cancer center on the appointment that will be called to you for evaluation of malignant pleural effusion ?Contact information: ?[place] [address] ?[phone] ? ? ?  ?  ? ? Dion Body, MD. Schedule an appointment as soon as possible for a visit in 1 week(s).   ?Specialty: Family Medicine ?Contact information: ?[address] ?[place] [address] ?[phone] ? ? ?  ?  ? ?  ?  ? ?  ? ?Discharge Exam: ?Filed Weights  ? 11/20/21 2032 11/20/21 2159  ?Weight: 38.6 kg 38.6 kg  ? ? ? ?Condition at discharge: fair ? ?The results of significant diagnostics from this hospitalization (including imaging, microbiology, ancillary and laboratory) are listed below for reference.  ? ?Imaging Studies: ?DG Chest 2 View ? ?Result Date: 11/20/2021 ?CLINICAL DATA:  Possible sepsis with shortness of breath and cough, initial encounter EXAM: CHEST - 2 VIEW COMPARISON:  None Available. FINDINGS: Cardiac shadow is at the upper limits of normal in size. Lungs are hyperinflated. Right middle lobe pneumonia is seen with associated pleural effusion. No other focal abnormality is seen. IMPRESSION: Right middle lobe pneumonia with associated right-sided effusion. Electronically Signed   By: Inez Catalina M.D.   On: 11/20/2021 19:59  ? ?DG Chest Port 1 View ? ?Result Date: 11/21/2021 ?CLINICAL DATA:  Status post right pleural effusion. EXAM: PORTABLE CHEST 1 VIEW COMPARISON:  Chest x-ray from yesterday. FINDINGS: Stable cardiomediastinal silhouette. Decreased now trace right pleural effusion status post thoracentesis. Somewhat  improved aeration at the right lung base with residual patchy consolidation and interstitial opacity in the right middle lobe. New trace left pleural effusion. No pneumothorax. No acute osseous abnormality. IMPRESSION: 1. Decreased now trace right pleural effusion status post thoracentesis. No pneumothorax. 2. Somewhat improved aeration at the right lung base with residual patchy consolidation and interstitial opacity in the right middle lobe suspicious for pneumonia. Followup PA and lateral chest X-ray is recommended in 3-4 weeks following trial of antibiotic therapy to ensure resolution. 3. New trace left pleural effusion. Electronically Signed   By: Titus Dubin M.D.   On: 11/21/2021 11:28  ? ?US THORACENTESIS ASP PLEURAL SPACE W/IMG GUIDE ? ?Result Date: 11/21/2021 ?INDICATION: Patient admitted with right middle lobe pneumonia and found to have right-sided pleural effusion. Interventional radiology asked to perform a diagnostic and therapeutic thoracentesis. EXAM: ULTRASOUND GUIDED THORACENTESIS MEDICATIONS: 1% lidocaine 10 mL COMPLICATIONS: None immediate. PROCEDURE: An ultrasound guided thoracentesis was thoroughly discussed with the patient and questions answered. The benefits, risks, alternatives  and complications were also discussed. The patient understands and wishes to proceed with the procedure. Written consent was obtained. Ultrasound was performed to localize and mark an adequate pocket of fluid in the right chest. The area was then prepped and draped in the normal sterile fashion. 1% Lidocaine was used for local anesthesia. Under ultrasound guidance a 6 Fr Safe-T-Centesis catheter was introduced. Thoracentesis was performed. The catheter was removed and a dressing applied. FINDINGS: A total of approximately 1.2 L of clear yellow fluid was removed. Samples were sent to the laboratory as requested by the clinical team. IMPRESSION: Successful ultrasound guided right thoracentesis yielding 1.2 L of  pleural fluid. Read by: Soyla Dryer, NP Electronically Signed   By: Miachel Roux M.D.   On: 11/21/2021 11:50   ? ?Microbiology: ?Results for orders placed or performed during the hospital encounter of 11/20/21  ?B

## 2021-11-22 NOTE — Assessment & Plan Note (Addendum)
Dietitian consulted ?

## 2021-11-22 NOTE — Progress Notes (Signed)
SATURATION QUALIFICATIONS: (This note is used to comply with regulatory documentation for home oxygen) ? ?Patient Saturations on Room Air at Rest = 86% ? ? ? ? ?

## 2021-11-22 NOTE — Assessment & Plan Note (Addendum)
CT scan showing pneumonia.  Continue 5 days of Rocephin and Zithromax started on 11/26/2021 ? ?

## 2021-11-22 NOTE — Sepsis Progress Note (Signed)
Elink following code sepsis °

## 2021-11-22 NOTE — Assessment & Plan Note (Deleted)
Patient is status post right-sided ultrasound-guided thoracentesis yielding 1.2 L of clear yellow fluid on 11/21/2021 with ?preliminary fluid studies positive for malignant cells  ? ?--outpatient follow-up with medical oncology ?

## 2021-11-22 NOTE — ED Provider Notes (Signed)
? ?[facility] ?Provider Note ? ? Event Date/Time  ? First MD Initiated Contact with Patient 11/22/21 1833   ?  (approximate) ?History  ?Code Sepsis ? ?HPI ?Carly Abbott is a 66 y.o. 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  Patient states that she was recently admitted and had the right pleural effusion drained of approximately 1 L.  Patient states that she feels that she had been doing well until she got up to try to walk to her house and became extremely short of breath.  Patient also complains of 6/10 aching/burning right CVA pain that has been worsening over the last 12 hours.  Patient placed on 4 L nasal cannula by EMS due to hypoxia on their arrival.  Patient currently denies any vision changes, tinnitus, difficulty speaking, facial droop, sore throat, chest pain, abdominal pain, nausea/vomiting/diarrhea, dysuria, or weakness/numbness/paresthesias in any extremity ?Physical Exam  ?Triage Vital Signs: ?ED Triage Vitals  ?Enc Vitals Group  ?   BP 11/22/21 1836 98/65  ?   Pulse Rate 11/22/21 1836 (!) 108  ?   Resp 11/22/21 1836 (!) 23  ?   Temp 11/22/21 1836 98.3 ?F (36.8 ?C)  ?   Temp src --   ?   SpO2 11/22/21 1834 98 %  ?   Weight 11/22/21 1837 85 lb (38.6 kg)  ?   Height --   ?   Head Circumference --   ?   Peak Flow --   ?   Pain Score 11/22/21 1837 6  ?   Pain Loc --   ?   Pain Edu? --   ?   Excl. in [place]? --   ? ?Most recent vital signs: ?Vitals:  ? 11/22/21 2000 11/22/21 2030  ?BP: 95/62 109/62  ?Pulse: 91 89  ?Resp: 18 19  ?Temp:    ?SpO2: 99% 98%  ? ?General: Awake, oriented x4. ?CV:  Good peripheral perfusion.  ?Resp:  Increased effort.  Decreased breath sounds and rales over right lung fields ?Abd:  No distention.  ?Other:  Cachectic appearing elderly Caucasian female laying in bed in moderate respiratory distress ?ED Results / Procedures / Treatments  ?Labs ?(all labs  ordered are listed, but only abnormal results are displayed) ?Labs Reviewed  ?COMPREHENSIVE METABOLIC PANEL - Abnormal; Notable for the following components:  ?    Result Value  ? Glucose, Bld 142 (*)   ? Calcium 8.4 (*)   ? Total Protein 6.0 (*)   ? Albumin 2.5 (*)   ? All other components within normal limits  ?CBC WITH DIFFERENTIAL/PLATELET - Abnormal; Notable for the following components:  ? Hemoglobin 11.9 (*)   ? Platelets 449 (*)   ? Neutro Abs 9.1 (*)   ? Lymphs Abs 0.5 (*)   ? All other components within normal limits  ?BLOOD GAS, VENOUS - Abnormal; Notable for the following components:  ? pH, Ven 7.44 (*)   ? pCO2, Ven 38 (*)   ? pO2, Ven 62 (*)   ? All other components within normal limits  ?RESP PANEL BY RT-PCR (FLU A&B, COVID) [id number]  ?CULTURE, BLOOD (ROUTINE X 2)  ?CULTURE, BLOOD (ROUTINE X 2)  ?URINE CULTURE  ?LACTIC ACID, PLASMA  ?PROTIME-INR  ?APTT  ?LACTIC ACID, PLASMA  ?URINALYSIS, COMPLETE (UACMP) WITH MICROSCOPIC  ?BASIC METABOLIC PANEL  ?CBC  ?APTT  ?PROTIME-INR  ?PROCALCITONIN  ? ?EKG ?ED ECG  REPORT ?I, Naaman Plummer, the attending physician, personally viewed and interpreted this ECG. ?Date: 11/22/2021 ?EKG Time: 1852 ?Rate: 105 ?Rhythm: Tachycardic sinus rhythm ?QRS Axis: normal ?Intervals: normal ?ST/T Wave abnormalities: normal ?Narrative Interpretation: Tachycardic sinus rhythm.  No evidence of acute ischemia ?RADIOLOGY ?ED MD interpretation: One-view portable chest x-ray interpreted by me and shows increased right pleural effusion from yesterday with associated increased right opacity in the lower hemithorax.  There is also unchanged small left pleural effusion ?-Agree with radiology assessment ?Official radiology report(s): ?DG Chest Port 1 View ? ?Result Date: 11/22/2021 ?CLINICAL DATA:  Questionable sepsis - evaluate for abnormality Shortness of breath. EXAM: PORTABLE CHEST 1 VIEW COMPARISON:  Chest radiograph yesterday and 11/20/2021, patient underwent recent thoracentesis. FINDINGS:  Right pleural effusion is increased from radiograph yesterday. Associated increased opacity in the right lower hemithorax may be related to layering pleural effusion or atelectasis/airspace disease. Small left pleural effusion is similar. Stable heart size and mediastinal contours. No pneumothorax. IMPRESSION: 1. Increased right pleural effusion from radiograph yesterday. Associated increased opacity in the right lower hemithorax may be related to layering pleural fluid or atelectasis/airspace disease. 2. Unchanged small left pleural effusion. Electronically Signed   By: Keith Rake M.D.   On: 11/22/2021 18:59   ?PROCEDURES: ?Critical Care performed: Yes, see critical care procedure note(s) ?.1-3 Lead EKG Interpretation ?Performed by: Naaman Plummer, MD ?Authorized by: Naaman Plummer, MD  ? ?  Interpretation: normal   ?  ECG rate:  88 ?  ECG rate assessment: normal   ?  Rhythm: sinus rhythm   ?  Ectopy: none   ?  Conduction: normal   ?CRITICAL CARE ?Performed by: Naaman Plummer ? ?Total critical care time: 27 minutes ? ?Critical care time was exclusive of separately billable procedures and treating other patients. ? ?Critical care was necessary to treat or prevent imminent or life-threatening deterioration. ? ?Critical care was time spent personally by me on the following activities: development of treatment plan with patient and/or surrogate as well as nursing, discussions with consultants, evaluation of patient's response to treatment, examination of patient, obtaining history from patient or surrogate, ordering and performing treatments and interventions, ordering and review of laboratory studies, ordering and review of radiographic studies, pulse oximetry and re-evaluation of patient's condition. ? ?MEDICATIONS ORDERED IN ED: ?Medications  ?lactated ringers infusion (has no administration in time range)  ?traMADol (ULTRAM) tablet 50-100 mg (has no administration in time range)  ?amLODipine (NORVASC)  tablet 5 mg (has no administration in time range)  ?DULoxetine (CYMBALTA) DR capsule 30 mg (has no administration in time range)  ?oxybutynin (DITROPAN) tablet 5 mg (has no administration in time range)  ?cyclobenzaprine (FLEXERIL) tablet 10 mg (has no administration in time range)  ?latanoprost (XALATAN) 0.005 % ophthalmic solution 1 drop (has no administration in time range)  ?acetaminophen (TYLENOL) tablet 650 mg (has no administration in time range)  ?  Or  ?acetaminophen (TYLENOL) suppository 650 mg (has no administration in time range)  ?ondansetron (ZOFRAN) tablet 4 mg (has no administration in time range)  ?  Or  ?ondansetron (ZOFRAN) injection 4 mg (has no administration in time range)  ?heparin injection 5,000 Units (has no administration in time range)  ?multivitamin with minerals tablet 1 tablet (has no administration in time range)  ?lactated ringers bolus 1,000 mL (1,000 mLs Intravenous New Bag/Given 11/22/21 1852)  ? ?IMPRESSION / MDM / ASSESSMENT AND PLAN / ED COURSE  ?I reviewed the triage vital signs and the  nursing notes. ?             ?               ?Differential diagnosis includes, but is not limited to, malignant effusion, pneumonia, pulmonary edema, PE ?The patient is on the cardiac monitor to evaluate for evidence of arrhythmia and/or significant heart rate changes. ?Patient is a 66 year old female with the above-stated past medical history who presents via EMS complaining of worsening shortness of breath and right-sided back pain over the site of her thoracentesis.  Patient's laboratory evaluation significant for VBG showing pH 7.4, CO2 38, and oxygen 62.  Single view portable chest x-ray shows increasing right pleural effusion concerning for reaccumulation of likely malignant effusion.  Patient did show signs and symptoms of possible sepsis and therefore started on empiric antibiotics after blood cultures were drawn.  Given patient's significant worsening respiratory distress as well as  worsening right-sided effusion, she will require admission to the internal medicine service for further evaluation and management. ? ?Dispo: Admit to medicine ? ?  ?FINAL CLINICAL IMPRESSION(S) / ED DIAGNOSES  ? ?Fi

## 2021-11-22 NOTE — Consult Note (Signed)
? ?Hematology/Oncology Consult note ?[phone] [phone] ? ?  ? ? ?Patient Care Team: ?Dion Body, MD as PCP - General (Family Medicine)  ? ?Name of the patient: Carly Abbott  ?[id number]  ?02-28-1956  ? ?Date of visit: 11/22/21 ?REASON FOR COSULTATION:  ?Malignant pleural effusion ?History of presenting illness-  ?66 y.o. female with PMH listed at below who presents to ER for evaluation of shortness of breath and cough. ?Shortness of breath ongoing for 2 weeks, no hemoptysis.  Some weight loss of 7 pounds.  Decrease of appetite due to decreased taste.  Denies fever, chills.  Patient lives by herself.  She has chronic lower extremity foot drop due to MS. ?Patient is a former smoker. ? ?11/20/2021, x-ray showed right middle lobe pneumonia with associated right sided effusion. ? ?Patient was treated with a IV antibiotics.  She underwent ultrasound-guided thoracentesis and 1.2 L was removed.  Per pathology, preliminary report shows positive malignant cells. ? ?Hematology oncology was consulted for evaluation and management. ? ?Review of Systems  ?Constitutional:  Positive for appetite change, fatigue and unexpected weight change. Negative for chills and fever.  ?HENT:   Negative for hearing loss and voice change.   ?Eyes:  Negative for eye problems.  ?Respiratory:  Positive for cough and shortness of breath. Negative for chest tightness.   ?Cardiovascular:  Negative for chest pain.  ?Gastrointestinal:  Negative for abdominal distention, abdominal pain and blood in stool.  ?Endocrine: Negative for hot flashes.  ?Genitourinary:  Negative for difficulty urinating and frequency.   ?Musculoskeletal:  Negative for arthralgias.  ?     Chronic foot drop due to MS  ?Skin:  Negative for itching and rash.  ?Neurological:  Negative for extremity weakness.  ?Hematological:  Negative for adenopathy.  ?Psychiatric/Behavioral:  Negative for confusion.   ? ?Allergies  ?Allergen Reactions  ? Cardizem  [Diltiazem Hcl] Itching  ? Other Hives  ?  TECFREDA   ? ? ?Patient Active Problem List  ? Diagnosis Date Noted  ? Community acquired pneumonia 11/20/2021  ? Essential hypertension 11/20/2021  ? Hyperlipidemia 11/20/2021  ? Anxiety and depression 11/20/2021  ? Pleural effusion on right 11/20/2021  ? MS (multiple sclerosis) ([place]) 09/30/2021  ? ? ? ?Past Medical History:  ?Diagnosis Date  ? Depression   ? Glaucoma   ? Headache   ? Hyperlipidemia   ? Multiple sclerosis ([place])   ? Spinal stenosis   ? ? ? ?Past Surgical History:  ?Procedure Laterality Date  ? BACK SURGERY    ? CARPAL TUNNEL RELEASE    ? CHOLECYSTECTOMY    ? COLONOSCOPY    ? COLONOSCOPY WITH PROPOFOL N/A 05/18/2016  ? Procedure: COLONOSCOPY WITH PROPOFOL;  Surgeon: Manya Silvas, MD;  Location: [facility] ENDOSCOPY;  Service: Endoscopy;  Laterality: N/A;  ? LEG ANGIOGRAPHY    ? TONSILLECTOMY    ? ? ?Social History  ? ?Socioeconomic History  ? Marital status: Divorced  ?  Spouse name: Not on file  ? Number of children: Not on file  ? Years of education: Not on file  ? Highest education level: Not on file  ?Occupational History  ? Not on file  ?Tobacco Use  ? Smoking status: Never  ? Smokeless tobacco: Never  ?Substance and Sexual Activity  ? Alcohol use: No  ? Drug use: No  ? Sexual activity: Not on file  ?Other Topics Concern  ? Not on file  ?Social History Narrative  ? Not on file  ? ?Social  Determinants of Health  ? ?Financial Resource Strain: Not on file  ?Food Insecurity: Not on file  ?Transportation Needs: Not on file  ?Physical Activity: Not on file  ?Stress: Not on file  ?Social Connections: Not on file  ?Intimate Partner Violence: Not on file  ? ?  ?Family History  ?Problem Relation Age of Onset  ? Breast cancer Neg Hx   ? ? ? ?Current Facility-Administered Medications:  ?  acetaminophen (TYLENOL) tablet 650 mg, 650 mg, Oral, Q6H PRN, 650 mg at 11/21/21 2009 **OR** acetaminophen (TYLENOL) suppository 650 mg, 650 mg, Rectal, Q6H PRN, Cox, Amy N, DO ?   albuterol (PROVENTIL) (2.5 MG/3ML) 0.083% nebulizer solution 2.5 mg, 2.5 mg, Nebulization, Q6H PRN, Cox, Amy N, DO, 2.5 mg at 11/21/21 1006 ?  amLODipine (NORVASC) tablet 5 mg, 5 mg, Oral, Daily, Cox, Amy N, DO, 5 mg at 11/21/21 0920 ?  calcium-vitamin D (OSCAL WITH D) 500-5 MG-MCG per tablet 1 tablet, 1 tablet, Oral, BID, Fritzi Mandes, MD, 1 tablet at 11/21/21 2216 ?  cyclobenzaprine (FLEXERIL) tablet 10 mg, 10 mg, Oral, BID, Cox, Amy N, DO, 10 mg at 11/21/21 2216 ?  DULoxetine (CYMBALTA) DR capsule 30 mg, 30 mg, Oral, Daily, Cox, Amy N, DO, 30 mg at 11/21/21 0919 ?  feeding supplement (ENSURE ENLIVE / ENSURE PLUS) liquid 237 mL, 237 mL, Oral, TID BM, Fritzi Mandes, MD, 237 mL at 11/21/21 2009 ?  guaiFENesin (MUCINEX) 12 hr tablet 600 mg, 600 mg, Oral, BID PRN, Cox, Amy N, DO, 600 mg at 11/21/21 2237 ?  latanoprost (XALATAN) 0.005 % ophthalmic solution 1 drop, 1 drop, Both Eyes, QHS, Fritzi Mandes, MD, 1 drop at 11/21/21 2216 ?  multivitamin with minerals tablet 1 tablet, 1 tablet, Oral, Daily, Fritzi Mandes, MD, 1 tablet at 11/21/21 1525 ?  ondansetron (ZOFRAN) tablet 4 mg, 4 mg, Oral, Q6H PRN **OR** ondansetron (ZOFRAN) injection 4 mg, 4 mg, Intravenous, Q6H PRN, Cox, Amy N, DO ?  oxybutynin (DITROPAN) tablet 5 mg, 5 mg, Oral, Daily, Cox, Amy N, DO, 5 mg at 11/21/21 0920 ?  polyethylene glycol (MIRALAX / GLYCOLAX) packet 17 g, 17 g, Oral, Daily PRN, Cox, Amy N, DO ?  traMADol (ULTRAM) tablet 50-100 mg, 50-100 mg, Oral, Daily PRN, Cox, Amy N, DO, 50 mg at 11/21/21 1208 ? ? ?Physical exam:  ?Vitals:  ? 11/21/21 1537 11/21/21 2005 11/21/21 2010 11/22/21 0546  ?BP: 116/67 106/63  113/67  ?Pulse: 97 100  100  ?Resp: 18 (!) 24 (!) 22 (!) 22  ?Temp: 97.6 ?F (36.4 ?C) 98.2 ?F (36.8 ?C)  97.8 ?F (36.6 ?C)  ?TempSrc:  Oral    ?SpO2: 95% 94% 96% 92%  ?Weight:      ?Height:      ? ?Physical Exam ?Constitutional:   ?   General: She is not in acute distress. ?   Appearance: She is not diaphoretic.  ?   Comments: thin  ?HENT:  ?    Head: Normocephalic and atraumatic.  ?   Nose: Nose normal.  ?   Mouth/Throat:  ?   Pharynx: No oropharyngeal exudate.  ?Eyes:  ?   General: No scleral icterus. ?   Pupils: Pupils are equal, round, and reactive to light.  ?Cardiovascular:  ?   Rate and Rhythm: Normal rate and regular rhythm.  ?   Heart sounds: No murmur heard. ?Pulmonary:  ?   Effort: Pulmonary effort is normal. No respiratory distress.  ?   Comments: Decreased breath sounds  right lower lobe ?Abdominal:  ?   General: There is no distension.  ?   Palpations: Abdomen is soft.  ?   Tenderness: There is no abdominal tenderness.  ?Musculoskeletal:     ?   General: Normal range of motion.  ?   Cervical back: Normal range of motion and neck supple.  ?Skin: ?   General: Skin is warm and dry.  ?Neurological:  ?   Mental Status: She is alert and oriented to person, place, and time. Mental status is at baseline.  ?   Cranial Nerves: No cranial nerve deficit.  ?   Motor: No abnormal muscle tone.  ?Psychiatric:     ?   Mood and Affect: Mood and affect normal.  ?  ? ? ? ? ? ?  Latest Ref Rng & Units 11/20/2021  ?  7:06 PM  ?CMP  ?Glucose 70 - 99 mg/dL 92    ?BUN 8 - 23 mg/dL 14    ?Creatinine 0.44 - 1.00 mg/dL 0.68    ?Sodium 135 - 145 mmol/L 133    ?Potassium 3.5 - 5.1 mmol/L 3.4    ?Chloride 98 - 111 mmol/L 99    ?CO2 22 - 32 mmol/L 23    ?Calcium 8.9 - 10.3 mg/dL 8.5    ?Total Protein 6.5 - 8.1 g/dL 6.5    ?Total Bilirubin 0.3 - 1.2 mg/dL 0.7    ?Alkaline Phos 38 - 126 U/L 92    ?AST 15 - 41 U/L 23    ?ALT 0 - 44 U/L 11    ? ? ?  Latest Ref Rng & Units 11/20/2021  ?  7:06 PM  ?CBC  ?WBC 4.0 - 10.5 K/uL 9.4    ?Hemoglobin 12.0 - 15.0 g/dL 13.7    ?Hematocrit 36.0 - 46.0 % 41.6    ?Platelets 150 - 400 K/uL 458    ? ? ?RADIOGRAPHIC STUDIES: ?I have personally reviewed the radiological images as listed and agreed with the findings in the report. ?DG Chest 2 View ? ?Result Date: 11/20/2021 ?CLINICAL DATA:  Possible sepsis with shortness of breath and cough, initial  encounter EXAM: CHEST - 2 VIEW COMPARISON:  None Available. FINDINGS: Cardiac shadow is at the upper limits of normal in size. Lungs are hyperinflated. Right middle lobe pneumonia is seen with associated pleural effusi

## 2021-11-22 NOTE — ED Notes (Signed)
Called IP rn Maria for report. RN did not answer ?

## 2021-11-22 NOTE — Progress Notes (Signed)
[place] at [facility] ? ? ?PATIENT NAME: Carly Abbott   ? ?MR#:  [id number] ? ?DATE OF BIRTH:  January 02, 1956 ? ?SUBJECTIVE:  ? ?patient came in with shortness of breath increasing for last four weeks. Has history of smoking for 30 years. Underwent thoracentesis yesterday. Has some mild cough. No fever. ? ? ?VITALS:  ?Blood pressure (!) 97/56, pulse 86, temperature 97.7 ?F (36.5 ?C), temperature source Oral, resp. rate 18, height 4\' 10"  (1.473 m), weight 38.6 kg, SpO2 90 %. ? ?PHYSICAL EXAMINATION:  ? ?GENERAL:  66 y.o.-year-old patient lying in the bed with no acute distress. Thin, frail ?LUNGS: decreased  breath sounds bilaterally, no wheezing, rales, rhonchi.  ?CARDIOVASCULAR: S1, S2 normal. No murmurs ?ABDOMEN: Soft, nontender, nondistended. Bowel sounds present.  ?EXTREMITIES: No  edema b/l.    ?NEUROLOGIC: nonfocal  patient is alert and awake ?  ? ?LABORATORY PANEL:  ?CBC ?Recent Labs  ?Lab 11/20/21 ?1906  ?WBC 9.4  ?HGB 13.7  ?HCT 41.6  ?PLT 458*  ? ? ? ?Chemistries  ?Recent Labs  ?Lab 11/20/21 ?1906 11/20/21 ?2145  ?NA 133*  --   ?K 3.4*  --   ?CL 99  --   ?CO2 23  --   ?GLUCOSE 92  --   ?BUN 14  --   ?CREATININE 0.68  --   ?CALCIUM 8.5*  --   ?MG  --  1.8  ?AST 23  --   ?ALT 11  --   ?ALKPHOS 92  --   ?BILITOT 0.7  --   ? ? ?Cardiac Enzymes ?No results for input(s): TROPONINI in the last 168 hours. ?RADIOLOGY:  ?DG Chest 2 View ? ?Result Date: 11/20/2021 ?CLINICAL DATA:  Possible sepsis with shortness of breath and cough, initial encounter EXAM: CHEST - 2 VIEW COMPARISON:  None Available. FINDINGS: Cardiac shadow is at the upper limits of normal in size. Lungs are hyperinflated. Right middle lobe pneumonia is seen with associated pleural effusion. No other focal abnormality is seen. IMPRESSION: Right middle lobe pneumonia with associated right-sided effusion. Electronically Signed   By: Inez Catalina M.D.   On: 11/20/2021 19:59  ? ?DG Chest Port 1 View ? ?Result Date: 11/21/2021 ?CLINICAL  DATA:  Status post right pleural effusion. EXAM: PORTABLE CHEST 1 VIEW COMPARISON:  Chest x-ray from yesterday. FINDINGS: Stable cardiomediastinal silhouette. Decreased now trace right pleural effusion status post thoracentesis. Somewhat improved aeration at the right lung base with residual patchy consolidation and interstitial opacity in the right middle lobe. New trace left pleural effusion. No pneumothorax. No acute osseous abnormality. IMPRESSION: 1. Decreased now trace right pleural effusion status post thoracentesis. No pneumothorax. 2. Somewhat improved aeration at the right lung base with residual patchy consolidation and interstitial opacity in the right middle lobe suspicious for pneumonia. Followup PA and lateral chest X-ray is recommended in 3-4 weeks following trial of antibiotic therapy to ensure resolution. 3. New trace left pleural effusion. Electronically Signed   By: Titus Dubin M.D.   On: 11/21/2021 11:28  ? ?US THORACENTESIS ASP PLEURAL SPACE W/IMG GUIDE ? ?Result Date: 11/21/2021 ?INDICATION: Patient admitted with right middle lobe pneumonia and found to have right-sided pleural effusion. Interventional radiology asked to perform a diagnostic and therapeutic thoracentesis. EXAM: ULTRASOUND GUIDED THORACENTESIS MEDICATIONS: 1% lidocaine 10 mL COMPLICATIONS: None immediate. PROCEDURE: An ultrasound guided thoracentesis was thoroughly discussed with the patient and questions answered. The benefits, risks, alternatives and complications were also discussed. The patient understands and wishes to  proceed with the procedure. Written consent was obtained. Ultrasound was performed to localize and mark an adequate pocket of fluid in the right chest. The area was then prepped and draped in the normal sterile fashion. 1% Lidocaine was used for local anesthesia. Under ultrasound guidance a 6 Fr Safe-T-Centesis catheter was introduced. Thoracentesis was performed. The catheter was removed and a dressing  applied. FINDINGS: A total of approximately 1.2 L of clear yellow fluid was removed. Samples were sent to the laboratory as requested by the clinical team. IMPRESSION: Successful ultrasound guided right thoracentesis yielding 1.2 L of pleural fluid. Read by: Soyla Dryer, NP Electronically Signed   By: Miachel Roux M.D.   On: 11/21/2021 11:50   ? ?Assessment and Plan ? ?Carly Abbott is a 66 year old female with history of depression with anxiety, multiple sclerosis, vestibular schwannoma, hypertension, history of hyperlipidemia, who presents emergency department for chief concerns of cough and shortness of breath for 4-5 weeks. ? ?Right-sided malignant pleural effusion ?COPD (ON CXR) with 30 years h/o heavy tobacco abuse ?-- patient underwent thoracentesis and 1.2 L fluid removed ?-- pathologist Dr. Reuel Derby-- preliminary report shows positive malignant cells ?-- patient has history of smoking for 30 years. Chest x-ray consistent with emphysema. Right lower lobe fullness?mass ?-- oncology consultation with Dr. Tasia Catchings ?-- discussed preliminary results with patient ?-- discontinue IV antibiotics since clinically does not have pneumonia ? ?anxiety depression ?-- continue home meds ? ?Hyperlipidemia ?-- patient does not take statins any longer. She tells me she was taken off by Dr. Netty Starring ? ?Hypertension ?-- continue amlodipine ? ?history of MS with foot drop ?-- patient follows with Dr. Melrose Nakayama neurology ? ? ? ? ? ?Procedures: right thoracentesis ?Family communication : friend Juliann Pulse ?Consults : oncology ?CODE STATUS: full ?DVT Prophylaxis : heparin ?Level of care: Telemetry Medical ?Status is: Observation ?The patient remains OBS appropriate and will d/c before 2 midnights. ?  ? ?TOTAL TIME TAKING CARE OF THIS PATIENT: 30 minutes.  ?>50% time spent on counselling and coordination of care ? ?Note: This dictation was prepared with Dragon dictation along with smaller phrase technology. Any transcriptional errors that  result from this process are unintentional. ? ?Fritzi Mandes M.D  ? ? ?[facility]  ? ?CC: ?Primary care physician; Dion Body, MD  ?

## 2021-11-22 NOTE — Progress Notes (Signed)
Patient was given verbal and written discharge instructions, she acknowledge understanding and states she will keep all appointments, patient was taken to car by wheelchair on 0.5 liters of oxygen. Patient concentrator and oxygen was delivered to patient. No distress was noted when leaving the floor. ?

## 2021-11-22 NOTE — Code Documentation (Signed)
CODE SEPSIS - PHARMACY COMMUNICATION ? ?**Broad Spectrum Antibiotics should be administered within 1 hour of Sepsis diagnosis** ? ?Time Code Sepsis Called/Page Received: 2902 ? ?Antibiotics Ordered: Rocephin + azithromycin ? ?Time of 1st antibiotic administration: 1850 ? ? ? ? ?Darrick Penna ,PharmD ?Clinical Pharmacist  ?11/22/2021  6:50 PM ? ?

## 2021-11-22 NOTE — ED Notes (Signed)
Pt given ice chips by request ?

## 2021-11-22 NOTE — Progress Notes (Addendum)
CSW contacted [employer] and spoke with jasmine and ordered oxygen for patient. Oxygen will be delivered at bedside ?

## 2021-11-22 NOTE — ED Triage Notes (Signed)
Pt was just dc'd today with PNA and sent home on O2. Pt stating they "had fluid drained off their lungs while they were here." Pt arrives alert and oriented on monitor. ?

## 2021-11-22 NOTE — Assessment & Plan Note (Addendum)
Continue Zoloft and Valium ?

## 2021-11-22 NOTE — H&P (Addendum)
?History and Physical  ? ?Carly Abbott:[id number] DOB: 11-17-1955 DOA: 11/22/2021 ? ?PCP: Dion Body, MD  ?Outpatient Specialists: Dr. [facility] neurology ?Patient coming from: Home via EMS ? ?I have personally briefly reviewed patient's old medical records in [place]. ? ?Chief Concern: Shortness of breath ? ?HPI: Ms. Carly Abbott is a 66 year old female with history of depression with anxiety, multiple sclerosis, vestibular schwannoma, hypertension, history of hyperlipidemia, recent admission for shortness of breath and was found to have right pleural effusion who presents emergency department for chief concerns of shortness of breath. ? ?She was admitted on 11/20/2021 and discharged on 11/22/2021.  She was home for 2 hours and started having symptoms of shortness of breath thus prompting her to re-present to the emergency department. ? ?Initial vitals in the emergency department showed temperature of 98.3, respiration rate of 23 and improved to 18, initial heart rate was 104 and improved to 87, blood pressure 113/62, SPO2 of 94% on 2 L nasal cannula and was increased to 4 L nasal cannula. ? ?VBG showed 7.4 4/38/62. Lactic acid was 1.7. COVID/influenza A/influenza B PCR were negative. ? ?Serum sodium 137, potassium 3.6, chloride 106, bicarb 25, BUN of 17, serum creatinine of 0.82, GFR greater than 60, nonfasting blood glucose 142, WBC 10.2, hemoglobin 11.9, platelets of 449. ? ?ED treatment: Azithromycin 500 mg IV, ceftriaxone 2 g IV, lactated ringer 1 L bolus and patient was started on LR infusion at 150 mL/h. ? ?At bedside she is awake and alert and oriented x4.  She states that she was able to participate with physical therapy on 11/21/2021 and did well.  However on day of discharge, the patient was wheeled on a wheelchair and did not exert herself too much in the hospital. ? ?When she got out of the car in front of her house she did not even make it to the steps of her house when she felt  worsening shortness of breath.  She did not make it into the house.  EMS then transported her back to the hospital to the emergency department for further evaluation. ? ?She denies chest pain, fever, dysuria, hematuria, constipation, diarrhea, abdominal pain. ? ?She states that her breathing feels very labored at this time. ? ?She further states that at this time she does not want to be resuscitated if her heart were to stop beating and as she was up prior hospice nurse, she does not want to be intubated. ? ?General health maintenance: Patient states that she is overdue for colonoscopy. ? ?Social history: She lives at home by herself.  She denies EtOH, tobacco use, history of recreational drug use.  She is retired and formerly worked as a Barrister's clerk. ? ?Vaccination history: She is vaccinated for COVID-19 and influenza. ? ?ROS: ?Constitutional: no weight change, no fever ?ENT/Mouth: no sore throat, no rhinorrhea ?Eyes: no eye pain, no vision changes ?Cardiovascular: no chest pain, + dyspnea,  no edema, no palpitations ?Respiratory: + cough, no sputum, no wheezing ?Gastrointestinal: no nausea, no vomiting, no diarrhea, no constipation ?Genitourinary: no urinary incontinence, no dysuria, no hematuria ?Musculoskeletal: no arthralgias, no myalgias ?Skin: no skin lesions, no pruritus, ?Neuro: + weakness, no loss of consciousness, no syncope ?Psych: no anxiety, no depression, + decrease appetite ?Heme/Lymph: no bruising, no bleeding ? ?ED Course: Discussed with emergency medicine provider, patient requiring hospitalization for chief concerns of shortness of breath with reaccumulation of right pleural effusion. ? ?Assessment/Plan ? ?Principal Problem: ?  Pleural effusion on  right ?Active Problems: ?  MS (multiple sclerosis) ([place]) ?  Essential hypertension ?  Hyperlipidemia ?  Anxiety and depression ?  S/P thoracentesis ?  Hypoalbuminemia due to protein-calorie malnutrition ([place]) ?  SIRS (systemic inflammatory response  syndrome) ([facility]) ?  Cough ?  Mid back pain ?  ?Assessment and Plan: ? ?* Pleural effusion on right ?- Ultrasound-guided right-sided thoracentesis ordered, no labs at this time ?- I discussed with patient regarding pigtail catheter given that her moderate right-sided pleural effusion reaccumulated quickly after thoracentesis to avoid frequent hospitalization and readmission ?- Patient endorses understanding and states that she would appreciate having of pigtail catheter instead of frequent thoracentesis ?- Recommend a.m. team to discuss with IR team for pleural pigtail catheter insertion ? ?Mid back pain ?- Lidocaine patch twice daily ordered ? ?Cough ?- With dyspnea ?- Patient feels like there is phlegm she needs to get out but is having difficulty ?- Guaifenesin 600 mg p.o. twice daily ordered ?- Incentive spirometry and flutter valve ordered, counseled patient on using and she states that she will ? ?SIRS (systemic inflammatory response syndrome) (HCC) ?- Patient met SIRS criteria with increased respiration rate and heart rate, I have low clinical suspicion for infectious etiology at this time given that lactic acid is 1.7, WBC is within normal limits, patient does not have a fever and recent procalcitonin on 11/20/2021 was negative ?- I have canceled antibiotic ordered in the emergency department at this time ?- I am ordering an additional procalcitonin, if negative no further indications for antibiotic at this time and patient is meeting SIRS criteria due to reaccumulation of right pleural effusion ?- Blood cultures x2 are in process, second lactic acid is in process, procalcitonin is in process ? ?Hypoalbuminemia due to protein-calorie malnutrition ([place]) ?- Registered dietitian has been consulted ? ?S/P thoracentesis ?- Patient is status post right-sided ultrasound-guided thoracentesis yielding 1.2 L of clear yellow fluid on 11/21/2021 ?- Positive for malignant cells seen on prior labs collected on 11/21/2021 ?-  Patient will need outpatient follow-up with medical oncology ?- Counseled patient that she will need to get outpatient colonoscopy ? ?Anxiety and depression ?- Resumed home duloxetine 30 mg daily ?- Ativan 0.5 mg IV nightly as needed for anxiety, 3 doses ordered ? ?Hyperlipidemia ?- Patient currently not on anticholesterol medication ? ?MS (multiple sclerosis) ([place]) ?- Patient states she gets muscle spasms severely and is very difficult for her to sleep due to the MS ?- Resumed home cyclobenzaprine 10 mg p.o. twice daily ? ?Chart reviewed.  ? ?Hospitalization from 11/20/2021 to 11/22/2021: Malignant pleural effusion, positive for malignant cells work-up as outpatient with Dr. Rigoberto Noel and COPD on imaging study.  She is status post ultrasound-guided thoracentesis with 1.2 L of fluid removed.  Pathologist, Dr. Reuel Derby preliminary report shows positive malignant cells.  Oncology was consulted, Dr. Tasia Catchings saw the patient.  Patient was found to have new hypoxia and was discharged home with 2 L of home oxygen. ? ?DVT prophylaxis: Heparin 5000 units subcutaneous one-time dose ordered ?Code Status: DNR/DNI ?Diet: Heart healthy ?Family Communication: No ?Disposition Plan: Pending thoracentesis ?Consults called: IR, dietitian ?Admission status: Telemetry medical, observation ? ?Past Medical History:  ?Diagnosis Date  ? Depression   ? Glaucoma   ? Headache   ? Hyperlipidemia   ? Multiple sclerosis ([place])   ? Spinal stenosis   ? ?Past Surgical History:  ?Procedure Laterality Date  ? BACK SURGERY    ? CARPAL TUNNEL RELEASE    ? CHOLECYSTECTOMY    ?  COLONOSCOPY    ? COLONOSCOPY WITH PROPOFOL N/A 05/18/2016  ? Procedure: COLONOSCOPY WITH PROPOFOL;  Surgeon: Manya Silvas, MD;  Location: [facility] ENDOSCOPY;  Service: Endoscopy;  Laterality: N/A;  ? LEG ANGIOGRAPHY    ? TONSILLECTOMY    ? ?Social History:  reports that she has never smoked. She has never used smokeless tobacco. She reports that she does not drink alcohol and does not use  drugs. ? ?Allergies  ?Allergen Reactions  ? Cardizem [Diltiazem Hcl] Itching  ? Other Hives  ?  [place]   ? ?Family History  ?Problem Relation Age of Onset  ? Colon cancer Mother   ? Hypertension Father   ? Rheum arthr

## 2021-11-23 ENCOUNTER — Encounter: Payer: Self-pay | Admitting: Internal Medicine

## 2021-11-23 DIAGNOSIS — F32A Depression, unspecified: Secondary | ICD-10-CM | POA: Diagnosis present

## 2021-11-23 DIAGNOSIS — I3131 Malignant pericardial effusion in diseases classified elsewhere: Secondary | ICD-10-CM | POA: Diagnosis not present

## 2021-11-23 DIAGNOSIS — C3490 Malignant neoplasm of unspecified part of unspecified bronchus or lung: Secondary | ICD-10-CM | POA: Diagnosis present

## 2021-11-23 DIAGNOSIS — R6510 Systemic inflammatory response syndrome (SIRS) of non-infectious origin without acute organ dysfunction: Secondary | ICD-10-CM | POA: Diagnosis present

## 2021-11-23 DIAGNOSIS — J9601 Acute respiratory failure with hypoxia: Secondary | ICD-10-CM | POA: Diagnosis not present

## 2021-11-23 DIAGNOSIS — E876 Hypokalemia: Secondary | ICD-10-CM | POA: Diagnosis present

## 2021-11-23 DIAGNOSIS — E8809 Other disorders of plasma-protein metabolism, not elsewhere classified: Secondary | ICD-10-CM | POA: Diagnosis present

## 2021-11-23 DIAGNOSIS — Z7189 Other specified counseling: Secondary | ICD-10-CM | POA: Diagnosis not present

## 2021-11-23 DIAGNOSIS — I3139 Other pericardial effusion (noninflammatory): Secondary | ICD-10-CM | POA: Diagnosis present

## 2021-11-23 DIAGNOSIS — J189 Pneumonia, unspecified organism: Secondary | ICD-10-CM | POA: Diagnosis not present

## 2021-11-23 DIAGNOSIS — Z66 Do not resuscitate: Secondary | ICD-10-CM | POA: Diagnosis present

## 2021-11-23 DIAGNOSIS — F419 Anxiety disorder, unspecified: Secondary | ICD-10-CM | POA: Diagnosis present

## 2021-11-23 DIAGNOSIS — E785 Hyperlipidemia, unspecified: Secondary | ICD-10-CM | POA: Diagnosis present

## 2021-11-23 DIAGNOSIS — Z515 Encounter for palliative care: Secondary | ICD-10-CM | POA: Diagnosis not present

## 2021-11-23 DIAGNOSIS — J90 Pleural effusion, not elsewhere classified: Secondary | ICD-10-CM

## 2021-11-23 DIAGNOSIS — J910 Malignant pleural effusion: Secondary | ICD-10-CM | POA: Diagnosis present

## 2021-11-23 DIAGNOSIS — E43 Unspecified severe protein-calorie malnutrition: Secondary | ICD-10-CM | POA: Diagnosis present

## 2021-11-23 DIAGNOSIS — R64 Cachexia: Secondary | ICD-10-CM | POA: Diagnosis present

## 2021-11-23 DIAGNOSIS — R634 Abnormal weight loss: Secondary | ICD-10-CM | POA: Diagnosis not present

## 2021-11-23 DIAGNOSIS — J9621 Acute and chronic respiratory failure with hypoxia: Secondary | ICD-10-CM | POA: Diagnosis present

## 2021-11-23 DIAGNOSIS — F17200 Nicotine dependence, unspecified, uncomplicated: Secondary | ICD-10-CM | POA: Diagnosis present

## 2021-11-23 DIAGNOSIS — R0602 Shortness of breath: Secondary | ICD-10-CM | POA: Diagnosis not present

## 2021-11-23 DIAGNOSIS — K5900 Constipation, unspecified: Secondary | ICD-10-CM | POA: Diagnosis present

## 2021-11-23 DIAGNOSIS — Z681 Body mass index (BMI) 19 or less, adult: Secondary | ICD-10-CM | POA: Diagnosis not present

## 2021-11-23 DIAGNOSIS — I959 Hypotension, unspecified: Secondary | ICD-10-CM | POA: Diagnosis present

## 2021-11-23 DIAGNOSIS — J432 Centrilobular emphysema: Secondary | ICD-10-CM | POA: Diagnosis present

## 2021-11-23 DIAGNOSIS — C3400 Malignant neoplasm of unspecified main bronchus: Secondary | ICD-10-CM | POA: Diagnosis not present

## 2021-11-23 DIAGNOSIS — R531 Weakness: Secondary | ICD-10-CM | POA: Diagnosis not present

## 2021-11-23 DIAGNOSIS — C799 Secondary malignant neoplasm of unspecified site: Secondary | ICD-10-CM | POA: Diagnosis present

## 2021-11-23 DIAGNOSIS — G893 Neoplasm related pain (acute) (chronic): Secondary | ICD-10-CM | POA: Diagnosis present

## 2021-11-23 DIAGNOSIS — I10 Essential (primary) hypertension: Secondary | ICD-10-CM | POA: Diagnosis present

## 2021-11-23 DIAGNOSIS — Z20822 Contact with and (suspected) exposure to covid-19: Secondary | ICD-10-CM | POA: Diagnosis present

## 2021-11-23 DIAGNOSIS — G35 Multiple sclerosis: Secondary | ICD-10-CM | POA: Diagnosis present

## 2021-11-23 DIAGNOSIS — F418 Other specified anxiety disorders: Secondary | ICD-10-CM | POA: Diagnosis not present

## 2021-11-23 DIAGNOSIS — D75839 Thrombocytosis, unspecified: Secondary | ICD-10-CM | POA: Diagnosis present

## 2021-11-23 LAB — BASIC METABOLIC PANEL
Anion gap: 7 (ref 5–15)
BUN: 16 mg/dL (ref 8–23)
CO2: 27 mmol/L (ref 22–32)
Calcium: 8.3 mg/dL — ABNORMAL LOW (ref 8.9–10.3)
Chloride: 104 mmol/L (ref 98–111)
Creatinine, Ser: 0.58 mg/dL (ref 0.44–1.00)
GFR, Estimated: >60 mL/min (ref >60)
Glucose, Bld: 101 mg/dL — ABNORMAL HIGH (ref 70–99)
Potassium: 3.4 mmol/L — ABNORMAL LOW (ref 3.5–5.1)
Sodium: 138 mmol/L (ref 135–145)

## 2021-11-23 LAB — APTT: aPTT: 31 seconds (ref 24–36)

## 2021-11-23 LAB — CBC
HCT: 37.8 % (ref 36.0–46.0)
Hemoglobin: 12.1 g/dL (ref 12.0–15.0)
MCH: 29 pg (ref 26.0–34.0)
MCHC: 32 g/dL (ref 30.0–36.0)
MCV: 90.6 fL (ref 80.0–100.0)
Platelets: 440 10*3/uL — ABNORMAL HIGH (ref 150–400)
RBC: 4.17 MIL/uL (ref 3.87–5.11)
RDW: 12.6 % (ref 11.5–15.5)
WBC: 9 10*3/uL (ref 4.0–10.5)
nRBC: 0 % (ref 0.0–0.2)

## 2021-11-23 LAB — PROTIME-INR
INR: 1 (ref 0.8–1.2)
Prothrombin Time: 13.1 seconds (ref 11.4–15.2)

## 2021-11-23 MED ORDER — MIDODRINE HCL 5 MG PO TABS
5.0000 mg | ORAL_TABLET | Freq: Two times a day (BID) | ORAL | Status: DC
  Administered 2021-11-23 – 2021-11-29 (×13): 5 mg via ORAL
  Filled 2021-11-23 (×13): qty 1

## 2021-11-23 MED ORDER — POTASSIUM CHLORIDE CRYS ER 20 MEQ PO TBCR
20.0000 meq | EXTENDED_RELEASE_TABLET | Freq: Once | ORAL | Status: AC
Start: 2021-11-23 — End: 2021-11-23
  Administered 2021-11-23: 20 meq via ORAL
  Filled 2021-11-23: qty 1

## 2021-11-23 MED ORDER — MORPHINE SULFATE (PF) 2 MG/ML IV SOLN
1.0000 mg | INTRAVENOUS | Status: DC | PRN
  Administered 2021-11-23 – 2021-11-25 (×4): 1 mg via INTRAVENOUS
  Filled 2021-11-23 (×4): qty 1

## 2021-11-23 NOTE — Progress Notes (Addendum)
IR aware of consult for Pleurex placement. Schedule permitting, will be placed 5/8. Orders placed for NPO status.  ? ? ?Narda Rutherford, AGNP-BC ?11/23/2021, 9:18 AM ? ? ?

## 2021-11-23 NOTE — Evaluation (Signed)
Physical Therapy Evaluation ?Patient Details ?Name: Carly Abbott ?MRN: [id number] ?DOB: 02-25-1956 ?Today's Date: 11/23/2021 ? ?History of Present Illness ? Pt is a 66 year old female with history of depression with anxiety, multiple sclerosis, vestibular schwannoma, hypertension, history of hyperlipidemia, recent admission for shortness of breath and was found to have right pleural effusion who presents emergency department for chief concerns of shortness of breath. Pt s/p thoracentesis during recent prior admission now re-admitted with MD assessment that includes: Pleural effusion on right, mild back pain, cough, SIRS, and hypoalbuminemia due to protein-calorie malnutrition. ?  ?Clinical Impression ? Pt was pleasant and motivated to participate during the session and put forth good effort throughout. Pt found on 5LO2/min with SpO2 and HR WNL during the session.  Pt required no physical assistance during the session but presented with limited activity tolerance secondary to SOB that the pt reported as a max of 5/10 during gait.  Pt is anticipated to make good progress towards goals while in acute care and will benefit from HHPT upon discharge to safely address deficits listed in patient problem list for decreased caregiver assistance and eventual return to PLOF. ? ?   ?   ? ?Recommendations for follow up therapy are one component of a multi-disciplinary discharge planning process, led by the attending physician.  Recommendations may be updated based on patient status, additional functional criteria and insurance authorization. ? ?Follow Up Recommendations Home health PT ? ?  ?Assistance Recommended at Discharge Frequent or constant Supervision/Assistance  ?Patient can return home with the following ? A little help with walking and/or transfers;A little help with bathing/dressing/bathroom;Assistance with cooking/housework;Assist for transportation;Help with stairs or ramp for entrance ? ?  ?Equipment Recommendations  Rolling walker (2 wheels)  ?Recommendations for Other Services ?    ?  ?Functional Status Assessment Patient has had a recent decline in their functional status and demonstrates the ability to make significant improvements in function in a reasonable and predictable amount of time.  ? ?  ?Precautions / Restrictions Precautions ?Precautions: Fall ?Restrictions ?Weight Bearing Restrictions: No  ? ?  ? ?Mobility ? Bed Mobility ?Overal bed mobility: Modified Independent ?  ?  ?  ?  ?  ?  ?General bed mobility comments: Extra time and effort only ?  ? ?Transfers ?Overall transfer level: Needs assistance ?Equipment used: Rolling walker (2 wheels) ?Transfers: Sit to/from Stand ?Sit to Stand: Min guard ?  ?  ?  ?  ?  ?General transfer comment: Min verbal cues for hand placement with fair eccentric and concentric control and stability ?  ? ?Ambulation/Gait ?Ambulation/Gait assistance: Min guard ?Gait Distance (Feet): 3 Feet ?Assistive device: Rolling walker (2 wheels) ?Gait Pattern/deviations: Decreased dorsiflexion - left, Step-through pattern, Trunk flexed ?Gait velocity: decreased ?  ?  ?General Gait Details: Slow cadence but steady without LOB or buckling ? ?Stairs ?  ?  ?  ?  ?  ? ?Wheelchair Mobility ?  ? ?Modified Rankin (Stroke Patients Only) ?  ? ?  ? ?Balance Overall balance assessment: Needs assistance ?Sitting-balance support: No upper extremity supported, Feet supported ?Sitting balance-Leahy Scale: Good ?  ?  ?Standing balance support: Bilateral upper extremity supported, During functional activity ?Standing balance-Leahy Scale: Fair ?  ?  ?  ?  ?  ?  ?  ?  ?  ?  ?  ?  ?   ? ? ? ?Pertinent Vitals/Pain Pain Assessment ?Pain Assessment: 0-10 ?Pain Score: 6  ?Pain Location: back ?Pain Descriptors / Indicators: Sore ?  Pain Intervention(s): Repositioned, Premedicated before session, Monitored during session  ? ? ?Home Living Family/patient expects to be discharged to:: Private residence ?Living Arrangements:  Alone ?Available Help at Discharge: Friend(s);Available 24 hours/day ?Type of Home: House ?Home Access: Stairs to enter ?Entrance Stairs-Rails: Right ?Entrance Stairs-Number of Steps: 3 ?  ?Home Layout: One level ?Home Equipment: Wheelchair - manual ?Additional Comments: Son will be staying with patient at discharge  ?  ?Prior Function Prior Level of Function : Independent/Modified Independent ?  ?  ?  ?  ?  ?  ?Mobility Comments: Ind amb without an AD, no fall history ?ADLs Comments: Ind with ADLs, groceries delivered ?  ? ? ?Hand Dominance  ?   ? ?  ?Extremity/Trunk Assessment  ? Upper Extremity Assessment ?Upper Extremity Assessment: Generalized weakness ?  ? 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  ? ?   ?Communication  ? Communication: No difficulties  ?Cognition Arousal/Alertness: Awake/alert ?Behavior During Therapy: [facility] for tasks assessed/performed ?Overall Cognitive Status: Within Functional Limits for tasks assessed ?  ?  ?  ?  ?  ?  ?  ?  ?  ?  ?  ?  ?  ?  ?  ?  ?  ?  ?  ? ?  ?General Comments   ? ?  ?Exercises Total Joint Exercises ?Ankle Circles/Pumps: AROM, Strengthening, Both, 10 reps (limited ROM on the L) ?Quad Sets: Strengthening, Both, 10 reps ?Gluteal Sets: Strengthening, Both, 10 reps ?Hip ABduction/ADduction: Strengthening, AAROM, Both, 5 reps ?Straight Leg Raises: AAROM, Strengthening, Both, 5 reps ?Long [employer]: Strengthening, Both, 10 reps ?Knee Flexion: Strengthening, Both, 10 reps ?Other Exercises ?Other Exercises: HEP education for BLE APs, QS, LAQs, and GS  ? ?Assessment/Plan  ?  ?PT Assessment Patient needs continued PT services  ?PT Problem List Decreased strength;Decreased mobility;Decreased activity tolerance;Decreased balance;Decreased knowledge of use of  DME ? ?   ?  ?PT Treatment Interventions DME instruction;Therapeutic exercise;Gait training;Stair training;Neuromuscular re-education;Functional mobility training;Therapeutic activities;Patient/family education   ? ?PT Goals (Current goals can be found in the Care Plan section)  ?Acute Rehab PT Goals ?Patient Stated Goal: To get stronger and return home ?PT Goal Formulation: With patient ?Time For Goal Achievement: 12/06/21 ?Potential to Achieve Goals: Good ? ?  ?Frequency Min 2X/week ?  ? ? ?Co-evaluation   ?  ?  ?  ?  ? ? ?  ?AM-PAC PT "6 Clicks" Mobility  ?Outcome Measure Help needed turning from your back to your side while in a flat bed without using bedrails?: A Little ?Help needed moving from lying on your back to sitting on the side of a flat bed without using bedrails?: A Little ?Help needed moving to and from a bed to a chair (including a wheelchair)?: A Little ?Help needed standing up from a chair using your arms (e.g., wheelchair or bedside chair)?: A Little ?Help needed to walk in hospital room?: A Lot ?Help needed climbing 3-5 steps with a railing? : A Lot ?6 Click Score: 16 ? ?  ?End of Session Equipment Utilized During Treatment: Gait belt;Oxygen ?Activity Tolerance: Patient tolerated treatment well ?Patient left: in chair;with call bell/phone within reach;with chair alarm set ?Nurse Communication: Mobility status ?PT Visit Diagnosis: Unsteadiness on feet (R26.81);Muscle weakness (generalized) (M62.81);Difficulty in walking, not elsewhere classified (R26.2) ?  ? ?  Time: 3009-7949 ?PT Time Calculation (min) (ACUTE ONLY): 27 min ? ? ?Charges:   PT Evaluation ?$PT Eval Moderate Complexity: 1 Mod ?PT Treatments ?$Therapeutic Exercise: 8-22 mins ?  ?   ? ? ?D. Royetta Asal PT, DPT ?11/23/21, 10:45 AM ? ? ?

## 2021-11-23 NOTE — Assessment & Plan Note (Addendum)
Replaced insulin normal range as of 11/28/2021 ?

## 2021-11-23 NOTE — Assessment & Plan Note (Addendum)
Hypotension and tachycardia on low-dose midodrine. ?

## 2021-11-23 NOTE — Progress Notes (Signed)
?Progress Note ? ? ?Patient: Carly Abbott ZOX:[id number] DOB: 12-29-1955 DOA: 11/22/2021     0 ?DOS: the patient was seen and examined on 11/23/2021 ?  ?Brief hospital course: ?Ms. 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  She was reportedly too short of breath to make it into her home, and returned to the ED. ? ?Her O2 requirement increased from 2 >> 4 L/min, later up to 8 L/min, with tachypnea and mild tachycardia. ?Chest xray showed some re-accumulation of the R pleural effusion since the thoracentesis on 5/5. ? ?Admitted to hospitalist service.  IR consulted for placement of PleurX pigtail catheter, as patient prefers not to have to undergo frequent serial paracenteses. ? ? ?5/7: requiring low dose morphine to control dyspnea.  IR plans for drain placement tomorrow.  ? ?Assessment and Plan: ?* Pleural effusion on right ?Patient underwent thoracentesis on 5/5 with 1.2 L fluid removed.  Preliminary fluid studies reflect malignancy. ?--IR consulted for Pleurx drainage catheter, plan is for placement tomorrow ?-- Canceled thoracentesis order ?-- N.p.o. after midnight ?-- Oncology outpatient follow-up ? ?Hypokalemia ?K 3.4 today, replacing. ?Monitor and replace K PRN ? ?Hypotension ?BP in the 90s over 60s today. ?-- Hold amlodipine ?-- Started low-dose midodrine ?-- Maintain MAP above 65 ? ?Mid back pain ?Lidocaine patch ? ?Cough ?In setting of pleural effusion and ongoing dyspnea.  Patient reported sensation of phlegm, with difficulty expectorating ?- Scheduled Mucinex twice  daily ?- Incentive spirometry and flutter valve  ? ?SIRS (systemic inflammatory response syndrome) (HCC) ?On admission, patient met SIRS criteria with tachypnea and tachycardia.  No evidence for active infection  ?-- Hold off antibiotics ?- Follow blood cultures ?-- Monitor for signs or symptoms of infection ? ?Hypoalbuminemia due to protein-calorie malnutrition ([place]) ?Dietitian consulted ? ?S/P thoracentesis ?Patient is status post right-sided ultrasound-guided thoracentesis yielding 1.2 L of clear yellow fluid on 11/21/2021 with ?preliminary fluid studies positive for malignant cells  ? ?--outpatient follow-up with medical oncology ? ?Anxiety and depression ?--Continue home duloxetine 30 mg daily. ?--Ativan 0.5 mg IV nightly as needed ? ?Hyperlipidemia ?Appears not on medication ? ?Essential hypertension ?BP soft in the 90s over 60s today. ?-- Hold home amlodipine ?--Started low-dose midodrine for BP to tolerate morphine for severe dyspnea ?--Monitor BP, maintain MAP above 65 ? ? ?MS (multiple sclerosis) ([place]) ?Patient reports muscle spasms causes focal to the sleeping at times  ?-- Continue home Flexeril 10 mg p.o. twice daily ? ? ? ? ?  ? ?Subjective: Patient sitting up in recliner when seen today.  She still appears short of breath and has significant conversational dyspnea.  He has coughing episodes triggered by inhalation it seems.  She is agreeable to pigtail drainage catheter.  Explained this will be placed tomorrow with radiology.  She is quite uncomfortable with her shortness of breath, agreeable to use low-dose morphine to help with this.  No other acute complaints at this time.  Patient reports her blood pressure at baseline runs on the low side. ? ?Physical Exam: ?Vitals:  ? 11/23/21 0530 11/23/21 0807 11/23/21 1118 11/23/21 1227  ?BP: 1$Remo'11/63 97/61 97/69 'eUDNs$ (!)  109/56  ?Pulse: 99 98  92  ?Resp: 20 20 20 16   ?Temp: 98.5 ?F (36.9 ?C) 98.2 ?F (36.8 ?C)  97.8 ?F (36.6 ?C)  ?TempSrc:      ?SpO2: 97% 96% 97% 97%   ?Weight:      ?Height:      ? ?General exam: awake, alert, no acute distress, frail ?HEENT: moist mucus membranes, hearing grossly normal  ?Respiratory system: Frequent coughing, conversational dyspnea, accessory muscle use, diminished right base but otherwise lungs clear without wheezes or rhonchi  ?Cardiovascular system: normal S1/S2, RRR, no pedal edema.   ?Central nervous system: A&O x4. no gross focal neurologic deficits, normal speech ?Extremities: moves all, no cyanosis, normal tone ?Skin: dry, intact, normal temperature ?Psychiatry: normal mood, congruent affect, judgement and insight appear normal ? ? ?Data Reviewed: ? ?Notable labs: K3.4, glucose 101, calcium 8.3, platelets 440 ? ?Family Communication: None ? ?Disposition: ?Status is: Observation ?The patient remains OBS appropriate and will d/c before 2 midnights. ? Planned Discharge Destination: Home ? ? ? ?Time spent: 35 minutes ? ?Author: ?Ezekiel Slocumb, DO ?11/23/2021 12:30 PM ? ?For on call review [email or web address].  ?

## 2021-11-24 ENCOUNTER — Encounter: Payer: Self-pay | Admitting: *Deleted

## 2021-11-24 ENCOUNTER — Inpatient Hospital Stay: Payer: Medicare Other | Admitting: Radiology

## 2021-11-24 ENCOUNTER — Encounter: Payer: Self-pay | Admitting: Internal Medicine

## 2021-11-24 DIAGNOSIS — J90 Pleural effusion, not elsewhere classified: Secondary | ICD-10-CM | POA: Diagnosis not present

## 2021-11-24 DIAGNOSIS — R634 Abnormal weight loss: Secondary | ICD-10-CM | POA: Diagnosis present

## 2021-11-24 DIAGNOSIS — E43 Unspecified severe protein-calorie malnutrition: Secondary | ICD-10-CM | POA: Insufficient documentation

## 2021-11-24 HISTORY — PX: IR PERC PLEURAL DRAIN W/INDWELL CATH W/IMG GUIDE: IMG5383

## 2021-11-24 LAB — COMP PANEL: LEUKEMIA/LYMPHOMA

## 2021-11-24 LAB — BODY FLUID CULTURE W GRAM STAIN: Culture: NO GROWTH

## 2021-11-24 MED ORDER — LIDOCAINE 5 % EX PTCH: 1.0000 | MEDICATED_PATCH | CUTANEOUS | Status: DC

## 2021-11-24 MED ORDER — MIDAZOLAM HCL 2 MG/2ML IJ SOLN
INTRAMUSCULAR | Status: AC
  Filled 2021-11-24: qty 2

## 2021-11-24 MED ORDER — LIDOCAINE HCL 1 % IJ SOLN
INTRAMUSCULAR | Status: DC | PRN
  Administered 2021-11-24: 15 mL via INTRADERMAL

## 2021-11-24 MED ORDER — FENTANYL CITRATE (PF) 100 MCG/2ML IJ SOLN
INTRAMUSCULAR | Status: AC
  Filled 2021-11-24: qty 2

## 2021-11-24 MED ORDER — MIDAZOLAM HCL 2 MG/2ML IJ SOLN
INTRAMUSCULAR | Status: AC | PRN
  Administered 2021-11-24 (×2): 1 mg via INTRAVENOUS

## 2021-11-24 MED ORDER — CEFAZOLIN SODIUM-DEXTROSE 2-4 GM/100ML-% IV SOLN
2.0000 g | INTRAVENOUS | Status: AC
  Administered 2021-11-24: 2 g via INTRAVENOUS
  Filled 2021-11-24: qty 100

## 2021-11-24 MED ORDER — FENTANYL CITRATE (PF) 100 MCG/2ML IJ SOLN
INTRAMUSCULAR | Status: AC | PRN
  Administered 2021-11-24: 50 ug via INTRAVENOUS
  Administered 2021-11-24 (×2): 25 ug via INTRAVENOUS

## 2021-11-24 MED ORDER — ENSURE ENLIVE PO LIQD
237.0000 mL | Freq: Three times a day (TID) | ORAL | Status: DC
  Administered 2021-11-24 – 2021-11-29 (×9): 237 mL via ORAL

## 2021-11-24 MED ORDER — KETOROLAC TROMETHAMINE 15 MG/ML IJ SOLN
7.5000 mg | Freq: Once | INTRAMUSCULAR | Status: AC
  Administered 2021-11-24: 7.5 mg via INTRAVENOUS
  Filled 2021-11-24: qty 1

## 2021-11-24 NOTE — Progress Notes (Signed)
Physical Therapy Treatment ?Patient Details ?Name: Carly Abbott ?MRN: [id number] ?DOB: Jun 03, 1956 ?Today's Date: 11/24/2021 ? ? ?History of Present Illness Pt is a 66 year old female with history of depression with anxiety, multiple sclerosis, vestibular schwannoma, hypertension, history of hyperlipidemia, recent admission for shortness of breath and was found to have right pleural effusion who presents emergency department for chief concerns of shortness of breath. Pt s/p thoracentesis during recent prior admission now re-admitted with MD assessment that includes: Pleural effusion on right, mild back pain, cough, SIRS, and hypoalbuminemia due to protein-calorie malnutrition. ? ?  ?PT Comments  ? ? In chair after recent OT session but wanting to walk again before procedure today.  5 lpm - sats stay high 90's with rest and gait but does endorse feeling somewhat SOB.  Stood and walked 10' with RW and min guard.  Participated in exercises as described below.   ? ?Discussed home with pt.  She lives alone with limited support.  At this point she stated she feels good with discharge home after procedure but I do have concerns on her ability to manage.  Will leave recommendations as they stand for not but she is at high risk for needing SNF if pt/family is not able to provide increased support at home. ?  ?Recommendations for follow up therapy are one component of a multi-disciplinary discharge planning process, led by the attending physician.  Recommendations may be updated based on patient status, additional functional criteria and insurance authorization. ? ?Follow Up Recommendations ? Home health PT ?  ?  ?Assistance Recommended at Discharge Frequent or constant Supervision/Assistance  ?Patient can return home with the following A little help with walking and/or transfers;A little help with bathing/dressing/bathroom;Assistance with cooking/housework;Assist for transportation;Help with stairs or ramp for entrance ?   ?Equipment Recommendations ? Rolling walker (2 wheels)  ?  ?Recommendations for Other Services   ? ? ?  ?Precautions / Restrictions Precautions ?Precautions: Fall ?Restrictions ?Weight Bearing Restrictions: No  ?  ? ?Mobility ? Bed Mobility ?  ?  ?  ?  ?  ?  ?  ?General bed mobility comments: in chair before and after ?  ? ?Transfers ?Overall transfer level: Needs assistance ?Equipment used: Rolling walker (2 wheels) ?Transfers: Sit to/from Stand ?Sit to Stand: Min guard ?  ?  ?  ?  ?  ?  ?  ? ?Ambulation/Gait ?Ambulation/Gait assistance: Min guard, Min assist ?Gait Distance (Feet): 10 Feet ?Assistive device: Rolling walker (2 wheels) ?Gait Pattern/deviations: Decreased dorsiflexion - left, Step-through pattern, Trunk flexed ?Gait velocity: decreased ?  ?  ?General Gait Details: Slow cadence but steady without LOB or buckling ? ? ?Stairs ?  ?  ?  ?  ?  ? ? ?Wheelchair Mobility ?  ? ?Modified Rankin (Stroke Patients Only) ?  ? ? ?  ?Balance Overall balance assessment: Needs assistance ?Sitting-balance support: No upper extremity supported, Feet supported ?Sitting balance-Leahy Scale: Good ?  ?  ?Standing balance support: Bilateral upper extremity supported, During functional activity ?Standing balance-Leahy Scale: Fair ?  ?  ?  ?  ?  ?  ?  ?  ?  ?  ?  ?  ?  ? ?  ?Cognition Arousal/Alertness: Awake/alert ?Behavior During Therapy: [facility] for tasks assessed/performed ?Overall Cognitive Status: Within Functional Limits for tasks assessed ?  ?  ?  ?  ?  ?  ?  ?  ?  ?  ?  ?  ?  ?  ?  ?  ?  ?  ?  ? ?  ?  Exercises Other Exercises ?Other Exercises: standing AROM with walker x 10 reps and min gaurd for safety ? ?  ?General Comments   ?  ?  ? ?Pertinent Vitals/Pain Pain Assessment ?Pain Assessment: No/denies pain  ? ? ?Home Living   ?  ?  ?  ?  ?  ?  ?  ?  ?  ?   ?  ?Prior Function    ?  ?  ?   ? ?PT Goals (current goals can now be found in the care plan section) Progress towards PT goals: Progressing toward goals ? ?   ?Frequency ? ? ? Min 2X/week ? ? ? ?  ?PT Plan Current plan remains appropriate;Other (comment)  ? ? ?Co-evaluation   ?  ?  ?  ?  ? ?  ?AM-PAC PT "6 Clicks" Mobility   ?Outcome Measure ? Help needed turning from your back to your side while in a flat bed without using bedrails?: A Little ?Help needed moving from lying on your back to sitting on the side of a flat bed without using bedrails?: A Little ?Help needed moving to and from a bed to a chair (including a wheelchair)?: A Little ?Help needed standing up from a chair using your arms (e.g., wheelchair or bedside chair)?: A Little ?Help needed to walk in hospital room?: A Lot ?Help needed climbing 3-5 steps with a railing? : A Lot ?6 Click Score: 16 ? ?  ?End of Session Equipment Utilized During Treatment: Gait belt;Oxygen ?Activity Tolerance: Patient tolerated treatment well ?Patient left: in chair;with call bell/phone within reach;with chair alarm set ?Nurse Communication: Mobility status ?PT Visit Diagnosis: Unsteadiness on feet (R26.81);Muscle weakness (generalized) (M62.81);Difficulty in walking, not elsewhere classified (R26.2) ?  ? ? ?Time: 5465-0354 ?PT Time Calculation (min) (ACUTE ONLY): 14 min ? ?Charges:  $Gait Training: 8-22 mins          ?         Chesley Noon, PTA ?11/24/21, 1:14 PM ? ?

## 2021-11-24 NOTE — TOC Initial Note (Signed)
Transition of Care (TOC) - Initial/Assessment Note  ? ? ?Patient Details  ?Name: Carly Abbott ?MRN: [id number] ?Date of Birth: May 09, 1956 ? ?Transition of Care (TOC) CM/SW Contact:    ?Pete Pelt, RN ?Phone Number: ?11/24/2021, 3:22 PM ? ?Clinical Narrative:   Patient lives alone.  She has help from friends on occasion, but is typically independent.  She states her brother is here from out of town and can assist her with transportation and other needs at this time.  She has no concerns about medications. ? ?Patient is amenable to receive DME in room (rolling walker and 3 n 1) and she is amenable to home health.  She states she will look into getting additional assistance through her insurance compay, if offered. ? ?TOC to follow              ? ? ?Expected Discharge Plan: [place] ?Barriers to Discharge: Continued Medical Work up ? ? ?Patient Goals and CMS Choice ?  ?  ?Choice offered to / list presented to : NA ? ?Expected Discharge Plan and Services ?Expected Discharge Plan: [place] ?  ?Discharge Planning Services: CM Consult ?Post Acute Care Choice: Home Health, Durable Medical Equipment ?Living arrangements for the past 2 months: [place] ?                ?DME Arranged: 3-N-1, Walker rolling ?DME Agency: [employer] ?Date DME Agency Contacted: 11/24/21 ?Time DME Agency Contacted: 2836 ?Representative spoke with at DME Agency: via text to [employer] ?HH Arranged: PT, OT ?  ?  ?  ?  ? ?Prior Living Arrangements/Services ?Living arrangements for the past 2 months: [place] ?Lives with:: Self ?Patient language and need for interpreter reviewed:: Yes (no interpreter required) ?Do you feel safe going back to the place where you live?: Yes      ?Need for Family Participation in Patient Care: Yes (Comment) ?Care giver support system in place?: Yes (comment) ?  ?Criminal Activity/Legal Involvement Pertinent to Current Situation/Hospitalization: No - Comment as  needed ? ?Activities of Daily Living ?Home Assistive Devices/Equipment: Shower chair without back, Walker (specify type) ?ADL Screening (condition at time of admission) ?Patient's cognitive ability adequate to safely complete daily activities?: Yes ?Is the patient deaf or have difficulty hearing?: Yes ?Does the patient have difficulty seeing, even when wearing glasses/contacts?: No ?Does the patient have difficulty concentrating, remembering, or making decisions?: No ?Patient able to express need for assistance with ADLs?: Yes ?Does the patient have difficulty dressing or bathing?: No ?Independently performs ADLs?: Yes (appropriate for developmental age) ?Does the patient have difficulty walking or climbing stairs?: No ?Weakness of Legs: Both ?Weakness of Arms/Hands: Both ? ?Permission Sought/Granted ?Permission sought to share information with : Case Manager ?Permission granted to share information with : Yes, Verbal Permission Granted ?   ? Permission granted to share info w AGENCY: home health and DME companies ?   ?   ? ?Emotional Assessment ?Appearance:: Appears stated age ?Attitude/Demeanor/Rapport: Gracious, Engaged ?Affect (typically observed): Pleasant, Appropriate ?Orientation: : Oriented to Self, Oriented to Place, Oriented to  Time, Oriented to Situation ?Alcohol / Substance Use: Not Applicable ?Psych Involvement: No (comment) ? ?Admission diagnosis:  Pleural effusion on right [J90] ?Recurrent right pleural effusion [J90] ?Acute on chronic respiratory failure with hypoxia (HCC) [J96.21] ?Pleural effusion [J90] ?Patient Active Problem List  ? Diagnosis Date Noted  ? Hypotension 11/23/2021  ? Hypokalemia 11/23/2021  ? Pleural effusion 11/23/2021  ?  SIRS (systemic inflammatory response syndrome) ([place]) 11/22/2021  ? Cough 11/22/2021  ? Mid back pain 11/22/2021  ? S/P thoracentesis   ? Hypoalbuminemia due to protein-calorie malnutrition ([place])   ? Community acquired pneumonia 11/20/2021  ? Essential  hypertension 11/20/2021  ? Hyperlipidemia 11/20/2021  ? Anxiety and depression 11/20/2021  ? Pleural effusion on right 11/20/2021  ? MS (multiple sclerosis) ([place]) 09/30/2021  ? ?PCP:  Dion Body, MD ?Pharmacy:   ?[facility] [place] [facility] ?[address] ?[place] [address] ?Phone: [phone] Fax: [phone] ? ? ? ? ?Social Determinants of Health (SDOH) Interventions ?  ? ?Readmission Risk Interventions ?   ? View : No data to display.  ?  ?  ?  ? ? ? ?

## 2021-11-24 NOTE — Plan of Care (Signed)
Consult noted for [place]. In to see patient. Patient is not in room at this time. Will re-attempt at another time.  ?

## 2021-11-24 NOTE — Care Management (Cosign Needed)
Patient requires a 3 n 1 due to patient's impaired mobility, location of restrooms and assistance with transfers ?
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
## 2021-11-24 NOTE — Procedures (Signed)
Interventional Radiology Procedure Note ? ?Date of Procedure: 11/24/2021  ?Procedure: Right pleurX placement  ? ?Findings:  ?1. Right pleurX placement (tunneled pleual drainage catheter)  ? ?Complications: No immediate complications noted.  ? ?Estimated Blood Loss: minimal ? ?Follow-up and Recommendations: ?1. IR drained 1 L today using catheter - do not use again until tomorrow  ?2. Oncology/palliative consult to coordinate outpatient care of drainage catheter  ? ? ?Albin Felling, MD  ?Vascular & Interventional Radiology  ?11/24/2021 4:50 PM ? ? ? ?

## 2021-11-24 NOTE — TOC Progression Note (Signed)
Transition of Care (TOC) - Progression Note  ? ? ?Patient Details  ?Name: CHENE KASINGER ?MRN: [id number] ?Date of Birth: 1955/12/18 ? ?Transition of Care (TOC) CM/SW Contact  ?Pete Pelt, RN ?Phone Number: ?11/24/2021, 3:28 PM ? ?Clinical Narrative:   patient accepted [facility] with [facility], confirmed with cheryl.  DME ordered. ? ? ? ?Expected Discharge Plan: [place] ?Barriers to Discharge: Continued Medical Work up ? ?Expected Discharge Plan and Services ?Expected Discharge Plan: [place] ?  ?Discharge Planning Services: CM Consult ?Post Acute Care Choice: Home Health, Durable Medical Equipment ?Living arrangements for the past 2 months: [place] ?                ?DME Arranged: 3-N-1, [place] rolling ?DME Agency: [employer] ?Date DME Agency Contacted: 11/24/21 ?Time DME Agency Contacted: [phone] ?Representative spoke with at DME Agency: via text to [facility] ?HH Arranged: PT, OT ?  ?  ?  ?  ? ? ?Social Determinants of Health (SDOH) Interventions ?  ? ?Readmission Risk Interventions ?   ? View : No data to display.  ?  ?  ?  ? ? ?

## 2021-11-24 NOTE — Progress Notes (Signed)
11/24/2021 at 0400: ? ?NT alerts this RN that pt is sitting on the side of the bed and screaming for them to exit the room. This RN arrives at the pt bedside. This RN asks pt, "Is everything ok?" Pt starts crying and screams, "No it is not." This RN states, "Can I help you with something?" Pt screams for this RN to "leave the damn room." This RN exits the room. Pt has call bell within reach.  ?

## 2021-11-24 NOTE — Progress Notes (Signed)
Per Dr. Tasia Catchings, pt currently inpatient and will need PET scan scheduled once discharged. Orders placed and will initiate insurance authorization while waiting for discharge. Pt will be called with appt once scheduled.  ?

## 2021-11-24 NOTE — Progress Notes (Signed)
Initial Nutrition Assessment ? ?DOCUMENTATION CODES:  ? ?Severe malnutrition in context of chronic illness ? ?INTERVENTION:  ? ?-Ensure Enlive po TID, each supplement provides 350 kcal and 20 grams of protein ?-MVI with minerals daily ?-Check zinc for potential deficiency (taste changes) ? ?NUTRITION DIAGNOSIS:  ? ?Severe Malnutrition related to chronic illness (MS) as evidenced by energy intake < or equal to 75% for > or equal to 1 month, severe fat depletion, severe muscle depletion. ? ?GOAL:  ? ?Patient will meet greater than or equal to 90% of their needs ? ?MONITOR:  ? ?PO intake, Supplement acceptance, Diet advancement ? ?REASON FOR ASSESSMENT:  ? ?Consult ?Assessment of nutrition requirement/status ? ?ASSESSMENT:  ? ?Pt with history of depression with anxiety, multiple sclerosis, vestibular schwannoma, hypertension, history of hyperlipidemia, recent admission for shortness of breath and was found to have right pleural effusion who presented with progressively worsening shortness of breath. ? ?Pt admitted with rt pleural effusion.  ? ?Reviewed I/O's: -160 ml x 24 hours and +320 ml since admission ? ?UOP: 400 ml x 24 hours ? ?Pt currently NPO for pleurex catheter placement today.  ? ?Spoke with pt and brother at bedside. Pt reports decreased oral intake over the past 3 weeks, which is secondary to taste changes. Per pt, she usually consumes vegetables, fruit, and cheese for meals. However, she shares that she started having tastes changes about 3 weeks ago; everything "either tasted like cardboard" or "sweet things were too sweet". Per pt, she was started on new medications after symptoms started, so she does not think medication side effects are causing this. The only foods that are appealing to her are pizza, chocolate Boost, and [employer].  ?  ?Per pt, "I'm 90 pounds if I'm luck". She estimates she has lost 10-20# within the past month. Reviewed wt hx; wt has been stable over the past 3 months.   ? ?Discussed importance of good meal and supplement intake to promote healing. Discussed possible nutrient deficiencies that may cause taste changes; pt amenable to run labs to check for deficiencies. Pt also amenable to Ensure.  ? ?Palliative care has been consulted for goals of care discussions.  ? ?Labs reviewed: K: 3.4.  ? ?NUTRITION - FOCUSED PHYSICAL EXAM: ? ?Flowsheet Row Most Recent Value  ?Orbital Region Severe depletion  ?Upper Arm Region Severe depletion  ?Thoracic and Lumbar Region Severe depletion  ?Buccal Region Severe depletion  ?Temple Region Severe depletion  ?Clavicle Bone Region Severe depletion  ?Clavicle and Acromion Bone Region Severe depletion  ?Scapular Bone Region Severe depletion  ?Dorsal Hand Severe depletion  ?Patellar Region Severe depletion  ?Anterior Thigh Region Severe depletion  ?Posterior Calf Region Severe depletion  ?Edema (RD Assessment) None  ?Hair Reviewed  ?Eyes Reviewed  ?Mouth Reviewed  ?Skin Reviewed  ?Nails Reviewed  ? ?  ? ?Diet Order:   ?Diet Order   ? ?       ?  Diet NPO time specified Except for: Sips with Meds  Diet effective midnight       ?  ? ?  ?  ? ?  ? ? ?EDUCATION NEEDS:  ? ?Education needs have been addressed ? ?Skin:  Skin Assessment: Skin Integrity Issues: ?Skin Integrity Issues:: Incisions ?Incisions: closed rt posterior back ? ?Last BM:  11/21/21 ? ?Height:  ? ?Ht Readings from Last 1 Encounters:  ?11/22/21 4\' 10"  (1.473 m)  ? ? ?Weight:  ? ?Wt Readings from Last 1 Encounters:  ?11/22/21 40.5 kg  ? ? ?  Ideal Body Weight:  43.9 kg ? ?BMI:  Body mass index is 18.64 kg/m?. ? ?Estimated Nutritional Needs:  ? ?Kcal:  1550-1750 ? ?Protein:  80-95 grams ? ?Fluid:  > 1.5 L ? ? ? ?Loistine Chance, RD, LDN, CDCES ?Registered Dietitian II ?Certified Diabetes Care and Education Specialist ?Please refer to [facility] for RD and/or RD on-call/weekend/after hours pager  ?

## 2021-11-24 NOTE — Consult Note (Signed)
? ?Chief Complaint: ?Patient was seen in consultation today for rapidly recurrent right pleural effusion with concern for malignant effusion at the request of Ezekiel Slocumb, DO ? ?Referring Physician(s): ?Ezekiel Slocumb, DO ?Earlie Server, MD ? ?Supervising Physician: Juliet Rude ? ?Patient Status: [facility] - In-pt ? ?History of Present Illness: ?Carly Abbott is a 66 y.o. female with PMHx of multiple sclerosis, HTN who was recently admitted on 5/4 with complaints of shortness of breath and cough, weight loss and weakness. The patient underwent a right thoracentesis on 5/5 with removal of 1.2L of yellow colored fluid and per chart review the pathologist preliminary read showed malignant cells. The patient was seen by Oncology 5/6 with suspicion for infection versus possible lung malignancy and plan was for oncology work-up as an outpatient. The patient was discharged and she states she became extremely short of breath and unable to even walk inside her house after discharge. She presented back to the ED the same day on 5/6 and CXR revealed interval increase in right pleural effusion when compared to day prior post thoracentesis CXR. The patient required increased oxygen demand and IR was consulted for right sided pleural pleurX catheter placement given rapid re-accumulation and concern for malignant effusion. The patient admits to ongoing shortness of breath. She denies any current blood thinner use, denies any known bleeding or clotting disorder. She has no known complications to sedation. Palliative care has also been consulted to help manage pleurX.  ? ? ?Past Medical History:  ?Diagnosis Date  ? Depression   ? Glaucoma   ? Headache   ? Hyperlipidemia   ? Multiple sclerosis ([place])   ? Spinal stenosis   ? ? ?Past Surgical History:  ?Procedure Laterality Date  ? BACK SURGERY    ? CARPAL TUNNEL RELEASE    ? CHOLECYSTECTOMY    ? COLONOSCOPY    ? COLONOSCOPY WITH PROPOFOL N/A 05/18/2016  ? Procedure: COLONOSCOPY  WITH PROPOFOL;  Surgeon: Manya Silvas, MD;  Location: [facility] ENDOSCOPY;  Service: Endoscopy;  Laterality: N/A;  ? LEG ANGIOGRAPHY    ? TONSILLECTOMY    ? ? ?Allergies: ?Cardizem [diltiazem hcl] and Other ? ?Medications: ?Prior to Admission medications   ?Medication Sig Start Date End Date Taking? Authorizing Provider  ?amLODipine (NORVASC) 5 MG tablet Take 1 tablet (5 mg total) by mouth daily. 09/30/21 12/29/21 Yes Kris Hartmann, NP  ?DULoxetine (CYMBALTA) 30 MG capsule Take 30 mg by mouth daily. 09/19/21  Yes [provider]  ?oxybutynin (DITROPAN) 5 MG tablet Take 5 mg by mouth daily. 10/07/21  Yes [provider]  ?traMADol (ULTRAM) 50 MG tablet Take 50-100 mg by mouth daily. 11/04/21  Yes [provider]  ?acetaminophen (TYLENOL) 500 MG tablet Take 500 mg by mouth every 6 (six) hours as needed.    [provider]  ?cyclobenzaprine (FLEXERIL) 10 MG tablet Take 10 mg by mouth 2 (two) times daily.    [provider]  ?latanoprost (XALATAN) 0.005 % ophthalmic solution Place 1 drop into both eyes at bedtime. ?Patient not taking: Reported on 11/22/2021 11/22/21   Fritzi Mandes, MD  ?Multiple Vitamin (MULTIVITAMIN WITH MINERALS) TABS tablet Take 1 tablet by mouth daily. ?Patient not taking: Reported on 11/22/2021 11/23/21   Fritzi Mandes, MD  ?  ? ?Family History  ?Problem Relation Age of Onset  ? Colon cancer Mother   ? Hypertension Father   ? Rheum arthritis Brother   ? Diabetes Brother   ? Breast cancer  Neg Hx   ? ? ?Social History  ? ?Socioeconomic History  ? Marital status: Divorced  ?  Spouse name: Not on file  ? Number of children: Not on file  ? Years of education: Not on file  ? Highest education level: Not on file  ?Occupational History  ? Not on file  ?Tobacco Use  ? Smoking status: Never  ? Smokeless tobacco: Never  ?Substance and Sexual Activity  ? Alcohol use: No  ? Drug use: No  ? Sexual activity: Not Currently  ?Other Topics Concern  ? Not on file  ?Social History  Narrative  ? Not on file  ? ?Social Determinants of Health  ? ?Financial Resource Strain: Not on file  ?Food Insecurity: Not on file  ?Transportation Needs: Not on file  ?Physical Activity: Not on file  ?Stress: Not on file  ?Social Connections: Not on file  ? ?Review of Systems: A 12 point ROS discussed and pertinent positives are indicated in the HPI above.  All other systems are negative. ? ?Review of Systems ? ?Vital Signs: ?BP (!) 82/65 (BP Location: Right Arm)   Pulse (!) 107   Temp 98.6 ?F (37 ?C)   Resp 20   Ht 4\' 10"  (1.473 m)   Wt 89 lb 3.2 oz (40.5 kg)   SpO2 100%   BMI 18.64 kg/m?  ? ?Physical Exam ?Constitutional:   ?   Appearance: She is ill-appearing.  ?   Comments: frail  ?HENT:  ?   Head: Normocephalic and atraumatic.  ?Cardiovascular:  ?   Rate and Rhythm: Tachycardia present.  ?Pulmonary:  ?   Comments: Increased respiratory effort  ?Skin: ?   General: Skin is warm and dry.  ?Neurological:  ?   Mental Status: She is alert and oriented to person, place, and time.  ? ? ?Imaging: ?DG Chest 2 View ? ?Result Date: 11/20/2021 ?CLINICAL DATA:  Possible sepsis with shortness of breath and cough, initial encounter EXAM: CHEST - 2 VIEW COMPARISON:  None Available. FINDINGS: Cardiac shadow is at the upper limits of normal in size. Lungs are hyperinflated. Right middle lobe pneumonia is seen with associated pleural effusion. No other focal abnormality is seen. IMPRESSION: Right middle lobe pneumonia with associated right-sided effusion. Electronically Signed   By: Inez Catalina M.D.   On: 11/20/2021 19:59  ? ?DG Chest Port 1 View ? ?Result Date: 11/22/2021 ?CLINICAL DATA:  Questionable sepsis - evaluate for abnormality Shortness of breath. EXAM: PORTABLE CHEST 1 VIEW COMPARISON:  Chest radiograph yesterday and 11/20/2021, patient underwent recent thoracentesis. FINDINGS: Right pleural effusion is increased from radiograph yesterday. Associated increased opacity in the right lower hemithorax may be related  to layering pleural effusion or atelectasis/airspace disease. Small left pleural effusion is similar. Stable heart size and mediastinal contours. No pneumothorax. IMPRESSION: 1. Increased right pleural effusion from radiograph yesterday. Associated increased opacity in the right lower hemithorax may be related to layering pleural fluid or atelectasis/airspace disease. 2. Unchanged small left pleural effusion. Electronically Signed   By: Keith Rake M.D.   On: 11/22/2021 18:59  ? ?DG Chest Port 1 View ? ?Result Date: 11/21/2021 ?CLINICAL DATA:  Status post right pleural effusion. EXAM: PORTABLE CHEST 1 VIEW COMPARISON:  Chest x-ray from yesterday. FINDINGS: Stable cardiomediastinal silhouette. Decreased now trace right pleural effusion status post thoracentesis. Somewhat improved aeration at the right lung base with residual patchy consolidation and interstitial opacity in the right middle lobe. New trace left pleural effusion. No pneumothorax. No  acute osseous abnormality. IMPRESSION: 1. Decreased now trace right pleural effusion status post thoracentesis. No pneumothorax. 2. Somewhat improved aeration at the right lung base with residual patchy consolidation and interstitial opacity in the right middle lobe suspicious for pneumonia. Followup PA and lateral chest X-ray is recommended in 3-4 weeks following trial of antibiotic therapy to ensure resolution. 3. New trace left pleural effusion. Electronically Signed   By: Titus Dubin M.D.   On: 11/21/2021 11:28  ? ?US THORACENTESIS ASP PLEURAL SPACE W/IMG GUIDE ? ?Result Date: 11/21/2021 ?INDICATION: Patient admitted with right middle lobe pneumonia and found to have right-sided pleural effusion. Interventional radiology asked to perform a diagnostic and therapeutic thoracentesis. EXAM: ULTRASOUND GUIDED THORACENTESIS MEDICATIONS: 1% lidocaine 10 mL COMPLICATIONS: None immediate. PROCEDURE: An ultrasound guided thoracentesis was thoroughly discussed with the  patient and questions answered. The benefits, risks, alternatives and complications were also discussed. The patient understands and wishes to proceed with the procedure. Written consent was obtained. Ultrasound was perform

## 2021-11-24 NOTE — Evaluation (Signed)
Occupational Therapy Evaluation ?Patient Details ?Name: Carly Abbott ?MRN: [id number] ?DOB: 1956/04/04 ?Today's Date: 11/24/2021 ? ? ?History of Present Illness Pt is a 66 year old female with history of depression with anxiety, multiple sclerosis, vestibular schwannoma, hypertension, history of hyperlipidemia, recent admission for shortness of breath and was found to have right pleural effusion who presents emergency department for chief concerns of shortness of breath. Pt s/p thoracentesis during recent prior admission now re-admitted with MD assessment that includes: Pleural effusion on right, mild back pain, cough, SIRS, and hypoalbuminemia due to protein-calorie malnutrition.  ? ?Clinical Impression ?  ?Carly Abbott was seen for OT evaluation this date. Prior to hospital admission, pt was Independent for mobility and ADLs. Pt lives alone with family/friend support available. Pt presents to acute OT demonstrating impaired ADL performance and functional mobility 2/2 decreased activity tolerance and functional strength/ROM/balance deficits. Pt currently requires CGA + RW for toilet t/f, pericare, and hand washing standing sinkside. MIN cues for clothing mgmt in standing, cues to ID underwear around ankles prior to walking. SPO2 remained in 90s on 5L [place] however increased WOB/wheezing noted. Pt would benefit from skilled OT to address noted impairments and functional limitations (see below for any additional details). Upon hospital discharge, recommend HHOT to maximize pt safety and return to PLOF - pt will require daily assistance from family/friends to assist with IADLs for safe d/c home/   ? ?Recommendations for follow up therapy are one component of a multi-disciplinary discharge planning process, led by the attending physician.  Recommendations may be updated based on patient status, additional functional criteria and insurance authorization.  ? ?Follow Up Recommendations ? Home health OT  ?  ?Assistance Recommended at  Discharge Intermittent Supervision/Assistance  ?Patient can return home with the following A little help with walking and/or transfers;A little help with bathing/dressing/bathroom ? ?  ?Functional Status Assessment ? Patient has had a recent decline in their functional status and demonstrates the ability to make significant improvements in function in a reasonable and predictable amount of time.  ?Equipment Recommendations ? BSC/3in1  ?  ?Recommendations for Other Services   ? ? ?  ?Precautions / Restrictions Precautions ?Precautions: Fall ?Restrictions ?Weight Bearing Restrictions: No  ? ?  ? ?Mobility Bed Mobility ?Overal bed mobility: Modified Independent ?  ?  ?  ?  ?  ?  ?  ?  ? ?Transfers ?Overall transfer level: Needs assistance ?Equipment used: Rolling walker (2 wheels) ?Transfers: Sit to/from Stand ?Sit to Stand: Min guard ?  ?  ?  ?  ?  ?General transfer comment: attempted with no AD (baseline), required MIN A - improved to CGA using RW ?  ? ?  ?Balance Overall balance assessment: Needs assistance ?Sitting-balance support: No upper extremity supported, Feet supported ?Sitting balance-Leahy Scale: Good ?  ?  ?Standing balance support: No upper extremity supported, During functional activity ?Standing balance-Leahy Scale: Fair ?  ?  ?  ?  ?  ?  ?  ?  ?  ?  ?  ?  ?   ? ?ADL either performed or assessed with clinical judgement  ? ?ADL Overall ADL's : Needs assistance/impaired ?  ?  ?  ?  ?  ?  ?  ?  ?  ?  ?  ?  ?  ?  ?  ?  ?  ?  ?  ?General ADL Comments: CGA + RW for toilet t/f, pericare, and hand washing standing sinkside. MIN cues for clothing mgmt in standing,  cues to ID underwear around ankles prior to walking  ? ? ? ? ?Pertinent Vitals/Pain Pain Assessment ?Pain Assessment: No/denies pain ?Pain Score: 0-No pain  ? ? ? ?Hand Dominance   ?  ?Extremity/Trunk Assessment Upper Extremity Assessment ?Upper Extremity Assessment: Overall WFL for tasks assessed ?  ?Lower Extremity Assessment ?Lower Extremity  Assessment: Generalized weakness ?  ?  ?  ?Communication Communication ?Communication: No difficulties ?  ?Cognition Arousal/Alertness: Awake/alert ?Behavior During Therapy: [facility] for tasks assessed/performed ?Overall Cognitive Status: Within Functional Limits for tasks assessed ?  ?  ?  ?  ?  ?  ?  ?  ?  ?  ?  ?  ?  ?  ?  ?  ?  ?  ?  ? ?Home Living Family/patient expects to be discharged to:: Private residence ?Living Arrangements: Alone ?Available Help at Discharge: Friend(s);Available 24 hours/day;Family ?Type of Home: House ?Home Access: Stairs to enter ?Entrance Stairs-Number of Steps: 3 ?Entrance Stairs-Rails: Right ?Home Layout: One level ?  ?  ?Bathroom Shower/Tub: Walk-in shower ?  ?Bathroom Toilet: Standard ?Bathroom Accessibility: Yes ?  ?Home Equipment: Wheelchair - manual ?  ?Additional Comments: Son will be staying with patient at discharge ?  ? ?  ?Prior Functioning/Environment Prior Level of Function : Independent/Modified Independent ?  ?  ?  ?  ?  ?  ?Mobility Comments: Ind amb without an AD, no fall history ?ADLs Comments: Ind with ADLs, groceries delivered ?  ? ?  ?  ?OT Problem List: Decreased strength;Decreased range of motion;Decreased activity tolerance;Impaired balance (sitting and/or standing);Decreased safety awareness;Cardiopulmonary status limiting activity ?  ?   ?OT Treatment/Interventions: Self-care/ADL training;Therapeutic exercise;Energy conservation;DME and/or AE instruction;Therapeutic activities;Patient/family education;Balance training  ?  ?OT Goals(Current goals can be found in the care plan section) Acute Rehab OT Goals ?Patient Stated Goal: to go home ?OT Goal Formulation: With patient/family ?Time For Goal Achievement: 2021-12-14 ?Potential to Achieve Goals: Good ?ADL Goals ?Pt Will Perform Grooming: with modified independence;standing ?Pt Will Perform Lower Body Dressing: with modified independence;sit to/from stand ?Pt Will Transfer to Toilet: with modified  independence;ambulating;regular height toilet  ?OT Frequency: Min 2X/week ?  ? ?Co-evaluation   ?  ?  ?  ?  ? ?  ?AM-PAC OT "6 Clicks" Daily Activity     ?Outcome Measure Help from another person eating meals?: None ?Help from another person taking care of personal grooming?: A Little ?Help from another person toileting, which includes using toliet, bedpan, or urinal?: A Little ?Help from another person bathing (including washing, rinsing, drying)?: A Little ?Help from another person to put on and taking off regular upper body clothing?: None ?Help from another person to put on and taking off regular lower body clothing?: A Little ?6 Click Score: 20 ?  ?End of Session Equipment Utilized During Treatment: Rolling walker (2 wheels) ? ?Activity Tolerance: Patient tolerated treatment well ?Patient left: in chair;with call bell/phone within reach;with chair alarm set;with family/visitor present ? ?OT Visit Diagnosis: Other abnormalities of gait and mobility (R26.89)  ?              ?Time: 6712-4580 ?OT Time Calculation (min): 21 min ?Charges:  OT General Charges ?$OT Visit: 1 Visit ?OT Evaluation ?$OT Eval Moderate Complexity: 1 Mod ?OT Treatments ?$Self Care/Home Management : 8-22 mins ? ?Dessie Coma, M.S. OTR/L  ?11/24/21, 1:21 PM  ?ascom [phone] ? ?

## 2021-11-24 NOTE — Assessment & Plan Note (Addendum)
Likely secondary to malignant process and not eating well. ?Body mass index is 18.64 kg/m?Marland Kitchen ? ?

## 2021-11-25 DIAGNOSIS — J90 Pleural effusion, not elsewhere classified: Secondary | ICD-10-CM | POA: Diagnosis not present

## 2021-11-25 DIAGNOSIS — Z7189 Other specified counseling: Secondary | ICD-10-CM | POA: Diagnosis not present

## 2021-11-25 DIAGNOSIS — R432 Parageusia: Secondary | ICD-10-CM | POA: Diagnosis present

## 2021-11-25 LAB — URINE CULTURE: Culture: NO GROWTH

## 2021-11-25 LAB — BASIC METABOLIC PANEL
Anion gap: 9 (ref 5–15)
BUN: 22 mg/dL (ref 8–23)
CO2: 28 mmol/L (ref 22–32)
Calcium: 8.6 mg/dL — ABNORMAL LOW (ref 8.9–10.3)
Chloride: 99 mmol/L (ref 98–111)
Creatinine, Ser: 0.74 mg/dL (ref 0.44–1.00)
GFR, Estimated: >60 mL/min (ref >60)
Glucose, Bld: 136 mg/dL — ABNORMAL HIGH (ref 70–99)
Potassium: 3.9 mmol/L (ref 3.5–5.1)
Sodium: 136 mmol/L (ref 135–145)

## 2021-11-25 LAB — CULTURE, BLOOD (ROUTINE X 2)
Culture: NO GROWTH
Culture: NO GROWTH
Special Requests: ADEQUATE
Special Requests: ADEQUATE

## 2021-11-25 MED ORDER — ACETAMINOPHEN 500 MG PO TABS
1000.0000 mg | ORAL_TABLET | Freq: Three times a day (TID) | ORAL | Status: DC
  Administered 2021-11-25 – 2021-11-29 (×14): 1000 mg via ORAL
  Filled 2021-11-25 (×14): qty 2

## 2021-11-25 MED ORDER — SENNOSIDES-DOCUSATE SODIUM 8.6-50 MG PO TABS
1.0000 | ORAL_TABLET | Freq: Two times a day (BID) | ORAL | Status: DC
  Administered 2021-11-25 – 2021-11-29 (×10): 1 via ORAL
  Filled 2021-11-25 (×10): qty 1

## 2021-11-25 MED ORDER — MORPHINE SULFATE (PF) 2 MG/ML IV SOLN
1.0000 mg | INTRAVENOUS | Status: DC | PRN
  Administered 2021-11-25 – 2021-11-30 (×17): 2 mg via INTRAVENOUS
  Filled 2021-11-25 (×18): qty 1

## 2021-11-25 MED ORDER — POLYETHYLENE GLYCOL 3350 17 G PO PACK
17.0000 g | PACK | Freq: Every day | ORAL | Status: DC
  Administered 2021-11-25 – 2021-11-29 (×5): 17 g via ORAL
  Filled 2021-11-25 (×5): qty 1

## 2021-11-25 MED ORDER — METHOCARBAMOL 1000 MG/10ML IJ SOLN
500.0000 mg | Freq: Three times a day (TID) | INTRAVENOUS | Status: DC
  Administered 2021-11-25 – 2021-11-26 (×3): 500 mg via INTRAVENOUS
  Filled 2021-11-25 (×2): qty 5
  Filled 2021-11-25: qty 500
  Filled 2021-11-25: qty 5

## 2021-11-25 MED ORDER — ACETAMINOPHEN 650 MG RE SUPP: 650.0000 mg | Freq: Three times a day (TID) | RECTAL | Status: DC

## 2021-11-25 NOTE — Progress Notes (Signed)
?Progress Note ? ? ?Patient: Carly Abbott YTW:[id number] DOB: 1956/02/10 DOA: 11/22/2021     2 ?DOS: the patient was seen and examined on 11/25/2021 ?  ?Brief hospital course: ?Ms. 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  She was reportedly too short of breath to make it into her home, and returned to the ED. ? ?Her O2 requirement increased from 2 >> 4 L/min, later up to 8 L/min, with tachypnea and mild tachycardia. ?Chest xray showed some re-accumulation of the R pleural effusion since the thoracentesis on 5/5. ? ?Admitted to hospitalist service.  IR consulted for placement of PleurX pigtail catheter, as patient prefers not to have to undergo frequent serial paracenteses. ? ? ?5/7: requiring low dose morphine to control dyspnea.  IR plans for drain placement tomorrow.  ? ?Assessment and Plan: ?* Pleural effusion on right ?Patient underwent thoracentesis on 5/5 with 1.2 L fluid removed.  Preliminary fluid studies reflect malignancy. ?--IR consulted for Pleurx drainage catheter, placed 5/8 ?-- Oncology outpatient follow-up ?-- Palliative care consulted ?--Trial Robaxin for increased spasms after drain placed (also on Flexeril, could try increase but pt felt it doesn't help much) ? ?Taste sense altered ?Pt reported recent history of this on admission. ?--Zinc level pending ?--Dietitian following ? ?Protein-calorie malnutrition, severe ?related to chronic illness (MS) as evidenced by energy intake < or equal to 75% for > or equal to 1 month, severe fat depletion, severe muscle  depletion. ?--Dietitian following ?--Ensures, MVI ?--Follow up zinc level ? ? ?Weight loss, unintentional ?Pt reports recent wt loss, suspect due to underlying malignancy and reduced PO intake as pt states nothing tastes normal recently. ?Body mass index is 18.64 kg/m?. ?--Dietician consulted ? ?Hypokalemia ?K 3.4 on 5/9, replaced. ?Monitor and replace K PRN ? ?Hypotension ?BP in the 90s over 60s today. ?-- Hold amlodipine ?-- Started low-dose midodrine ?-- Maintain MAP above 65 ? ?Mid back pain ?Lidocaine patch ? ?Cough ?In setting of pleural effusion and ongoing dyspnea.  Patient reported sensation of phlegm, with difficulty expectorating, pain limits strong cough. ?- Scheduled Mucinex twice daily ?- Incentive spirometry and flutter valve  ? ?SIRS (systemic inflammatory response syndrome) (HCC) ?On admission, patient met SIRS criteria with tachypnea and tachycardia.  No evidence for active infection  ?-- Hold off antibiotics ?- Follow blood cultures ?-- Monitor for signs or symptoms of infection ? ?Hypoalbuminemia due to protein-calorie malnutrition (Carly Abbott) ?Dietitian consulted ? ?S/P thoracentesis ?Patient is status post right-sided ultrasound-guided thoracentesis yielding 1.2 L of clear yellow fluid on 11/21/2021 with ?preliminary fluid studies positive for malignant cells  ? ?--outpatient follow-up with medical oncology ? ?Anxiety and depression ?--Continue home duloxetine ?--Ativan 0.5 mg IV qhs PRN ? ?Hyperlipidemia ?Appears not on medication ? ?Essential hypertension ?BP soft in the 90s over 60s, lower at times. ?-- Hold home amlodipine ?--Started low-dose midodrine ?--Monitor BP, maintain MAP above 65 ? ? ?MS (multiple sclerosis) ([place]) ?Patient reports muscle spasms causes focal to the sleeping at times  ?-- Continue home Flexeril 10 mg p.o. twice daily ? ? ? ? ?  ? ?Subjective: Patient writhing in pain, sitting up  in bed when seen on rounds this morning.  She is having severe spasm-like pains in her side rib  cage since the drain was placed yesterday.  Ongoing shortness of breath worsened by the pain, unable to produce a good cough also. ? ?Physical Exam: ?Vitals:  ? 11/25/21 0025 11/25/21 0442 11/25/21 1203 11/25/21 1701  ?BP: 97/69 95/61 (!) 99/57 102/62  ?Pulse: 100 94 94 91  ?Resp:  20 16 14   ?Temp:  98 ?F (36.7 ?C) 98 ?F (36.7 ?C) (!) 97.4 ?F (36.3 ?C)  ?TempSrc:   Oral Oral  ?SpO2: 97% 98% 93% 97%  ?Weight:      ?Height:      ? ?General exam: awake, alert, in significant distress due to pain, cachectic ?HEENT: Wearing glasses, moist mucus membranes, hearing grossly normal  ?Respiratory system: Pleurx drain in right lower chest, tachypneic, on 6 L/min [place] O2, diminished right base, otherwise upper airway secretion sounds. ?Cardiovascular system: normal S1/S2, RRR, no pedal edema.   ?Gastrointestinal system: soft, NT, ND, no HSM felt, +bowel sounds. ?Central nervous system: A&O x3. no gross focal neurologic deficits, normal speech ?Extremities: moves all, no edema, normal tone ?Skin: dry, intact, normal temperature ?Psychiatry: Anxious mood, congruent affect, judgement and insight appear normal ? ? ?Data Reviewed: ? ?Notable labs: Glucose 136, calcium 8.6 ? ?Family Communication: Brother was at bedside on rounds yesterday 5/8 ? ?Disposition: ?Status is: Inpatient ?Remains inpatient appropriate because: Pain uncontrolled, needs Pleurx drain education prior to discharge ? ? ? Planned Discharge Destination: Home with Home Health ? ? ? ?Time spent: 35 minutes ? ?Author: ?Ezekiel Slocumb, DO ?11/25/2021 5:39 PM ? ?For on call review [email or web address].  ?

## 2021-11-25 NOTE — Progress Notes (Signed)
Physical Therapy Treatment ?Patient Details ?Name: Carly Abbott ?MRN: [id number] ?DOB: Jun 23, 1956 ?Today's Date: 11/25/2021 ? ? ?History of Present Illness Pt is a 66 year old female with history of depression with anxiety, multiple sclerosis, vestibular schwannoma, hypertension, history of hyperlipidemia, recent admission for shortness of breath and was found to have right pleural effusion who presents emergency department for chief concerns of shortness of breath. Pt s/p thoracentesis during recent prior admission now re-admitted with MD assessment that includes: Pleural effusion on right, mild back pain, cough, SIRS, and hypoalbuminemia due to protein-calorie malnutrition. ? ?  ?PT Comments  ? ? Pt presents alert and orient supine in bed with brother present. Pt exhibits improvement in ambulation with ability to ambulate an increased distance from last session. She also exhibits improved functional mobility by negotiating 4 stairs. After initial increase in 02 from 4L to 6L because of 02 desat from 93 to 88, pt maintained consisted 02 saturation with activity at 93%. She did experience a loss of balance while ambulating in stair well and she was able to regain balance with UE support against wall along with CGA from therapist. Session limited by pt fatigue and she will definitely require additional HHPT to improve aerobic capacity and activity tolerance to return to prior level of function and remain independent. PT confirmed that brother will be present to provide additional support to maintain pt safety.    ?Recommendations for follow up therapy are one component of a multi-disciplinary discharge planning process, led by the attending physician.  Recommendations may be updated based on patient status, additional functional criteria and insurance authorization. ? ?Follow Up Recommendations ? Home health PT ?  ?  ?Assistance Recommended at Discharge Frequent or constant Supervision/Assistance  ?Patient can return  home with the following A little help with walking and/or transfers;A little help with bathing/dressing/bathroom;Assistance with cooking/housework;Assist for transportation;Help with stairs or ramp for entrance ?  ?Equipment Recommendations ? Rolling walker (2 wheels)  ?  ?Recommendations for Other Services   ? ? ?  ?Precautions / Restrictions Precautions ?Precautions: Fall ?Restrictions ?Weight Bearing Restrictions: No  ?  ? ?Mobility ? Bed Mobility ?Overal bed mobility: Modified Independent ?  ?  ?  ?  ?  ?  ?General bed mobility comments: in chair before and after ?  ? ?Transfers ?Overall transfer level: Needs assistance ?Equipment used: Rolling walker (2 wheels) ?Transfers: Sit to/from Stand ?Sit to Stand: Min guard ?  ?  ?  ?  ?  ?General transfer comment: CGA with RWW ?  ? ?Ambulation/Gait ?Ambulation/Gait assistance: Min guard, Min assist (Pt stumbled during ambulation and caught her self on side of wall and with CGA able to regain balance) ?Gait Distance (Feet): 60 Feet ?Assistive device: Rolling walker (2 wheels) ?Gait Pattern/deviations: Decreased dorsiflexion - left, Step-through pattern, Trunk flexed, Decreased stride length ?Gait velocity: decreased ?  ?  ?General Gait Details: Slow cadence and increased forward flexion during ambulation ? ? ?Stairs ?Stairs: Yes ?Stairs assistance: Min guard ?Stair Management: One rail Left, Sideways ?Number of Stairs: 4 ?General stair comments: BUE support on left railing with increased time to negotiate. ? ? ?Wheelchair Mobility ?  ? ?Modified Rankin (Stroke Patients Only) ?  ? ? ?  ?Balance Overall balance assessment: Needs assistance ?Sitting-balance support: No upper extremity supported, Feet supported ?Sitting balance-Leahy Scale: Good ?  ?  ?Standing balance support: Bilateral upper extremity supported, During functional activity, Reliant on assistive device for balance ?Standing balance-Leahy Scale: Good ?  ?  ?  ?  ?  ?  ?  ?  ?  ?  ?  ?  ?  ? ?  ?  Cognition  Arousal/Alertness: Awake/alert ?Behavior During Therapy: [facility] for tasks assessed/performed ?Overall Cognitive Status: Within Functional Limits for tasks assessed ?  ?  ?  ?  ?  ?  ?  ?  ?  ?  ?  ?  ?  ?  ?  ?  ?  ?  ?  ? ?  ?Exercises   ? ?  ?General Comments General comments (skin integrity, edema, etc.): Titrated O2 up from 4L to 6L because of initial desat from 93 to 88% SpO2 ?  ?  ? ?Pertinent Vitals/Pain Pain Assessment ?Pain Assessment: No/denies pain  ? ? ?Home Living   ?  ?  ?  ?  ?  ?  ?  ?  ?  ?   ?  ?Prior Function    ?  ?  ?   ? ?PT Goals (current goals can now be found in the care plan section) Acute Rehab PT Goals ?Patient Stated Goal: To get stronger and return home ?PT Goal Formulation: With patient ?Time For Goal Achievement: 12/06/21 ?Potential to Achieve Goals: Good ?Progress towards PT goals: Progressing toward goals ? ?  ?Frequency ? ? ? Min 2X/week ? ? ? ?  ?PT Plan Current plan remains appropriate  ? ? ?Co-evaluation   ?  ?  ?  ?  ? ?  ?AM-PAC PT "6 Clicks" Mobility   ?Outcome Measure ? Help needed turning from your back to your side while in a flat bed without using bedrails?: None ?Help needed moving from lying on your back to sitting on the side of a flat bed without using bedrails?: None ?Help needed moving to and from a bed to a chair (including a wheelchair)?: A Little ?Help needed standing up from a chair using your arms (e.g., wheelchair or bedside chair)?: A Little ?Help needed to walk in hospital room?: A Little ?Help needed climbing 3-5 steps with a railing? : A Little ?6 Click Score: 20 ? ?  ?End of Session Equipment Utilized During Treatment: Gait belt;Oxygen ?Activity Tolerance: Patient tolerated treatment well;Patient limited by fatigue ?Patient left: in chair;with call bell/phone within reach;with chair alarm set ?Nurse Communication: Mobility status;Other (comment) (SpO2 readings) ?PT Visit Diagnosis: Unsteadiness on feet (R26.81);Muscle weakness (generalized)  (M62.81);Difficulty in walking, not elsewhere classified (R26.2) ?  ? ? ?Time: 6837-2902 ?PT Time Calculation (min) (ACUTE ONLY): 22 min ? ?Charges:  $Gait Training: 8-22 mins          ?          ? ?Bradly Chris PT, DPT  ?11/25/2021, 6:11 PM ? ?

## 2021-11-25 NOTE — Assessment & Plan Note (Deleted)
Pt reported recent history of this on admission. ?--Zinc level pending ?--Dietitian following ?

## 2021-11-25 NOTE — Consult Note (Addendum)
Consultation Note Date: 11/25/2021   Patient Name: Carly Abbott  DOB: 1955-12-05  MRN: [id number]  Age / Sex: 66 y.o., female  PCP: Dion Body, MD Referring Physician: Ezekiel Slocumb, DO  Reason for Consultation: Establishing goals of care  HPI/Patient Profile: Ms. Carly Abbott is a 66 year old female with history of depression with anxiety, multiple sclerosis, vestibular schwannoma, hypertension, history of hyperlipidemia, who presents emergency department for chief concerns of cough and shortness of breath.  Clinical Assessment and Goals of Care: Notes and labs reviewed. Patient is sitting in bed with brother at bedside. She is divorced and does not have children. Her brother is her only sibling and she states he would likely be the person she would want to be his surrogate decision maker.   Patient has right lung pain which she states she has had for around a month. She has had SOB for around a month as well. She has a pleurex catheter in place, and is happy for this given her fluid reaccumulation. She understands chances of infection with the pleurex. She tells me at baseline, she orders her groceries online, but is independent in the house. Patient shares that she values her privacy and independence.    She discusses that she is a retired Marine scientist, and used to work as a Merchandiser, retail. She points to her DNR bracelet and tells me she is a DNR. She confirms DNI status and that she would not want a feeding tube. She would like to treat the treatable.   Discussed goals of care. She tells me she has spoken with oncology and has remained updated. She tells me she needs information to make a decision on oncologic treatment vs hospice care. Discussed the "what if's". She states she would like to know what type of cancer she has, and if it has metastasized. She voices frustration at not being able to do diagnostics  and staging to find that information out while she is admitted.  Nursing in with pain medication.     SUMMARY OF RECOMMENDATIONS   Recommend palliative to follow outpatient.  Recommend patient to see Billey Chang NP in the cancer center for palliative medicine if she chooses follow up at the cancer center.        Primary Diagnoses: Present on Admission:  Pleural effusion on right  Anxiety and depression  Essential hypertension  Hyperlipidemia  MS (multiple sclerosis) (HCC)  Hypoalbuminemia due to protein-calorie malnutrition (HCC)  SIRS (systemic inflammatory response syndrome) (HCC)  Cough  Hypotension  Weight loss, unintentional   I have reviewed the medical record, interviewed the patient and family, and examined the patient. The following aspects are pertinent.  Past Medical History:  Diagnosis Date   Depression    Glaucoma    Headache    Hyperlipidemia    Multiple sclerosis ([place])    Spinal stenosis    Social History   Socioeconomic History   Marital status: Divorced    Spouse name: Not on file   Number of children: Not on  file   Years of education: Not on file   Highest education level: Not on file  Occupational History   Not on file  Tobacco Use   Smoking status: Never   Smokeless tobacco: Never  Substance and Sexual Activity   Alcohol use: No   Drug use: No   Sexual activity: Not Currently  Other Topics Concern   Not on file  Social History Narrative   Not on file   Social Determinants of Health   Financial Resource Strain: Not on file  Food Insecurity: Not on file  Transportation Needs: Not on file  Physical Activity: Not on file  Stress: Not on file  Social Connections: Not on file   Family History  Problem Relation Age of Onset   Colon cancer Mother    Hypertension Father    Rheum arthritis Brother    Diabetes Brother    Breast cancer Neg Hx    Scheduled Meds:  acetaminophen  1,000 mg Oral Q8H   Or   acetaminophen  650 mg Rectal  Q8H   cyclobenzaprine  10 mg Oral BID   DULoxetine  30 mg Oral Daily   feeding supplement  237 mL Oral TID BM   guaiFENesin  600 mg Oral BID   latanoprost  1 drop Both Eyes QHS   lidocaine  1 patch Transdermal Q24H   midodrine  5 mg Oral BID WC   multivitamin with minerals  1 tablet Oral Daily   oxybutynin  5 mg Oral Daily   polyethylene glycol  17 g Oral Daily   senna-docusate  1 tablet Oral BID   traMADol  50-100 mg Oral Daily   Continuous Infusions:  methocarbamol (ROBAXIN) IV 500 mg (11/25/21 1028)   PRN Meds:.lidocaine, LORazepam, morphine injection, ondansetron **OR** ondansetron (ZOFRAN) IV Medications Prior to Admission:  Prior to Admission medications   Medication Sig Start Date End Date Taking? Authorizing Provider  amLODipine (NORVASC) 5 MG tablet Take 1 tablet (5 mg total) by mouth daily. 09/30/21 12/29/21 Yes Kris Hartmann, NP  DULoxetine (CYMBALTA) 30 MG capsule Take 30 mg by mouth daily. 09/19/21  Yes [provider]  oxybutynin (DITROPAN) 5 MG tablet Take 5 mg by mouth daily. 10/07/21  Yes [provider]  traMADol (ULTRAM) 50 MG tablet Take 50-100 mg by mouth daily. 11/04/21  Yes [provider]  acetaminophen (TYLENOL) 500 MG tablet Take 500 mg by mouth every 6 (six) hours as needed.    [provider]  cyclobenzaprine (FLEXERIL) 10 MG tablet Take 10 mg by mouth 2 (two) times daily.    [provider]  latanoprost (XALATAN) 0.005 % ophthalmic solution Place 1 drop into both eyes at bedtime. Patient not taking: Reported on 11/22/2021 11/22/21   Fritzi Mandes, MD  Multiple Vitamin (MULTIVITAMIN WITH MINERALS) TABS tablet Take 1 tablet by mouth daily. Patient not taking: Reported on 11/22/2021 11/23/21   Fritzi Mandes, MD   Allergies  Allergen Reactions   Cardizem [Diltiazem Hcl] Itching   Other Hives    TECFREDA    Review of Systems  Respiratory:         Right lung pain.    Physical Exam Pulmonary:     Effort: Pulmonary effort  is normal.  Neurological:     Mental Status: She is alert.    Vital Signs: BP (!) 99/57 (BP Location: Right Arm)   Pulse 94   Temp 98 F (36.7 C) (Oral)   Resp 16   Ht 4'  10" (1.473 m)   Wt 40.5 kg   SpO2 93%   BMI 18.64 kg/m  Pain Scale: 0-10   Pain Score: 9    SpO2: SpO2: 93 % O2 Device:SpO2: 93 % O2 Flow Rate: .O2 Flow Rate (L/min): 6 L/min  IO: Intake/output summary: No intake or output data in the 24 hours ending 11/25/21 1339  LBM: Last BM Date : 11/21/21 Baseline Weight: Weight: 38.6 kg Most recent weight: Weight: 40.5 kg      Signed by: Asencion Gowda, NP   Please contact Palliative Medicine Team phone at [phone] for questions and concerns.  For individual provider: See Shea Evans

## 2021-11-25 NOTE — Assessment & Plan Note (Addendum)
related to chronic illness  ?

## 2021-11-26 ENCOUNTER — Inpatient Hospital Stay: Payer: Medicare Other

## 2021-11-26 DIAGNOSIS — R634 Abnormal weight loss: Secondary | ICD-10-CM

## 2021-11-26 DIAGNOSIS — J9601 Acute respiratory failure with hypoxia: Secondary | ICD-10-CM | POA: Diagnosis not present

## 2021-11-26 DIAGNOSIS — E43 Unspecified severe protein-calorie malnutrition: Secondary | ICD-10-CM

## 2021-11-26 DIAGNOSIS — E8809 Other disorders of plasma-protein metabolism, not elsewhere classified: Secondary | ICD-10-CM | POA: Diagnosis not present

## 2021-11-26 DIAGNOSIS — J90 Pleural effusion, not elsewhere classified: Secondary | ICD-10-CM | POA: Diagnosis not present

## 2021-11-26 DIAGNOSIS — C799 Secondary malignant neoplasm of unspecified site: Secondary | ICD-10-CM | POA: Diagnosis not present

## 2021-11-26 DIAGNOSIS — E876 Hypokalemia: Secondary | ICD-10-CM

## 2021-11-26 DIAGNOSIS — J9621 Acute and chronic respiratory failure with hypoxia: Secondary | ICD-10-CM

## 2021-11-26 DIAGNOSIS — C7800 Secondary malignant neoplasm of unspecified lung: Secondary | ICD-10-CM

## 2021-11-26 DIAGNOSIS — E46 Unspecified protein-calorie malnutrition: Secondary | ICD-10-CM

## 2021-11-26 DIAGNOSIS — I959 Hypotension, unspecified: Secondary | ICD-10-CM

## 2021-11-26 LAB — CBC
HCT: 40.7 % (ref 36.0–46.0)
Hemoglobin: 13.1 g/dL (ref 12.0–15.0)
MCH: 29.2 pg (ref 26.0–34.0)
MCHC: 32.2 g/dL (ref 30.0–36.0)
MCV: 90.6 fL (ref 80.0–100.0)
Platelets: 515 10*3/uL — ABNORMAL HIGH (ref 150–400)
RBC: 4.49 MIL/uL (ref 3.87–5.11)
RDW: 12.7 % (ref 11.5–15.5)
WBC: 10.1 10*3/uL (ref 4.0–10.5)
nRBC: 0 % (ref 0.0–0.2)

## 2021-11-26 LAB — CYTOLOGY - NON PAP

## 2021-11-26 MED ORDER — SERTRALINE HCL 50 MG PO TABS
50.0000 mg | ORAL_TABLET | Freq: Every day | ORAL | Status: DC
  Administered 2021-11-26 – 2021-11-29 (×4): 50 mg via ORAL
  Filled 2021-11-26 (×4): qty 1

## 2021-11-26 MED ORDER — PREDNISONE 20 MG PO TABS
40.0000 mg | ORAL_TABLET | Freq: Every day | ORAL | Status: DC
  Administered 2021-11-27 – 2021-11-29 (×3): 40 mg via ORAL
  Filled 2021-11-26 (×3): qty 2

## 2021-11-26 MED ORDER — ENSURE MAX PROTEIN PO LIQD
11.0000 [oz_av] | Freq: Two times a day (BID) | ORAL | Status: DC
  Administered 2021-11-27 – 2021-11-29 (×4): 11 [oz_av] via ORAL

## 2021-11-26 MED ORDER — PREDNISONE 20 MG PO TABS
40.0000 mg | ORAL_TABLET | Freq: Every day | ORAL | Status: DC
  Administered 2021-11-26: 40 mg via ORAL
  Filled 2021-11-26: qty 2

## 2021-11-26 MED ORDER — FUROSEMIDE 10 MG/ML IJ SOLN
20.0000 mg | Freq: Once | INTRAMUSCULAR | Status: AC
  Administered 2021-11-26: 20 mg via INTRAVENOUS
  Filled 2021-11-26: qty 2

## 2021-11-26 MED ORDER — OXYCODONE HCL 5 MG PO TABS
10.0000 mg | ORAL_TABLET | ORAL | Status: DC | PRN
  Administered 2021-11-26 – 2021-11-29 (×5): 10 mg via ORAL
  Filled 2021-11-26 (×5): qty 2

## 2021-11-26 MED ORDER — DIAZEPAM 5 MG PO TABS
10.0000 mg | ORAL_TABLET | Freq: Three times a day (TID) | ORAL | Status: DC | PRN
  Administered 2021-11-26 – 2021-11-29 (×6): 10 mg via ORAL
  Filled 2021-11-26 (×6): qty 2

## 2021-11-26 MED ORDER — OXYCODONE HCL 5 MG PO TABS
5.0000 mg | ORAL_TABLET | ORAL | Status: DC | PRN
  Administered 2021-11-26: 5 mg via ORAL
  Filled 2021-11-26: qty 1

## 2021-11-26 MED ORDER — IOHEXOL 350 MG/ML SOLN
75.0000 mL | Freq: Once | INTRAVENOUS | Status: AC | PRN
  Administered 2021-11-26: 75 mL via INTRAVENOUS

## 2021-11-26 MED ORDER — METHOCARBAMOL 1000 MG/10ML IJ SOLN
500.0000 mg | Freq: Three times a day (TID) | INTRAVENOUS | Status: DC
  Filled 2021-11-26: qty 5

## 2021-11-26 MED ORDER — METHOCARBAMOL 500 MG PO TABS
500.0000 mg | ORAL_TABLET | Freq: Three times a day (TID) | ORAL | Status: DC
  Administered 2021-11-26 – 2021-11-29 (×8): 500 mg via ORAL
  Filled 2021-11-26 (×12): qty 1

## 2021-11-26 MED ORDER — FUROSEMIDE 10 MG/ML IJ SOLN: 40.0000 mg | Freq: Once | INTRAMUSCULAR | Status: DC

## 2021-11-26 NOTE — Assessment & Plan Note (Addendum)
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  CT scan of the abdomen did show a mass in the lung. ?

## 2021-11-26 NOTE — Assessment & Plan Note (Addendum)
This morning patient on 4 L of oxygen.  On admission on 6 L had a pulse ox of 90%.  The patient was hypoxic on room air and is a candidate for home oxygen.  She held her sats walking around the hallway on 4 L today.  Patient qualifies for oxygen at home at this point. ?

## 2021-11-26 NOTE — Consult Note (Addendum)
? ?Hematology/Oncology Consult note ?[phone] [phone] ? ?  ? ? ?Patient Care Team: ?Dion Body, MD as PCP - General (Family Medicine) ?Telford Nab, RN as Sales executive  ? ?Name of the patient: Carly Abbott  ?[id number]  ?[birth year]  ? ?Date of visit: 11/26/21 ?REASON FOR COSULTATION:  ?Malignant pleural effusion ?History of presenting illness-  ?66 y.o. female with PMH listed at below who was readmitted due to shortness of breath and reaccumulation of pleural effusion.   ?Patient was seen by me during her last admission. ?11/21/2021, pleural effusion cytology showed positive for malignancy, metastatic adenocarcinoma, features consistent with lung primary. ?Hematology oncology was consulted for evaluation. ?11/24/2021, patient is status post Pleurx placement on the right side. ?Patient reports some relief of shortness of breath post procedure however still has high oxygen requirement, 5 L nasal cannula oxygen.  Shortness of breath is worse with any movements. ?She also reports that the pain is not very controlled. ?Primary team provider Dr. Leslye Peer has added oxycodone 5 mg every 4 hours as needed this morning and this afternoon has changed to oxycodone 10 mg every 4 hours as needed. ?Patient last oxycodone was 5 mg at 3:00, it has decreased her pain level from 8-3.  Brother and a female friend were at the bedside. ? ?Appetite is poor.  Nothing tastes well for her.  She is taking nutrition supplements.  No fever, chills. ? ? ?Review of Systems  ?Constitutional:  Positive for appetite change, fatigue and unexpected weight change. Negative for chills and fever.  ?HENT:   Negative for hearing loss and voice change.   ?Eyes:  Negative for eye problems.  ?Respiratory:  Positive for cough and shortness of breath. Negative for chest tightness.   ?Cardiovascular:  Negative for chest pain.  ?Gastrointestinal:  Negative for abdominal distention, abdominal pain and blood in stool.   ?Endocrine: Negative for hot flashes.  ?Genitourinary:  Negative for difficulty urinating and frequency.   ?Musculoskeletal:  Negative for arthralgias.  ?     Chronic foot drop due to MS  ?Skin:  Negative for itching and rash.  ?Neurological:  Negative for extremity weakness.  ?Hematological:  Negative for adenopathy.  ?Psychiatric/Behavioral:  Negative for confusion.   ? ?Allergies  ?Allergen Reactions  ? Cardizem [Diltiazem Hcl] Itching  ? Other Hives  ?  TECFREDA   ? ? ?Patient Active Problem List  ? Diagnosis Date Noted  ? Metastatic adenocarcinoma ([place]) 11/26/2021  ? Acute respiratory failure with hypoxia ([place]) 11/26/2021  ? Weight loss, unintentional 11/24/2021  ? Protein-calorie malnutrition, severe 11/24/2021  ? Hypotension 11/23/2021  ? Hypokalemia 11/23/2021  ? Pleural effusion 11/23/2021  ? SIRS (systemic inflammatory response syndrome) ([place]) 11/22/2021  ? Mid back pain 11/22/2021  ? Hypoalbuminemia due to protein-calorie malnutrition ([place])   ? Community acquired pneumonia 11/20/2021  ? Hyperlipidemia 11/20/2021  ? Anxiety and depression 11/20/2021  ? Pleural effusion on right 11/20/2021  ? MS (multiple sclerosis) ([place]) 09/30/2021  ? ? ? ?Past Medical History:  ?Diagnosis Date  ? Depression   ? Glaucoma   ? Headache   ? Hyperlipidemia   ? Multiple sclerosis ([place])   ? Spinal stenosis   ? ? ? ?Past Surgical History:  ?Procedure Laterality Date  ? BACK SURGERY    ? CARPAL TUNNEL RELEASE    ? CHOLECYSTECTOMY    ? COLONOSCOPY    ? COLONOSCOPY WITH PROPOFOL N/A 05/18/2016  ? Procedure: COLONOSCOPY WITH PROPOFOL;  Surgeon: Manya Silvas, MD;  Location: [facility] ENDOSCOPY;  Service: Endoscopy;  Laterality: N/A;  ? IR PERC PLEURAL DRAIN W/INDWELL CATH W/IMG GUIDE  11/24/2021  ? LEG ANGIOGRAPHY    ? TONSILLECTOMY    ? ? ?Social History  ? ?Socioeconomic History  ? Marital status: Divorced  ?  Spouse name: Not on file  ? Number of children: Not on file  ? Years of education: Not on file  ? Highest education level: Not  on file  ?Occupational History  ? Not on file  ?Tobacco Use  ? Smoking status: Never  ? Smokeless tobacco: Never  ?Substance and Sexual Activity  ? Alcohol use: No  ? Drug use: No  ? Sexual activity: Not Currently  ?Other Topics Concern  ? Not on file  ?Social History Narrative  ? Not on file  ? ?Social Determinants of Health  ? ?Financial Resource Strain: Not on file  ?Food Insecurity: Not on file  ?Transportation Needs: Not on file  ?Physical Activity: Not on file  ?Stress: Not on file  ?Social Connections: Not on file  ?Intimate Partner Violence: Not on file  ? ?  ?Family History  ?Problem Relation Age of Onset  ? Colon cancer Mother   ? Hypertension Father   ? Rheum arthritis Brother   ? Diabetes Brother   ? Breast cancer Neg Hx   ? ? ? ?Current Facility-Administered Medications:  ?  acetaminophen (TYLENOL) tablet 1,000 mg, 1,000 mg, Oral, Q8H, 1,000 mg at 11/26/21 1331 **OR** acetaminophen (TYLENOL) suppository 650 mg, 650 mg, Rectal, Q8H, Griffith, Kelly A, DO ?  cyclobenzaprine (FLEXERIL) tablet 10 mg, 10 mg, Oral, BID, Cox, Amy N, DO, 10 mg at 11/26/21 0845 ?  diazepam (VALIUM) tablet 10 mg, 10 mg, Oral, Q8H PRN, Wieting, Richard, MD ?  feeding supplement (ENSURE ENLIVE / ENSURE PLUS) liquid 237 mL, 237 mL, Oral, TID BM, Nicole Kindred A, DO, 237 mL at 11/26/21 6967 ?  guaiFENesin (MUCINEX) 12 hr tablet 600 mg, 600 mg, Oral, BID, Cox, Amy N, DO, 600 mg at 11/26/21 0845 ?  latanoprost (XALATAN) 0.005 % ophthalmic solution 1 drop, 1 drop, Both Eyes, QHS, Cox, Amy N, DO, 1 drop at 11/25/21 2117 ?  lidocaine (LIDODERM) 5 % 1 patch, 1 patch, Transdermal, Q24H, Cox, Amy N, DO, 1 patch at 11/25/21 2015 ?  lidocaine (XYLOCAINE) 1 % (with pres) injection, , , PRN, El-Abd, Joesph Fillers, MD, 15 mL at 11/24/21 1705 ?  methocarbamol (ROBAXIN) tablet 500 mg, 500 mg, Oral, Q8H, 500 mg at 11/26/21 0936 **OR** methocarbamol (ROBAXIN) 500 mg in dextrose 5 % 50 mL IVPB, 500 mg, Intravenous, Q8H, Darrick Penna, RPH ?   midodrine (PROAMATINE) tablet 5 mg, 5 mg, Oral, BID WC, Nicole Kindred A, DO, 5 mg at 11/26/21 0845 ?  morphine (PF) 2 MG/ML injection 1-2 mg, 1-2 mg, Intravenous, Q3H PRN, Nicole Kindred A, DO, 2 mg at 11/26/21 1331 ?  multivitamin with minerals tablet 1 tablet, 1 tablet, Oral, Daily, Cox, Amy N, DO, 1 tablet at 11/26/21 0845 ?  ondansetron (ZOFRAN) tablet 4 mg, 4 mg, Oral, Q6H PRN **OR** ondansetron (ZOFRAN) injection 4 mg, 4 mg, Intravenous, Q6H PRN, Cox, Amy N, DO, 4 mg at 11/25/21 0214 ?  oxybutynin (DITROPAN) tablet 5 mg, 5 mg, Oral, Daily, Cox, Amy N, DO, 5 mg at 11/26/21 0845 ?  oxyCODONE (Oxy IR/ROXICODONE) immediate release tablet 10 mg, 10 mg, Oral, Q4H PRN, Leslye Peer, Richard, MD ?  polyethylene glycol (MIRALAX / GLYCOLAX) packet 17 g, 17  g, Oral, Daily, Nicole Kindred A, DO, 17 g at 11/26/21 0845 ?  senna-docusate (Senokot-S) tablet 1 tablet, 1 tablet, Oral, BID, Ezekiel Slocumb, DO, 1 tablet at 11/26/21 4010 ?  sertraline (ZOLOFT) tablet 50 mg, 50 mg, Oral, Daily, Wieting, Richard, MD ?  traMADol Veatrice Bourbon) tablet 50-100 mg, 50-100 mg, Oral, Daily, Cox, Amy N, DO, 50 mg at 11/26/21 0845 ? ? ?Physical exam:  ?Vitals:  ? 11/26/21 0447 11/26/21 0805 11/26/21 1143 11/26/21 1558  ?BP: (!) 93/55 94/72 111/68 119/61  ?Pulse: 81 95 94 100  ?Resp: 20 18 18 16   ?Temp: 98.1 ?F (36.7 ?C) 97.7 ?F (36.5 ?C) 98.2 ?F (36.8 ?C) 98.2 ?F (36.8 ?C)  ?TempSrc: Oral   Oral  ?SpO2: 96% 94% 98% 98%  ?Weight:      ?Height:      ? ?Physical Exam ?Constitutional:   ?   General: She is not in acute distress. ?   Appearance: She is not diaphoretic.  ?   Comments: thin  ?HENT:  ?   Head: Normocephalic and atraumatic.  ?   Nose: Nose normal.  ?   Mouth/Throat:  ?   Pharynx: No oropharyngeal exudate.  ?Eyes:  ?   General: No scleral icterus. ?   Pupils: Pupils are equal, round, and reactive to light.  ?Cardiovascular:  ?   Rate and Rhythm: Normal rate and regular rhythm.  ?   Heart sounds: No murmur heard. ?Pulmonary:  ?   Effort:  Pulmonary effort is normal. No respiratory distress.  ?   Comments: Decreased breath sounds right lower lobe ?Abdominal:  ?   General: There is no distension.  ?   Palpations: Abdomen is soft.  ?   Tenderness: There

## 2021-11-26 NOTE — TOC Progression Note (Signed)
Transition of Care (TOC) - Progression Note  ? ? ?Patient Details  ?Name: JAYLEI FUERTE ?MRN: [id number] ?Date of Birth: July 19, 1956 ? ?Transition of Care (TOC) CM/SW Contact  ?Pete Pelt, RN ?Phone Number: ?11/26/2021, 12:48 PM ? ?Clinical Narrative:   Awaiting medical discharge.  May need wound care and additional services.  Cheryl at [facility]. ? ? ? ?Expected Discharge Plan: [place] ?Barriers to Discharge: Continued Medical Work up ? ?Expected Discharge Plan and Services ?Expected Discharge Plan: [place] ?  ?Discharge Planning Services: CM Consult ?Post Acute Care Choice: Home Health, Durable Medical Equipment ?Living arrangements for the past 2 months: [place] ?                ?DME Arranged: 3-N-1, Walker rolling ?DME Agency: [employer] ?Date DME Agency Contacted: 11/24/21 ?Time DME Agency Contacted: [phone] ?Representative spoke with at DME Agency: via text to [facility] ?HH Arranged: PT, OT ?  ?  ?  ?  ? ? ?Social Determinants of Health (SDOH) Interventions ?  ? ?Readmission Risk Interventions ?   ? View : No data to display.  ?  ?  ?  ? ? ?

## 2021-11-26 NOTE — Progress Notes (Signed)
Nutrition Follow-up ? ?DOCUMENTATION CODES:  ? ?Severe malnutrition in context of chronic illness ? ?INTERVENTION:  ? ?-Continue Ensure Enlive po TID, each supplement provides 350 kcal and 20 grams of protein ?-Continue MVI with minerals daily ?-Check zinc level for potential deficiency (taste changes) ? ?NUTRITION DIAGNOSIS:  ? ?Severe Malnutrition related to chronic illness (MS) as evidenced by energy intake < or equal to 75% for > or equal to 1 month, severe fat depletion, severe muscle depletion. ? ?Ongoing ? ?GOAL:  ? ?Patient will meet greater than or equal to 90% of their needs ? ?Progressing  ? ?MONITOR:  ? ?PO intake, Supplement acceptance, Diet advancement ? ?REASON FOR ASSESSMENT:  ? ?Consult ?Assessment of nutrition requirement/status ? ?ASSESSMENT:  ? ?Pt with history of depression with anxiety, multiple sclerosis, vestibular schwannoma, hypertension, history of hyperlipidemia, recent admission for shortness of breath and was found to have right pleural effusion who presented with progressively worsening shortness of breath. ? ?5/8- s/p rt pleurex catheter placement ? ?Reviewed I/O's: -100 ml x 24 hours and +220 ml since admission ? ?UOP: 250 ml x 24 hours  ? ?Pt sleeping soundly in recliner at time of visit. No family at bedside. Pt did not arouse to name being called.  ? ?Pt remains with fair appetite. Noted meal completions 0-50%. Pt complained of taste changes and zinc labs has been ordered, but no results back yet.  ? ?Medications reviewed and include miralax and senokot, which were ordered yesterday (11/25/21). Pt's last documented BM was on 11/21/21.  ? ?Labs reviewed.  ? ?Diet Order:   ?Diet Order   ? ?       ?  Diet regular Room service appropriate? Yes; Fluid consistency: Thin  Diet effective now       ?  ? ?  ?  ? ?  ? ? ?EDUCATION NEEDS:  ? ?Education needs have been addressed ? ?Skin:  Skin Assessment: Skin Integrity Issues: ?Skin Integrity Issues:: Incisions ?Incisions: closed rt posterior  back ? ?Last BM:  11/21/21 ? ?Height:  ? ?Ht Readings from Last 1 Encounters:  ?11/22/21 4\' 10"  (1.473 m)  ? ? ?Weight:  ? ?Wt Readings from Last 1 Encounters:  ?11/22/21 40.5 kg  ? ? ?Ideal Body Weight:  43.9 kg ? ?BMI:  Body mass index is 18.64 kg/m?. ? ?Estimated Nutritional Needs:  ? ?Kcal:  1550-1750 ? ?Protein:  80-95 grams ? ?Fluid:  > 1.5 L ? ? ? ?Loistine Chance, RD, LDN, CDCES ?Registered Dietitian II ?Certified Diabetes Care and Education Specialist ?Please refer to [facility] for RD and/or RD on-call/weekend/after hours pager  ?

## 2021-11-26 NOTE — Progress Notes (Signed)
Came back to speak with patient at the bedside. ? ?Restarted 5 mg of oxycodone this morning as needed for moderate pain.  The patient stated that she has taken numerous pain medications in the past.  We will go up to 10 mg of oxycodone. ? ?The patient stated she would rather be on Zoloft and Cymbalta.  I will switch medications for tomorrow.  Added Valium 10 mg as needed for anxiety. ? ?Dr. Tasia Catchings oncology will see her tomorrow to discuss treatment options. ? ?I explained to the patient if she decides not to do any treatment for this that she be a candidate for hospice.  If she decides to do treatment then we can have palliative follow as outpatient. ? ?Dr. Loletha Grayer ?

## 2021-11-26 NOTE — Progress Notes (Signed)
?Progress Note ? ? ?Patient: Carly Abbott OHK:[id number] DOB: 01-21-1956 DOA: 11/22/2021     3 ?DOS: the patient was seen and examined on 11/26/2021 ?  ? ?Assessment and Plan: ?* Metastatic adenocarcinoma ([place]) ?Pleural fluid came back metastatic adenocarcinoma likely lung origin.  Dr. Tasia Catchings notified. ? ?Pleural effusion on right ?Patient underwent thoracentesis on 5/5 with 1.2 L fluid removed.  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 ?--IR placed a Pleurx drainage catheter on 5/8 ?-- Oncology follow-up ?-- Add oral oxycodone for pain ?-- Intermittent drainage. ?-- TOC working on home health ? ?Acute respiratory failure with hypoxia ([place]) ?This morning patient on 5 L of oxygen.  On admission on 6 L had a pulse ox of 90% ? ?Protein-calorie malnutrition, severe ?related to chronic illness  ? ?Weight loss, unintentional ?Likely secondary to malignant process and not eating well. ?Body mass index is 18.64 kg/m?. ? ? ?Hypokalemia ?Replaced insulin normal range as of 11/25/2021 ? ?Hypotension ?Hypotension tachycardia on low-dose midodrine. ? ?Mid back pain ?Lidocaine patch ? ?SIRS (systemic inflammatory response syndrome) (HCC) ?On admission, patient met SIRS criteria with tachypnea and tachycardia.  No evidence for active infection  ? ? ?Hypoalbuminemia due to protein-calorie malnutrition ([place]) ?Dietitian consulted ? ?Anxiety and depression ?--Continue home duloxetine ?--Ativan 0.5 mg IV qhs PRN ? ?Hyperlipidemia ?Appears not on medication ? ?MS (multiple sclerosis) ([place]) ?-- Continue home Flexeril 10 mg p.o. twice daily ? ? ? ? ?  ? ?Subjective: Patient seen as nurse was trying off fluid from the Pleurx catheter.  Patient was in a lot of pain.  Still on 5 L of oxygen.  Still with some cough and shortness of breath. ? ?Physical Exam: ?Vitals:  ? 11/26/21 0019 11/26/21 0447 11/26/21 0805 11/26/21 1143  ?BP: (!) 88/54 (!) 93/55 94/72 111/68  ?Pulse: 88 81 95 94  ?Resp: _0 ?Temp: 98.6 ?F (37 ?C) 98.1 ?F (36.7 ?C)  97.7 ?F (36.5 ?C) 98.2 ?F (36.8 ?C)  ?TempSrc:  Oral    ?SpO2: 96% 96% 94% 98%  ?Weight:      ?Height:      ? ?Physical Exam ?HENT:  ?   Head: Normocephalic.  ?   Mouth/Throat:  ?   Pharynx: No oropharyngeal exudate.  ?Eyes:  ?   General: Lids are normal.  ?   Conjunctiva/sclera: Conjunctivae normal.  ?Cardiovascular:  ?   Rate and Rhythm: Regular rhythm. Tachycardia present.  ?   Heart sounds: Normal heart sounds, S1 normal and S2 normal.  ?Pulmonary:  ?   Breath sounds: Examination of the right-middle field reveals decreased breath sounds. Examination of the right-lower field reveals decreased breath sounds and rhonchi. Decreased breath sounds and rhonchi present. No wheezing.  ?Abdominal:  ?   Palpations: Abdomen is soft.  ?   Tenderness: There is no abdominal tenderness.  ?Musculoskeletal:  ?   Right lower leg: No swelling.  ?   Left lower leg: No swelling.  ?Skin: ?   General: Skin is warm.  ?   Findings: No rash.  ?Neurological:  ?   Mental Status: She is alert and oriented to person, place, and time.  ?  ?Data Reviewed: ?Pathology came back adenocarcinoma ? ?Family Communication: Spoke with brother at the bedside ? ?Disposition: ?Status is: Inpatient ?Remains inpatient appropriate because: Still on 5 L of oxygen and not still in a lot of pain. ?Planned Discharge Destination: Home with Home Health ? ? ?Author: ?Loletha Grayer, MD ?11/26/2021 3:01 PM ? ?For  on call review [email or web address].  ?

## 2021-11-27 ENCOUNTER — Inpatient Hospital Stay
Admit: 2021-11-27 | Discharge: 2021-11-27 | Disposition: A | Discharge status: Home / Self Care | Payer: Medicare Other | Attending: Internal Medicine | Admitting: Internal Medicine

## 2021-11-27 ENCOUNTER — Inpatient Hospital Stay: Admit: 2021-11-27 | Payer: Medicare Other

## 2021-11-27 DIAGNOSIS — G35 Multiple sclerosis: Secondary | ICD-10-CM

## 2021-11-27 DIAGNOSIS — J9601 Acute respiratory failure with hypoxia: Secondary | ICD-10-CM | POA: Diagnosis not present

## 2021-11-27 DIAGNOSIS — Z7189 Other specified counseling: Secondary | ICD-10-CM | POA: Diagnosis not present

## 2021-11-27 DIAGNOSIS — I3139 Other pericardial effusion (noninflammatory): Secondary | ICD-10-CM | POA: Diagnosis not present

## 2021-11-27 DIAGNOSIS — J90 Pleural effusion, not elsewhere classified: Secondary | ICD-10-CM | POA: Diagnosis not present

## 2021-11-27 DIAGNOSIS — C799 Secondary malignant neoplasm of unspecified site: Secondary | ICD-10-CM | POA: Diagnosis not present

## 2021-11-27 LAB — CULTURE, BLOOD (ROUTINE X 2)
Culture: NO GROWTH
Culture: NO GROWTH
Special Requests: ADEQUATE
Special Requests: ADEQUATE

## 2021-11-27 LAB — BRAIN NATRIURETIC PEPTIDE: B Natriuretic Peptide: 55.1 pg/mL (ref 0.0–100.0)

## 2021-11-27 LAB — ZINC: Zinc: 62 ug/dL (ref 44–115)

## 2021-11-27 MED ORDER — AZITHROMYCIN 500 MG PO TABS
250.0000 mg | ORAL_TABLET | Freq: Every day | ORAL | Status: DC
  Administered 2021-11-28 – 2021-11-29 (×2): 250 mg via ORAL
  Filled 2021-11-27 (×2): qty 1

## 2021-11-27 MED ORDER — AZITHROMYCIN 500 MG PO TABS
500.0000 mg | ORAL_TABLET | Freq: Every day | ORAL | Status: AC
  Administered 2021-11-27: 500 mg via ORAL
  Filled 2021-11-27: qty 1

## 2021-11-27 MED ORDER — OXYBUTYNIN CHLORIDE 5 MG PO TABS
5.0000 mg | ORAL_TABLET | Freq: Two times a day (BID) | ORAL | Status: DC
  Administered 2021-11-27 – 2021-11-29 (×5): 5 mg via ORAL
  Filled 2021-11-27 (×5): qty 1

## 2021-11-27 MED ORDER — SODIUM CHLORIDE 0.9 % IV SOLN
2.0000 g | INTRAVENOUS | Status: DC
  Administered 2021-11-27 – 2021-11-29 (×3): 2 g via INTRAVENOUS
  Filled 2021-11-27 (×2): qty 2
  Filled 2021-11-27: qty 20
  Filled 2021-11-27: qty 2
  Filled 2021-11-27: qty 20

## 2021-11-27 MED ORDER — OXYCODONE HCL ER 10 MG PO T12A
10.0000 mg | EXTENDED_RELEASE_TABLET | Freq: Two times a day (BID) | ORAL | Status: DC
  Administered 2021-11-27 – 2021-11-30 (×6): 10 mg via ORAL
  Filled 2021-11-27 (×6): qty 1

## 2021-11-27 NOTE — Progress Notes (Signed)
Occupational Therapy Treatment ?Patient Details ?Name: Carly Abbott ?MRN: [id number] ?DOB: 11-Oct-1955 ?Today's Date: 11/27/2021 ? ? ?History of present illness Pt is a 66 year old female with history of depression with anxiety, multiple sclerosis, vestibular schwannoma, hypertension, history of hyperlipidemia, recent admission for shortness of breath and was found to have right pleural effusion who presents emergency department for chief concerns of shortness of breath. Pt s/p thoracentesis during recent prior admission now re-admitted with MD assessment that includes: Pleural effusion on right, mild back pain, cough, SIRS, and hypoalbuminemia due to protein-calorie malnutrition. ?  ?OT comments ? Pt sitting up in chair upon OT arrival.  Agreeable to amb to bathroom.  Pt required min A to maneuver walker in tight quarters around bed.  Pt was able to void in commode, requiring min A in standing for clothing management.  3in1 had been delivered to hospital room.  OT reviewed it's 3 uses and how to adjust height with pt verbalizing understanding.  Reinforced using incentive spirometer hourly when awake.  Pt able to complete 10 reps with spirometer during OT session.  Provided written reminder for use on white board per pt request.  02 sats ranged from 90-93% with activity on 4L 02.  Assisted pt back to bed and chaplain present upon OT exit.  Bed alarm set and all necessary items within reach. Will continue to follow to maximize safety and indep with ADLs in the acute setting.  Continue to recommend [facility] OT at discharge.  ? ?Recommendations for follow up therapy are one component of a multi-disciplinary discharge planning process, led by the attending physician.  Recommendations may be updated based on patient status, additional functional criteria and insurance authorization. ?   ?Follow Up Recommendations ? Home health OT  ?  ?Assistance Recommended at Discharge Intermittent Supervision/Assistance  ?Patient can return home  with the following ? A little help with walking and/or transfers;A little help with bathing/dressing/bathroom;Assistance with cooking/housework ?  ?Equipment Recommendations ? BSC/3in1 (BSC delivered and in room today)  ?  ?Recommendations for Other Services   ? ?  ?Precautions / Restrictions Precautions ?Precautions: Fall ?Restrictions ?Weight Bearing Restrictions: No  ? ? ?  ? ?Mobility Bed Mobility ?Overal bed mobility: Modified Independent ?  ?  ?  ?  ?  ?  ?General bed mobility comments: sit to supine with HOB elevated with use of bedrail for support ?Patient Response: Cooperative ? ?Transfers ?Overall transfer level: Needs assistance ?Equipment used: Rolling walker (2 wheels) ?Transfers: Sit to/from Stand ?Sit to Stand: Min guard ?  ?  ?  ?  ?  ?General transfer comment: CGA with RWW ?  ?  ?Balance Overall balance assessment: Modified Independent ?Sitting-balance support: Feet unsupported, Single extremity supported ?Sitting balance-Leahy Scale: Good ?  ?  ?Standing balance support: Bilateral upper extremity supported, During functional activity ?Standing balance-Leahy Scale: Good ?Standing balance comment: close supv-min guard for balance to manage clothing for toileting tasks ?  ?  ?  ?  ?  ?  ?  ?  ?  ?  ?  ?   ? ?ADL either performed or assessed with clinical judgement  ? ?ADL Overall ADL's : Needs assistance/impaired ?Eating/Feeding: Set up ?  ?  ?  ?  ?  ?  ?  ?  ?  ?  ?  ?Toilet Transfer: Minimal assistance;Grab bars;Rolling walker (2 wheels) ?Toilet Transfer Details (indicate cue type and reason): walked from recliner to bathroom toilet to void ?Toileting- Clothing Manipulation and Hygiene: Minimal  assistance;Sit to/from stand ?Toileting - Clothing Manipulation Details (indicate cue type and reason): min A to hike brief, min guard to maintain balance ?  ?  ?Functional mobility during ADLs: Min guard;Rolling walker (2 wheels) ?General ADL Comments: 02 sats 90-93% on 4L 02 ?  ? ?Extremity/Trunk  Assessment Upper Extremity Assessment ?Upper Extremity Assessment: Overall WFL for tasks assessed ?  ?Lower Extremity Assessment ?Lower Extremity Assessment: Generalized weakness ?RLE Sensation: history of peripheral neuropathy ?  ?Cervical / Trunk Assessment ?Cervical / Trunk Assessment: Normal ?  ? ?Vision Patient Visual Report: No change from baseline ?  ?  ?   ?  ?   ?  ? ?Cognition Arousal/Alertness: Awake/alert ?Behavior During Therapy: [facility] for tasks assessed/performed ?Overall Cognitive Status: Within Functional Limits for tasks assessed ?  ?  ?  ?  ?  ?  ?  ?  ?  ?  ?  ?  ?  ?  ?  ?  ?  ?  ?  ?   ?   ? ?  ?   ? ? ?  ?General Comments SpO2 remained from 91-92% throughout ambulation and HR remained at 110 during ambulation.  ? ? ?Pertinent Vitals/ Pain       Pain Assessment ?Pain Score: 6  ?Pain Location: back, R side of torso ?Pain Descriptors / Indicators: Sore, Stabbing ?Pain Intervention(s): Limited activity within patient's tolerance, Monitored during session, Repositioned ? ?Home Living   ?  ?  ?  ?  ?  ?  ?  ?  ?  ?  ?  ?  ?  ?  ?  ?  ?  ?  ? ?  ?Prior Functioning/Environment    ?  ?  ?  ?   ? ?Frequency ? Min 2X/week  ? ? ? ? ?  ?Progress Toward Goals ? ?OT Goals(current goals can now be found in the care plan section) ? Progress towards OT goals: Progressing toward goals ? ?Acute Rehab OT Goals ?Patient Stated Goal: to go home ?OT Goal Formulation: With patient/family ?Time For Goal Achievement: 12-11-21 ?Potential to Achieve Goals: Good  ?Plan Discharge plan remains appropriate   ? ? ? ? ?   ?  ?  ?  ?  ? ?  ?AM-PAC OT "6 Clicks" Daily Activity     ?Outcome Measure ? ? Help from another person eating meals?: None ?Help from another person taking care of personal grooming?: A Little ?Help from another person toileting, which includes using toliet, bedpan, or urinal?: A Little ?Help from another person bathing (including washing, rinsing, drying)?: A Little ?Help from another person to put on and  taking off regular upper body clothing?: None ?Help from another person to put on and taking off regular lower body clothing?: A Little ?6 Click Score: 20 ? ?  ?End of Session Equipment Utilized During Treatment: Rolling walker (2 wheels);Gait belt ? ?OT Visit Diagnosis: Other abnormalities of gait and mobility (R26.89) ?  ?Activity Tolerance Patient tolerated treatment well ?  ?Patient Left in bed;with bed alarm set;Other (comment) (Chaplain present end of OT session) ?  ?   ?  ? ?   ? ?Time: 2878-6767 ?OT Time Calculation (min): 35 min ? ?Charges: OT General Charges ?$OT Visit: 1 Visit ?OT Treatments ?$Self Care/Home Management : 23-37 mins ? ?Leta Speller, MS, OTR/L ? ? ?Darleene Cleaver ?11/27/2021, 5:11 PM ?

## 2021-11-27 NOTE — Progress Notes (Signed)
?   11/27/21 1600  ?Clinical Encounter Type  ?Visited With Patient  ?Visit Type Initial  ?Referral From Nurse  ? ?Chaplain gave and explained Advance Directive information to patient. ?

## 2021-11-27 NOTE — Assessment & Plan Note (Addendum)
Echocardiogram shows moderate pericardial effusion without evidence of tamponade.  Cardiology recommended cardiothoracic consultation for pericardial window.  Case discussed with Dr. Darcey Nora and he will see the patient in consultation when the patient is transferred over to [facility]. ?

## 2021-11-27 NOTE — Consult Note (Addendum)
[facility] CLINIC CARDIOLOGY CONSULT NOTE       Patient ID: Carly Abbott MRN: [id number] DOB/AGE: [id number] y.o.  Admit date: 11/22/2021 Referring Physician Manuela Schwartz, NP Primary Physician Dr. Marisue Ivan Primary Cardiologist none Reason for Consultation pericardial effusion seen on CTA chest  HPI: Carly Abbott is a 66 year old female with a past medical history of multiple sclerosis with chronic L foot drop, vestibular schwannoma, hypertension, hyperlipidemia, depression, former heavy smoker with recent admission from 5/4 to 5/6 with shortness of breath where she underwent a right thoracentesis where cytology was positive for adenocarcinoma, c/w primary lung malignancy. She was readmitted the same day of discharge on 5/6 with persistent shortness of breath and reaccumulation of pleural fluid.  She underwent right Pleurx catheter placement on 5/8. On hospital day 5 she remained with an increased oxygen requirement of 5L by [place] so CTA chest was performed which was negative for pulmonary embolism but revealed a moderate to large pericardial effusion small bilateral pleural effusions.  Cardiology was consulted because of the pericardial effusion.   Patient was seen and examined this morning, sitting in bed looking for her glasses.  She states she has been having worsening shortness of breath since she was discharged on 5/6 to the point where she could barely ambulate in her house.  She remains with this shortness of breath and some pleuritic discomfort on her right thorax and is still unsure about the route of treatment she wants to go for her cancer and is eager to have further discussions regarding that with oncology.  She has noted significant weight loss recently but denies chest pain, palpitations, lower extremity edema, orthopnea.  She has never seen a cardiologist before, denies history of stress test, heart attack, cardiac catheterization.  She has a family history of CHF in her father.   Former smoker for 30 years.  Does not drink alcohol.  She is a retired Engineer, civil (consulting).  Vitals are notable for blood pressure 95/56.  Heart rate 102.  Remains on 5L by  during interview.     Review of systems complete and found to be negative unless listed above    Past Medical History:  Diagnosis Date   Depression    Glaucoma    Headache    Hyperlipidemia    Multiple sclerosis (HCC)    Spinal stenosis     Past Surgical History:  Procedure Laterality Date   BACK SURGERY     CARPAL TUNNEL RELEASE     CHOLECYSTECTOMY     COLONOSCOPY     COLONOSCOPY WITH PROPOFOL N/A 05/18/2016   Procedure: COLONOSCOPY WITH PROPOFOL;  Surgeon: Scot Jun, MD;  Location: [facility] ENDOSCOPY;  Service: Endoscopy;  Laterality: N/A;   IR PERC PLEURAL DRAIN W/INDWELL CATH W/IMG GUIDE  11/24/2021   LEG ANGIOGRAPHY     TONSILLECTOMY      Medications Prior to Admission  Medication Sig Dispense Refill Last Dose   amLODipine (NORVASC) 5 MG tablet Take 1 tablet (5 mg total) by mouth daily. 30 tablet 2 11/22/2021   DULoxetine (CYMBALTA) 30 MG capsule Take 30 mg by mouth daily.   11/22/2021   oxybutynin (DITROPAN) 5 MG tablet Take 5 mg by mouth daily.   11/22/2021   traMADol (ULTRAM) 50 MG tablet Take 50-100 mg by mouth daily.   11/22/2021   acetaminophen (TYLENOL) 500 MG tablet Take 500 mg by mouth every 6 (six) hours as needed.   prn at prn   cyclobenzaprine (FLEXERIL) 10 MG tablet Take  10 mg by mouth 2 (two) times daily.      latanoprost (XALATAN) 0.005 % ophthalmic solution Place 1 drop into both eyes at bedtime. (Patient not taking: Reported on 11/22/2021) 2.5 mL 12 Not Taking   Multiple Vitamin (MULTIVITAMIN WITH MINERALS) TABS tablet Take 1 tablet by mouth daily. (Patient not taking: Reported on 11/22/2021) 30 tablet 0 Not Taking   Social History   Socioeconomic History   Marital status: Divorced    Spouse name: Not on file   Number of children: Not on file   Years of education: Not on file   Highest education  level: Not on file  Occupational History   Not on file  Tobacco Use   Smoking status: Never   Smokeless tobacco: Never  Substance and Sexual Activity   Alcohol use: No   Drug use: No   Sexual activity: Not Currently  Other Topics Concern   Not on file  Social History Narrative   Not on file   Social Determinants of Health   Financial Resource Strain: Not on file  Food Insecurity: Not on file  Transportation Needs: Not on file  Physical Activity: Not on file  Stress: Not on file  Social Connections: Not on file  Intimate Partner Violence: Not on file    Family History  Problem Relation Age of Onset   Colon cancer Mother    Hypertension Father    Rheum arthritis Brother    Diabetes Brother    Breast cancer Neg Hx      PHYSICAL EXAM General: Ill appearing and cachectic Caucasian female, in no acute distress.  Sitting upright in hospital bed. HEENT:  Normocephalic and atraumatic. Neck:  No JVD.  Lungs: Normal respiratory effort on 5 L by [place].  Decreased breath sounds on the right without appreciable crackles.   Heart: tachy RRR . Normal S1 and S2 without gallops or murmurs. Abdomen: Non-distended appearing.  Msk: Normal strength and tone for age. Extremities: Warm and well perfused. No clubbing, cyanosis.  No peripheral edema.  Neuro: Alert and oriented X 3. Psych:  Answers questions appropriately.   Labs:   Lab Results  Component Value Date   WBC 10.1 11/26/2021   HGB 13.1 11/26/2021   HCT 40.7 11/26/2021   MCV 90.6 11/26/2021   PLT 515 (H) 11/26/2021    Recent Labs  Lab 11/22/21 1843 11/23/21 0404 11/25/21 0430  NA 137   < > 136  K 3.6   < > 3.9  CL 106   < > 99  CO2 25   < > 28  BUN 17   < > 22  CREATININE 0.80   < > 0.74  CALCIUM 8.4*   < > 8.6*  PROT 6.0*  --   --   BILITOT 0.3  --   --   ALKPHOS 89  --   --   ALT 14  --   --   AST 24  --   --   GLUCOSE 142*   < > 136*   < > = values in this interval not displayed.   No results found for:  CKTOTAL, CKMB, CKMBINDEX, TROPONINI No results found for: CHOL No results found for: HDL No results found for: LDLCALC No results found for: TRIG No results found for: CHOLHDL No results found for: LDLDIRECT    Radiology: DG Chest 2 View  Result Date: 11/20/2021 CLINICAL DATA:  Possible sepsis with shortness of breath and cough, initial encounter EXAM: CHEST -  2 VIEW COMPARISON:  None Available. FINDINGS: Cardiac shadow is at the upper limits of normal in size. Lungs are hyperinflated. Right middle lobe pneumonia is seen with associated pleural effusion. No other focal abnormality is seen. IMPRESSION: Right middle lobe pneumonia with associated right-sided effusion. Electronically Signed   By: Alcide Clever M.D.   On: 11/20/2021 19:59   CT Angio Chest Pulmonary Embolism (PE) W or WO Contrast  Result Date: 11/26/2021 CLINICAL DATA:  Chest pain, shortness of breath, hypoxia EXAM: CT ANGIOGRAPHY CHEST WITH CONTRAST TECHNIQUE: Multidetector CT imaging of the chest was performed using the standard protocol during bolus administration of intravenous contrast. Multiplanar CT image reconstructions and MIPs were obtained to evaluate the vascular anatomy. RADIATION DOSE REDUCTION: This exam was performed according to the departmental dose-optimization program which includes automated exposure control, adjustment of the mA and/or kV according to patient size and/or use of iterative reconstruction technique. CONTRAST:  75mL OMNIPAQUE IOHEXOL 350 MG/ML SOLN COMPARISON:  Chest radiograph done on 11/22/2021 FINDINGS: Cardiovascular: Moderate to large pericardial effusion is seen. There is homogeneous enhancement in thoracic aorta. There are no intraluminal filling defects in the pulmonary artery branches. Mediastinum/Nodes: Soft tissue density seen in the subcarinal region of mediastinum may be part of pericardial effusion or suggest enlarged lymph nodes. There are slightly enlarged lymph nodes in both hilar regions.  Lungs/Pleura: Centrilobular emphysema is seen. Increased interstitial markings are seen in both lungs, more so on the right side. There are patchy infiltrates in the parahilar regions and lower lung fields suggesting atelectasis/pneumonia. Small bilateral pleural effusions are seen, more so on the right side. Right chest tube is noted in place. There is no pneumothorax. Upper Abdomen: Colon is interposed between liver and anterior abdominal wall. Stomach appears to be distended with fluid. Musculoskeletal: There is mild decrease in height of upper endplates of bodies of T7 and T9 vertebrae possibly mild old compression fractures. Alignment of posterior margins of vertebral bodies is unremarkable. Review of the MIP images confirms the above findings. IMPRESSION: There is no evidence of pulmonary artery embolism. There is no evidence of thoracic aortic dissection. Moderate to large pericardial effusion. Small bilateral pleural effusions. Infiltrates are seen in the both lungs, more so on the right side suggesting atelectasis/pneumonia. Centrilobular emphysema. Increased interstitial markings are seen in both lungs, more so on the right side suggesting possible interstitial pneumonia or asymmetric interstitial edema. Other findings as described in the body of the report. Electronically Signed   By: Ernie Avena M.D.   On: 11/26/2021 18:30   DG Chest Port 1 View  Result Date: 11/22/2021 CLINICAL DATA:  Questionable sepsis - evaluate for abnormality Shortness of breath. EXAM: PORTABLE CHEST 1 VIEW COMPARISON:  Chest radiograph yesterday and 11/20/2021, patient underwent recent thoracentesis. FINDINGS: Right pleural effusion is increased from radiograph yesterday. Associated increased opacity in the right lower hemithorax may be related to layering pleural effusion or atelectasis/airspace disease. Small left pleural effusion is similar. Stable heart size and mediastinal contours. No pneumothorax. IMPRESSION: 1.  Increased right pleural effusion from radiograph yesterday. Associated increased opacity in the right lower hemithorax may be related to layering pleural fluid or atelectasis/airspace disease. 2. Unchanged small left pleural effusion. Electronically Signed   By: Narda Rutherford M.D.   On: 11/22/2021 18:59   DG Chest Port 1 View  Result Date: 11/21/2021 CLINICAL DATA:  Status post right pleural effusion. EXAM: PORTABLE CHEST 1 VIEW COMPARISON:  Chest x-ray from yesterday. FINDINGS: Stable cardiomediastinal silhouette. Decreased now trace  right pleural effusion status post thoracentesis. Somewhat improved aeration at the right lung base with residual patchy consolidation and interstitial opacity in the right middle lobe. New trace left pleural effusion. No pneumothorax. No acute osseous abnormality. IMPRESSION: 1. Decreased now trace right pleural effusion status post thoracentesis. No pneumothorax. 2. Somewhat improved aeration at the right lung base with residual patchy consolidation and interstitial opacity in the right middle lobe suspicious for pneumonia. Followup PA and lateral chest X-ray is recommended in 3-4 weeks following trial of antibiotic therapy to ensure resolution. 3. New trace left pleural effusion. Electronically Signed   By: Obie Dredge M.D.   On: 11/21/2021 11:28   IR PERC PLEURAL DRAIN W/INDWELL CATH W/IMG GUIDE  Result Date: 11/25/2021 INDICATION: Recurrent pleural effusion, suspected malignancy EXAM: Ultrasound-guided thoracentesis Placement of a tunneled pleural drainage catheter using fluoroscopic guidance MEDICATIONS: The patient is currently admitted to the hospital and receiving intravenous antibiotics. The antibiotics were administered within an appropriate time frame prior to the initiation of the procedure. ANESTHESIA/SEDATION: Moderate (conscious) sedation was employed during this procedure. A total of Versed 2 mg and Fentanyl 100 mcg was administered intravenously by the  radiology nurse. Total intra-service moderate Sedation Time: 29 minutes. The patient's level of consciousness and vital signs were monitored continuously by radiology nursing throughout the procedure under my direct supervision. COMPLICATIONS: None immediate. PROCEDURE: Informed written consent was obtained from the patient after a thorough discussion of the procedural risks, benefits and alternatives. All questions were addressed. Maximal Sterile Barrier Technique was utilized including caps, mask, sterile gowns, sterile gloves, sterile drape, hand hygiene and skin antiseptic. A timeout was performed prior to the initiation of the procedure. The patient was placed supine on the exam table. Limited ultrasound of the right chest was performed for planning purposes, this demonstrated a moderate right pleural effusion. Skin entry site was marked, the overlying skin was prepped and draped in the standard sterile fashion. Local analgesia was obtained with 1% lidocaine. Under ultrasound guidance, access was obtained into the right pleural space using a 19 gauge Yueh catheter. Location was confirmed with ultrasound and return of clear straw-colored pleural fluid. An 035 wire was easily advanced into the right pleural space under fluoroscopic guidance. The percutaneous tract was serially dilated. Attention was then turned to a more inferior and medial location in the chest. After the administration of additional local anesthetic, a small dermatotomy was made. From this location, a cuffed pleural drainage catheter was tunneled to the initial access site. A peel-away sheath was then advanced over the 035 wire, and through the sheath, the pleural drainage catheter was advanced into the right pleural space. Location was confirmed using fluoroscopic guidance. The catheter was tested and found to function appropriately. Approximally 1 L of clear straw-colored pleural fluid was removed. The catheter was secured to the skin using  silk suture and a dressing. The initial access site was closed using 4-0 Vicryl and Dermabond. The patient tolerated the procedure well without immediate complication. IMPRESSION: Successful placement of a right-sided tunneled pleural drainage catheter using ultrasound and fluoroscopic guidance. Refer to instructions in EMR for use. Electronically Signed   By: Olive Bass M.D.   On: 11/25/2021 07:37   US THORACENTESIS ASP PLEURAL SPACE W/IMG GUIDE  Result Date: 11/21/2021 INDICATION: Patient admitted with right middle lobe pneumonia and found to have right-sided pleural effusion. Interventional radiology asked to perform a diagnostic and therapeutic thoracentesis. EXAM: ULTRASOUND GUIDED THORACENTESIS MEDICATIONS: 1% lidocaine 10 mL COMPLICATIONS: None immediate. PROCEDURE:  An ultrasound guided thoracentesis was thoroughly discussed with the patient and questions answered. The benefits, risks, alternatives and complications were also discussed. The patient understands and wishes to proceed with the procedure. Written consent was obtained. Ultrasound was performed to localize and mark an adequate pocket of fluid in the right chest. The area was then prepped and draped in the normal sterile fashion. 1% Lidocaine was used for local anesthesia. Under ultrasound guidance a 6 Fr Safe-T-Centesis catheter was introduced. Thoracentesis was performed. The catheter was removed and a dressing applied. FINDINGS: A total of approximately 1.2 L of clear yellow fluid was removed. Samples were sent to the laboratory as requested by the clinical team. IMPRESSION: Successful ultrasound guided right thoracentesis yielding 1.2 L of pleural fluid. Read by: Alwyn Ren, NP Electronically Signed   By: Acquanetta Belling M.D.   On: 11/21/2021 11:50    ECHO no prior  TELEMETRY reviewed by me: Mostly sinus tachycardia with rate in the low 100s  EKG reviewed by me: Sinus tach with low voltage LAA rate 105  ASSESSMENT AND PLAN:   Carly Abbott is a 66 year old female with a past medical history of multiple sclerosis with chronic L foot drop, vestibular schwannoma, hypertension, hyperlipidemia, depression, former heavy smoker with recent admission from 5/4 to 5/6 with shortness of breath where she underwent a right thoracentesis where cytology was positive for adenocarcinoma, c/w primary lung malignancy. She was readmitted the same day of discharge on 5/6 with persistent shortness of breath and reaccumulation of pleural fluid.  She underwent right Pleurx catheter placement on 5/8. On hospital day 5 she remained with an increased oxygen requirement of 5L by [place] so CTA chest was performed which was negative for pulmonary embolism but revealed a moderate to large pericardial effusion small bilateral pleural effusions.  Cardiology was consulted because of the pericardial effusion.   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  She is being followed by oncology where various treatment options are being considered.  Given her persistent oxygen requirement (5 L today) a CTA chest was ordered to rule out PE given her hypercoagulability with her malignancy which was negative for PE but revealed a moderate to large pericardial effusion.  She is currently hemodynamically stable without tamponade physiology, her biggest complaint is her persistent shortness of breath and pain in her right thorax. -Appreciate oncology assistance with management/treatment options of her lung malignancy -Agree with low-dose midodrine for blood pressure support. -Obtain echocardiogram complete for further characterization of the size of the pericardial  effusion -Based on echo results, consider pericardial window for drainage of the effusion by CT surgery, as with her lung malignancy and recurrent pleural effusions this is likely to reaccumulate.  This patient's plan of care was discussed and created with Dr. Gwen Pounds and he is in agreement.  Signed: Rebeca Allegra , PA-C 11/27/2021, 8:01 AM [facility] Cardiology  The patient has been interviewed and examined. I agree with assessment and plan above. Arnoldo Hooker MD [facility]

## 2021-11-27 NOTE — Progress Notes (Signed)
*  PRELIMINARY RESULTS* ?Echocardiogram ?2D Echocardiogram has been performed. ? ?Carly Abbott, Carly Abbott ?11/27/2021, 1:59 PM ?

## 2021-11-27 NOTE — Progress Notes (Signed)
Physical Therapy Treatment ?Patient Details ?Name: Carly Abbott ?MRN: [id number] ?DOB: Jun 06, 1956 ?Today's Date: 11/27/2021 ? ? ?History of Present Illness Pt is a 66 year old female with history of depression with anxiety, multiple sclerosis, vestibular schwannoma, hypertension, history of hyperlipidemia, recent admission for shortness of breath and was found to have right pleural effusion who presents emergency department for chief concerns of shortness of breath. Pt s/p thoracentesis during recent prior admission now re-admitted with MD assessment that includes: Pleural effusion on right, mild back pain, cough, SIRS, and hypoalbuminemia due to protein-calorie malnutrition. ? ?  ?PT Comments  ? ? Pt presents alert and oriented supine in bed with brother present and agreeable to PT. She shows an improvement in mobility and activity tolerance with ability to ambulate 300 ft with supervision and with HR and Sp02 remaining stable throughout. Pt did not have any episode of unsteadiness during bout of walking despite right foot drop. PT continues to recommend that pt is safe to discharge home with 24/7 assistance and care from brother and with additional [place].  ?    ?Recommendations for follow up therapy are one component of a multi-disciplinary discharge planning process, led by the attending physician.  Recommendations may be updated based on patient status, additional functional criteria and insurance authorization. ? ?Follow Up Recommendations ? Home health PT ?  ?  ?Assistance Recommended at Discharge Frequent or constant Supervision/Assistance  ?Patient can return home with the following A little help with walking and/or transfers;A little help with bathing/dressing/bathroom;Assistance with cooking/housework;Assist for transportation;Help with stairs or ramp for entrance ?  ?Equipment Recommendations ? Rolling walker (2 wheels)  ?  ?Recommendations for Other Services   ? ? ?  ?Precautions / Restrictions  Precautions ?Precautions: None  ?  ? ?Mobility ? Bed Mobility ?Overal bed mobility: Modified Independent ?  ?  ?  ?  ?  ?  ?  ?Patient Response: Cooperative ? ?Transfers ?Overall transfer level: Modified independent ?Equipment used: Rolling walker (2 wheels) ?Transfers: Sit to/from Stand ?Sit to Stand: Min guard ?  ?  ?  ?  ?  ?General transfer comment: CGA with RWW ?  ? ?Ambulation/Gait ?Ambulation/Gait assistance: Supervision, Min guard ?Gait Distance (Feet): 300 Feet ?Assistive device: Rolling walker (2 wheels) ?Gait Pattern/deviations: Decreased dorsiflexion - left, Step-through pattern, Trunk flexed, Decreased stride length ?Gait velocity: decreased ?Gait velocity interpretation: <1.31 ft/sec, indicative of household ambulator ?  ?General Gait Details: Slow cadence and increased forward flexion during ambulation ? ? ?Stairs ?  ?  ?  ?  ?  ? ? ?Wheelchair Mobility ?  ? ?Modified Rankin (Stroke Patients Only) ?  ? ? ?  ?Balance Overall balance assessment: Modified Independent ?Sitting-balance support: Bilateral upper extremity supported ?Sitting balance-Leahy Scale: Good ?  ?  ?Standing balance support: Bilateral upper extremity supported, During functional activity ?Standing balance-Leahy Scale: Good ?  ?  ?  ?  ?  ?  ?  ?  ?  ?  ?  ?  ?  ? ?  ?Cognition Arousal/Alertness: Awake/alert ?Behavior During Therapy: [facility] for tasks assessed/performed ?Overall Cognitive Status: Within Functional Limits for tasks assessed ?  ?  ?  ?  ?  ?  ?  ?  ?  ?  ?  ?  ?  ?  ?  ?  ?  ?  ?  ? ?  ?Exercises   ? ?  ?General Comments General comments (skin integrity, edema, etc.): SpO2 remained from 91-92% throughout ambulation and HR  remained at 110 during ambulation. ?  ?  ? ?Pertinent Vitals/Pain Pain Assessment ?Pain Assessment: No/denies pain  ? ? ?Home Living   ?  ?  ?  ?  ?  ?  ?  ?  ?  ?   ?  ?Prior Function    ?  ?  ?   ? ?PT Goals (current goals can now be found in the care plan section) Acute Rehab PT Goals ?Patient Stated  Goal: To get stronger and return home ?PT Goal Formulation: With patient ?Time For Goal Achievement: 12/06/21 ?Potential to Achieve Goals: Good ?Progress towards PT goals: Progressing toward goals ? ?  ?Frequency ? ? ? Min 2X/week ? ? ? ?  ?PT Plan Current plan remains appropriate  ? ? ?Co-evaluation   ?  ?  ?  ?  ? ?  ?AM-PAC PT "6 Clicks" Mobility   ?Outcome Measure ? Help needed turning from your back to your side while in a flat bed without using bedrails?: None ?Help needed moving from lying on your back to sitting on the side of a flat bed without using bedrails?: None ?Help needed moving to and from a bed to a chair (including a wheelchair)?: A Little ?Help needed standing up from a chair using your arms (e.g., wheelchair or bedside chair)?: A Little ?Help needed to walk in hospital room?: A Little ?Help needed climbing 3-5 steps with a railing? : A Little ?6 Click Score: 20 ? ?  ?End of Session Equipment Utilized During Treatment: Gait belt;Oxygen ?Activity Tolerance: Patient tolerated treatment well ?Patient left: in chair;with call bell/phone within reach;with chair alarm set;with family/visitor present ?Nurse Communication: Other (comment) (Need for charged HR monitor battery and pt has question about pain meds) ?PT Visit Diagnosis: Unsteadiness on feet (R26.81);Muscle weakness (generalized) (M62.81);Difficulty in walking, not elsewhere classified (R26.2) ?  ? ? ?Time: 5537-4827 ?PT Time Calculation (min) (ACUTE ONLY): 26 min ? ?Charges:  $Gait Training: 23-37 mins          ?          ? ? ?Daneil Dan PT, DPT ?11/27/2021, 1:32 PM ? ?

## 2021-11-27 NOTE — Progress Notes (Signed)
? ?                                                                                                                                                     ?                                                   ?Daily Progress Note  ? ?Patient Name: Carly Abbott       Date: 11/27/2021 ?DOB: 05-22-1956  Age: 66 y.o. MRN#: [id number] ?Attending Physician: Loletha Grayer, MD ?Primary Care Physician: Dion Body, MD ?Admit Date: 11/22/2021 ? ?Reason for Consultation/Follow-up: Establishing goals of care ? ?Subjective: ?Notes and labs reviewed. Patient is sitting in bedside chair; brother is present. She states her pain is well controlled. She has been well updated and discusses her poor prognosis. She states she is waiting to see if she is a candidate for immunologic therapy and if so, what the treatment may add in regards to time.  ? ?We discussed in detail symptom management, and hospice. She will wait for  discussion with oncology and make decisions from there.  ? ?Length of Stay: 4 ? ?Current Medications: ?Scheduled Meds:  ? acetaminophen  1,000 mg Oral Q8H  ? Or  ? acetaminophen  650 mg Rectal Q8H  ? [START ON 11/28/2021] azithromycin  250 mg Oral Daily  ? cyclobenzaprine  10 mg Oral BID  ? feeding supplement  237 mL Oral TID BM  ? guaiFENesin  600 mg Oral BID  ? latanoprost  1 drop Both Eyes QHS  ? lidocaine  1 patch Transdermal Q24H  ? methocarbamol  500 mg Oral Q8H  ? midodrine  5 mg Oral BID WC  ? multivitamin with minerals  1 tablet Oral Daily  ? oxybutynin  5 mg Oral BID  ? oxyCODONE  10 mg Oral Q12H  ? polyethylene glycol  17 g Oral Daily  ? predniSONE  40 mg Oral Q breakfast  ? Ensure Max Protein  11 oz Oral BID  ? senna-docusate  1 tablet Oral BID  ? sertraline  50 mg Oral Daily  ? traMADol  50-100 mg Oral Daily  ? ? ?Continuous Infusions: ? cefTRIAXone (ROCEPHIN)  IV 2 g (11/27/21 1256)  ? methocarbamol (ROBAXIN) IV    ? ? ?PRN Meds: ?diazepam, lidocaine, morphine injection, ondansetron **OR** ondansetron  (ZOFRAN) IV, oxyCODONE ? ?Physical Exam ?Pulmonary:  ?   Effort: Pulmonary effort is normal.  ?Neurological:  ?   Mental Status: She is alert.  ?         ? ?Vital Signs: BP (!) 95/56 (BP Location: Right Arm)   Pulse 87   Temp (!) 97.5 ?  F (36.4 ?C) (Oral)   Resp 15   Ht 4\' 10"  (1.473 m)   Wt 40.5 kg   SpO2 97%   BMI 18.64 kg/m?  ?SpO2: SpO2: 97 % ?O2 Device: O2 Device: Nasal Cannula ?O2 Flow Rate: O2 Flow Rate (L/min): 5 L/min ? ?Intake/output summary: No intake or output data in the 24 hours ending 11/27/21 1313 ?LBM: Last BM Date : 11/25/21 ?Baseline Weight: Weight: 38.6 kg ?Most recent weight: Weight: 40.5 kg ? ? ?Patient Active Problem List  ? Diagnosis Date Noted  ? Metastatic adenocarcinoma ([place]) 11/26/2021  ? Acute respiratory failure with hypoxia ([place]) 11/26/2021  ? Weight loss, unintentional 11/24/2021  ? Protein-calorie malnutrition, severe 11/24/2021  ? Hypotension 11/23/2021  ? Hypokalemia 11/23/2021  ? Recurrent right pleural effusion 11/23/2021  ? SIRS (systemic inflammatory response syndrome) ([place]) 11/22/2021  ? Mid back pain 11/22/2021  ? Hypoalbuminemia due to protein-calorie malnutrition ([place])   ? Community acquired pneumonia 11/20/2021  ? Hyperlipidemia 11/20/2021  ? Anxiety and depression 11/20/2021  ? Pleural effusion on right 11/20/2021  ? MS (multiple sclerosis) ([place]) 09/30/2021  ? ? ?Palliative Care Assessment & Plan  ? ? ?Recommendations/Plan: ? ?Patient is waiting to see if she is a candidate for immunologics and if so what the therapy may add, and then will make decisions on care moving forward.  ? ?Recommend follow up with Billey Chang NP for palliative medicine in the cancer center.   ? ? ?Code Status: ? ?  ?Code Status Orders  ?(From admission, onward)  ?  ? ? ?  ? ?  Start     Ordered  ? 11/22/21 2210  Do not attempt resuscitation (DNR)  Continuous       ?Question Answer Comment  ?In the event of cardiac or respiratory ARREST Do not call a ?code blue?   ?In the event of cardiac  or respiratory ARREST Do not perform Intubation, CPR, defibrillation or ACLS   ?In the event of cardiac or respiratory ARREST Use medication by any route, position, wound care, and other measures to relive pain and suffering. May use oxygen, suction and manual treatment of airway obstruction as needed for comfort.   ?  ? 11/22/21 2210  ? ?  ?  ? ?  ? ?Code Status History   ? ? Date Active Date Inactive Code Status Order ID Comments User Context  ? 11/22/2021 2021 11/22/2021 2210 Full Code [id number]  Criss Alvine, DO ED  ? 11/20/2021 2036 11/22/2021 1829 Full Code [id number]  Cox, Briant Cedar, DO ED  ? ?  ? ? ?Prognosis: ? < 6 months ? ? ? ?Care plan was discussed with team via epic chat ? ?Thank you for allowing the Palliative Medicine Team to assist in the care of this patient. ? ? ?Asencion Gowda, NP ? ?Please contact Palliative Medicine Team phone at [phone] for questions and concerns.  ? ? ? ? ? ?

## 2021-11-27 NOTE — Progress Notes (Signed)
?Progress Note ? ? ?Patient: Carly Abbott SVX:[id number] DOB: 05/27/1956 DOA: 11/22/2021     4 ?DOS: the patient was seen and examined on 11/27/2021 ?  ? ? ?Assessment and Plan: ?* Metastatic adenocarcinoma ([place]) ?Pleural fluid came back metastatic adenocarcinoma likely lung origin.  Appreciate consultation with Dr. Tasia Catchings. Pathology required further cytology to be sent to the lab for immunochemical studies.  CT scan of the chest showing large pericardial effusion.  Echocardiogram pending. ? ?Pleural effusion on right ?Patient underwent thoracentesis on 5/5 with 1.2 L fluid removed.  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 ?--IR placed a Pleurx drainage catheter on 5/8 ?-- Oncology follow-up ?-- Oral oxycodone increased to 10 mg as needed for pain ?-- Added OxyContin 10 mg twice a day ? ?Pericardial effusion ?Echocardiogram still pending.  Depending on results will depend on whether we can watch things clinically or whether need a surgical approach. ? ?Acute respiratory failure with hypoxia ([place]) ?This morning patient on 3.75 L of oxygen.  On admission on 6 L had a pulse ox of 90%.  Try to taper oxygen as much as possible.  Likely will need home oxygen. ? ?Protein-calorie malnutrition, severe ?related to chronic illness  ? ?Weight loss, unintentional ?Likely secondary to malignant process and not eating well. ?Body mass index is 18.64 kg/m?. ? ? ?Hypokalemia ?Replaced insulin normal range as of 11/25/2021 ? ?Hypotension ?Hypotension and tachycardia on low-dose midodrine. ? ?Mid back pain ?Lidocaine patch ? ?SIRS (systemic inflammatory response syndrome) (HCC) ?CT scan showing pneumonia.  Added Rocephin and Zithromax. ? ? ?Hypoalbuminemia due to protein-calorie malnutrition ([place]) ?Dietitian consulted ? ?Anxiety and depression ?--Continue home duloxetine ?--Ativan 0.5 mg IV qhs PRN ? ?MS (multiple sclerosis) ([place]) ?Continue home Flexeril 10 mg p.o. twice daily ? ? ? ? ?  ? ?Subjective: Patient in a lot of pain especially  after drainage of Pleurx catheter.  Having a lot of cough.  Some shortness of breath.  Poor appetite secondary to poor tasting food. ? ?Physical Exam: ?Vitals:  ? 11/26/21 2105 11/27/21 0046 11/27/21 0410 11/27/21 0840  ?BP: 112/61 105/68 (!) 87/55 (!) 95/56  ?Pulse: 97 98 84 87  ?Resp: (!) 24 16 16 15   ?Temp: 98.4 ?F (36.9 ?C) 98.2 ?F (36.8 ?C) (!) 97.5 ?F (36.4 ?C)   ?TempSrc: Oral Oral Oral Oral  ?SpO2: 95% 97% 97% 97%  ?Weight:      ?Height:      ? ?Physical Exam ?HENT:  ?   Head: Normocephalic.  ?   Mouth/Throat:  ?   Pharynx: No oropharyngeal exudate.  ?Eyes:  ?   General: Lids are normal.  ?   Conjunctiva/sclera: Conjunctivae normal.  ?Cardiovascular:  ?   Rate and Rhythm: Regular rhythm. Tachycardia present.  ?   Heart sounds: Normal heart sounds, S1 normal and S2 normal.  ?Pulmonary:  ?   Breath sounds: Examination of the right-middle field reveals decreased breath sounds. Examination of the right-lower field reveals decreased breath sounds and rhonchi. Examination of the left-lower field reveals decreased breath sounds. Decreased breath sounds and rhonchi present. No wheezing or rales.  ?Abdominal:  ?   Palpations: Abdomen is soft.  ?   Tenderness: There is no abdominal tenderness.  ?Musculoskeletal:  ?   Right lower leg: No swelling.  ?   Left lower leg: No swelling.  ?Skin: ?   General: Skin is warm.  ?   Findings: No rash.  ?Neurological:  ?   Mental Status: She is alert and oriented  to person, place, and time.  ?  ?Data Reviewed: ?Echocardiogram pending ?CT scan of the chest shows moderate to large pericardial effusion, small bilateral pleural effusions.  No pulmonary embolism.  Infiltrates seen in both lungs. ? ?Family Communication: Spoke with family at the bedside ? ?Disposition: ?Status is: Inpatient ?Remains inpatient appropriate because: Still trying to control pain and taper down oxygen.  With new pericardial effusion need to see you if we need to do anything for this prior to disposition  also. ? ?Planned Discharge Destination: Home with Home Health ? ?Author: ?Loletha Grayer, MD ?11/27/2021 2:52 PM ? ?For on call review [email or web address].  ?

## 2021-11-28 ENCOUNTER — Encounter: Payer: Self-pay | Admitting: Internal Medicine

## 2021-11-28 ENCOUNTER — Inpatient Hospital Stay: Payer: Medicare Other

## 2021-11-28 DIAGNOSIS — C799 Secondary malignant neoplasm of unspecified site: Secondary | ICD-10-CM | POA: Diagnosis not present

## 2021-11-28 DIAGNOSIS — J9601 Acute respiratory failure with hypoxia: Secondary | ICD-10-CM | POA: Diagnosis not present

## 2021-11-28 DIAGNOSIS — J90 Pleural effusion, not elsewhere classified: Secondary | ICD-10-CM | POA: Diagnosis not present

## 2021-11-28 DIAGNOSIS — J189 Pneumonia, unspecified organism: Secondary | ICD-10-CM

## 2021-11-28 DIAGNOSIS — E8809 Other disorders of plasma-protein metabolism, not elsewhere classified: Secondary | ICD-10-CM | POA: Diagnosis not present

## 2021-11-28 DIAGNOSIS — J9621 Acute and chronic respiratory failure with hypoxia: Secondary | ICD-10-CM

## 2021-11-28 DIAGNOSIS — I3139 Other pericardial effusion (noninflammatory): Secondary | ICD-10-CM | POA: Diagnosis not present

## 2021-11-28 LAB — BASIC METABOLIC PANEL
Anion gap: 8 (ref 5–15)
BUN: 19 mg/dL (ref 8–23)
CO2: 29 mmol/L (ref 22–32)
Calcium: 9 mg/dL (ref 8.9–10.3)
Chloride: 99 mmol/L (ref 98–111)
Creatinine, Ser: 0.61 mg/dL (ref 0.44–1.00)
GFR, Estimated: >60 mL/min (ref >60)
Glucose, Bld: 131 mg/dL — ABNORMAL HIGH (ref 70–99)
Potassium: 4.5 mmol/L (ref 3.5–5.1)
Sodium: 136 mmol/L (ref 135–145)

## 2021-11-28 LAB — CBC
HCT: 38.2 % (ref 36.0–46.0)
Hemoglobin: 12.4 g/dL (ref 12.0–15.0)
MCH: 29.5 pg (ref 26.0–34.0)
MCHC: 32.5 g/dL (ref 30.0–36.0)
MCV: 91 fL (ref 80.0–100.0)
Platelets: 565 10*3/uL — ABNORMAL HIGH (ref 150–400)
RBC: 4.2 MIL/uL (ref 3.87–5.11)
RDW: 12.7 % (ref 11.5–15.5)
WBC: 13.3 10*3/uL — ABNORMAL HIGH (ref 4.0–10.5)
nRBC: 0 % (ref 0.0–0.2)

## 2021-11-28 LAB — ECHOCARDIOGRAM COMPLETE
AV Mean grad: 2.5 mmHg
AV Peak grad: 4.6 mmHg
Ao pk vel: 1.08 m/s
Area-P 1/2: 5.16 cm2
Height: 58 in
S' Lateral: 1.5 cm
Weight: 1427.2 oz

## 2021-11-28 MED ORDER — IOHEXOL 9 MG/ML PO SOLN
500.0000 mL | ORAL | Status: AC
  Administered 2021-11-28 (×2): 500 mL via ORAL

## 2021-11-28 MED ORDER — IOHEXOL 300 MG/ML  SOLN
100.0000 mL | Freq: Once | INTRAMUSCULAR | Status: AC | PRN
  Administered 2021-11-28: 75 mL via INTRAVENOUS

## 2021-11-28 NOTE — Progress Notes (Signed)
Nutrition Follow-up ? ?DOCUMENTATION CODES:  ? ?Severe malnutrition in context of chronic illness ? ?INTERVENTION:  ? ?-Continue Ensure Enlive po TID, each supplement provides 350 kcal and 20 grams of protein ?-Continue MVI with minerals daily ? ?NUTRITION DIAGNOSIS:  ? ?Severe Malnutrition related to chronic illness (MS) as evidenced by energy intake < or equal to 75% for > or equal to 1 month, severe fat depletion, severe muscle depletion. ? ?Ongoing ? ?GOAL:  ? ?Patient will meet greater than or equal to 90% of their needs ? ?Progressing  ? ?MONITOR:  ? ?PO intake, Supplement acceptance, Diet advancement ? ?REASON FOR ASSESSMENT:  ? ?Consult ?Assessment of nutrition requirement/status ? ?ASSESSMENT:  ? ?Pt with history of depression with anxiety, multiple sclerosis, vestibular schwannoma, hypertension, history of hyperlipidemia, recent admission for shortness of breath and was found to have right pleural effusion who presented with progressively worsening shortness of breath. ? ?5/8- s/p rt pleurex catheter placement ?  ? ?Reviewed I/O's: +50 ml x 24 hours ?  ?Pt unavailable at time of visit. Attempted to speak with pt via call to hospital room phone, however, unable to reach.  ? ?Pt remains on regular diet. Noted meal completions 50-100%. Pt consuming Ensure supplements.  ? ?Per palliative care notes, plan to see if pt is an immunotherapy candidate and follow-up with outpatient palliative care.  ? ?Medications reviewed and include miralax, senokot, and prednisone.  ? ?Labs reviewed. Zinc WDL.  ? ?Diet Order:   ?Diet Order   ? ?       ?  Diet regular Room service appropriate? Yes; Fluid consistency: Thin  Diet effective now       ?  ? ?  ?  ? ?  ? ? ?EDUCATION NEEDS:  ? ?Education needs have been addressed ? ?Skin:  Skin Assessment: Skin Integrity Issues: ?Skin Integrity Issues:: Incisions ?Incisions: closed rt posterior back ? ?Last BM:  11/21/21 ? ?Height:  ? ?Ht Readings from Last 1 Encounters:  ?11/22/21 4\' 10"   (1.473 m)  ? ? ?Weight:  ? ?Wt Readings from Last 1 Encounters:  ?11/22/21 40.5 kg  ? ? ?Ideal Body Weight:  43.9 kg ? ?BMI:  Body mass index is 18.64 kg/m?. ? ?Estimated Nutritional Needs:  ? ?Kcal:  1550-1750 ? ?Protein:  80-95 grams ? ?Fluid:  > 1.5 L ? ? ? ?Loistine Chance, RD, LDN, CDCES ?Registered Dietitian II ?Certified Diabetes Care and Education Specialist ?Please refer to [facility] for RD and/or RD on-call/weekend/after hours pager  ?

## 2021-11-28 NOTE — Progress Notes (Signed)
[facility] Encounter Note ? ?Patient: Carly Abbott / Admit Date: 11/22/2021 / Date of Encounter: 11/28/2021, 4:58 PM ? ? ?Subjective: ?Overall patient tolerating her thoracic drain fairly well but still having shortness of breath.  She does have a pericardial effusion which will need further address but currently no evidence of symptom tamponade ? ?Echocardiogram showing normal LV systolic function with no evidence of valvular heart disease or other significant issues and no evidence of tamponade but the patient does have moderate circumferential pericardial effusion likely secondary to cancer ? ?Review of Systems: ?Positive for: Short of breath ?Negative for: Vision change, hearing change, syncope, dizziness, nausea, vomiting,diarrhea, bloody stool, stomach pain, cough, congestion, diaphoresis, urinary frequency, urinary pain,skin lesions, skin rashes ?Others previously listed ? ?Objective: ?Telemetry: Normal sinus rhythm ?Physical Exam: Blood pressure 96/85, pulse 99, temperature 98 ?F (36.7 ?C), temperature source Oral, resp. rate 16, height 4\' 10"  (1.473 m), weight 40.5 kg, SpO2 94 %. Body mass index is 18.64 kg/m?. ?General: Well developed, well nourished, in no acute distress. ?Head: Normocephalic, atraumatic, sclera non-icteric, no xanthomas, nares are without discharge. ?Neck: No apparent masses ?Lungs: Normal respirations with some wheezes, no rhonchi, no rales , basilar crackles decreased breath sounds of right base ? Heart: Regular rate and rhythm, normal S1 S2, no murmur, no rub, no gallop, PMI is normal size and placement, carotid upstroke normal without bruit, jugular venous pressure normal ?Abdomen: Soft, non-tender, non-distended with normoactive bowel sounds. No hepatosplenomegaly. Abdominal aorta is normal size without bruit ?Extremities: No edema, no clubbing, no cyanosis, no ulcers,  ?Peripheral: 2+ radial, 2+ femoral, 2+ dorsal pedal pulses ?Neuro: Alert and oriented. Moves  all extremities spontaneously. ?Psych:  Responds to questions appropriately with a normal affect. ? ? ?Intake/Output Summary (Last 24 hours) at 11/28/2021 1658 ?Last data filed at 11/28/2021 0900 ?Gross per 24 hour  ?Intake 440 ml  ?Output --  ?Net 440 ml  ? ? ?Inpatient Medications:  ? acetaminophen  1,000 mg Oral Q8H  ? Or  ? acetaminophen  650 mg Rectal Q8H  ? azithromycin  250 mg Oral Daily  ? cyclobenzaprine  10 mg Oral BID  ? feeding supplement  237 mL Oral TID BM  ? guaiFENesin  600 mg Oral BID  ? iohexol  500 mL Oral Q1H  ? latanoprost  1 drop Both Eyes QHS  ? lidocaine  1 patch Transdermal Q24H  ? methocarbamol  500 mg Oral Q8H  ? midodrine  5 mg Oral BID WC  ? multivitamin with minerals  1 tablet Oral Daily  ? oxybutynin  5 mg Oral BID  ? oxyCODONE  10 mg Oral Q12H  ? polyethylene glycol  17 g Oral Daily  ? predniSONE  40 mg Oral Q breakfast  ? Ensure Max Protein  11 oz Oral BID  ? senna-docusate  1 tablet Oral BID  ? sertraline  50 mg Oral Daily  ? traMADol  50-100 mg Oral Daily  ? ?Infusions:  ? cefTRIAXone (ROCEPHIN)  IV 2 g (11/28/21 1227)  ? methocarbamol (ROBAXIN) IV    ? ? ?Labs: ?Recent Labs  ?  11/28/21 ?0637  ?NA 136  ?K 4.5  ?CL 99  ?CO2 29  ?GLUCOSE 131*  ?BUN 19  ?CREATININE 0.61  ?CALCIUM 9.0  ? ?No results for input(s): AST, ALT, ALKPHOS, BILITOT, PROT, ALBUMIN in the last 72 hours. ?Recent Labs  ?  11/26/21 ?2301 11/28/21 ?0637  ?WBC 10.1 13.3*  ?HGB 13.1 12.4  ?HCT 40.7  38.2  ?MCV 90.6 91.0  ?PLT 515* 565*  ? ?No results for input(s): CKTOTAL, CKMB, TROPONINI in the last 72 hours. ?Invalid input(s): POCBNP ?No results for input(s): HGBA1C in the last 72 hours.  ? ?Weights: ?Filed Weights  ? 11/22/21 1837 11/22/21 2132  ?Weight: 38.6 kg 40.5 kg  ? ? ? ?Radiology/Studies:  ?DG Chest 2 View ? ?Result Date: 11/20/2021 ?CLINICAL DATA:  Possible sepsis with shortness of breath and cough, initial encounter EXAM: CHEST - 2 VIEW COMPARISON:  None Available. FINDINGS: Cardiac shadow is at the upper  limits of normal in size. Lungs are hyperinflated. Right middle lobe pneumonia is seen with associated pleural effusion. No other focal abnormality is seen. IMPRESSION: Right middle lobe pneumonia with associated right-sided effusion. Electronically Signed   By: Inez Catalina M.D.   On: 11/20/2021 19:59  ? ?CT Angio Chest Pulmonary Embolism (PE) W or WO Contrast ? ?Result Date: 11/26/2021 ?CLINICAL DATA:  Chest pain, shortness of breath, hypoxia EXAM: CT ANGIOGRAPHY CHEST WITH CONTRAST TECHNIQUE: Multidetector CT imaging of the chest was performed using the standard protocol during bolus administration of intravenous contrast. Multiplanar CT image reconstructions and MIPs were obtained to evaluate the vascular anatomy. RADIATION DOSE REDUCTION: This exam was performed according to the departmental dose-optimization program which includes automated exposure control, adjustment of the mA and/or kV according to patient size and/or use of iterative reconstruction technique. CONTRAST:  46mL OMNIPAQUE IOHEXOL 350 MG/ML SOLN COMPARISON:  Chest radiograph done on 11/22/2021 FINDINGS: Cardiovascular: Moderate to large pericardial effusion is seen. There is homogeneous enhancement in thoracic aorta. There are no intraluminal filling defects in the pulmonary artery branches. Mediastinum/Nodes: Soft tissue density seen in the subcarinal region of mediastinum may be part of pericardial effusion or suggest enlarged lymph nodes. There are slightly enlarged lymph nodes in both hilar regions. Lungs/Pleura: Centrilobular emphysema is seen. Increased interstitial markings are seen in both lungs, more so on the right side. There are patchy infiltrates in the parahilar regions and lower lung fields suggesting atelectasis/pneumonia. Small bilateral pleural effusions are seen, more so on the right side. Right chest tube is noted in place. There is no pneumothorax. Upper Abdomen: Colon is interposed between liver and anterior abdominal wall.  Stomach appears to be distended with fluid. Musculoskeletal: There is mild decrease in height of upper endplates of bodies of T7 and T9 vertebrae possibly mild old compression fractures. Alignment of posterior margins of vertebral bodies is unremarkable. Review of the MIP images confirms the above findings. IMPRESSION: There is no evidence of pulmonary artery embolism. There is no evidence of thoracic aortic dissection. Moderate to large pericardial effusion. Small bilateral pleural effusions. Infiltrates are seen in the both lungs, more so on the right side suggesting atelectasis/pneumonia. Centrilobular emphysema. Increased interstitial markings are seen in both lungs, more so on the right side suggesting possible interstitial pneumonia or asymmetric interstitial edema. Other findings as described in the body of the report. Electronically Signed   By: Elmer Picker M.D.   On: 11/26/2021 18:30  ? ?DG Chest Port 1 View ? ?Result Date: 11/22/2021 ?CLINICAL DATA:  Questionable sepsis - evaluate for abnormality Shortness of breath. EXAM: PORTABLE CHEST 1 VIEW COMPARISON:  Chest radiograph yesterday and 11/20/2021, patient underwent recent thoracentesis. FINDINGS: Right pleural effusion is increased from radiograph yesterday. Associated increased opacity in the right lower hemithorax may be related to layering pleural effusion or atelectasis/airspace disease. Small left pleural effusion is similar. Stable heart size and mediastinal contours. No pneumothorax. IMPRESSION:  1. Increased right pleural effusion from radiograph yesterday. Associated increased opacity in the right lower hemithorax may be related to layering pleural fluid or atelectasis/airspace disease. 2. Unchanged small left pleural effusion. Electronically Signed   By: Keith Rake M.D.   On: 11/22/2021 18:59  ? ?DG Chest Port 1 View ? ?Result Date: 11/21/2021 ?CLINICAL DATA:  Status post right pleural effusion. EXAM: PORTABLE CHEST 1 VIEW COMPARISON:   Chest x-ray from yesterday. FINDINGS: Stable cardiomediastinal silhouette. Decreased now trace right pleural effusion status post thoracentesis. Somewhat improved aeration at the right lung base with re

## 2021-11-28 NOTE — Progress Notes (Signed)
Per MD, R. Wieting, start every other day draining of plurex. Drained 11/27/2021, due next 11/29/2021. ?

## 2021-11-28 NOTE — Progress Notes (Signed)
?Progress Note ? ? ?Patient: Carly Abbott [id number]:[id number] DOB: Nov 02, 1955 DOA: 11/22/2021     5 ?DOS: the patient was seen and examined on 11/28/2021 ?  ? ? ?Assessment and Plan: ?* Metastatic adenocarcinoma ([place]) ?Pleural fluid came back metastatic adenocarcinoma likely lung origin.  CT scan did not show a lung mass. Dr. Tasia Catchings ordered a CT scan of the abdomen.  Appreciate consultation with Dr. Tasia Catchings. Pathology required further cytology to be sent to the lab for immunochemical studies.  CT scan of the chest showing large pericardial effusion.  Echocardiogram shows moderate pericardial effusion without evidence of tamponade. ? ?Pleural effusion on right ?Patient underwent thoracentesis on 5/5 with 1.2 L fluid removed.  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 ?--IR placed a Pleurx drainage catheter on 5/8 ?--Oncology follow-up ?--Oral oxycodone 10 mg as needed for pain ?-- OxyContin 10 mg twice a day ? ?Pericardial effusion ?Echocardiogram shows moderate pericardial effusion without evidence of tamponade. ? ?Acute respiratory failure with hypoxia ([place]) ?This morning patient on 4 L of oxygen.  On admission on 6 L had a pulse ox of 90%.  Try to taper oxygen as much as possible.  Asked nursing staff to ambulate patient and check pulse ox with ambulation. ? ?Protein-calorie malnutrition, severe ?related to chronic illness  ? ?Weight loss, unintentional ?Likely secondary to malignant process and not eating well. ?Body mass index is 18.64 kg/m?. ? ? ?Hypokalemia ?Replaced insulin normal range as of 11/25/2021 ? ?Hypotension ?Hypotension and tachycardia on low-dose midodrine. ? ?Mid back pain ?Lidocaine patch ? ?SIRS (systemic inflammatory response syndrome) (HCC) ?CT scan showing pneumonia.  Continue Rocephin and Zithromax. ? ? ?Hypoalbuminemia due to protein-calorie malnutrition ([place]) ?Dietitian consulted ? ?Anxiety and depression ?--Continue home duloxetine ?--Ativan 0.5 mg IV qhs PRN ? ?MS (multiple sclerosis) ([place]) ?Continue  home Flexeril 10 mg p.o. twice daily ? ? ? ? ?  ? ?Subjective: Patient more comfortable with adding long-acting pain medications.  When I saw her she was coughing less than the previous few days.  Still having quite a bit of pain with drainage from the pleural catheter. ? ?Physical Exam: ?Vitals:  ? 11/27/21 1652 11/27/21 2025 11/28/21 0615 11/28/21 0825  ?BP: 105/69 121/74 109/76 96/85  ?Pulse: 98 (!) 103 100 99  ?Resp:  18  16  ?Temp:  98.2 ?F (36.8 ?C) 98 ?F (36.7 ?C) 98 ?F (36.7 ?C)  ?TempSrc:  Oral Oral Oral  ?SpO2: 95% 94%  94%  ?Weight:      ?Height:      ? ?Physical Exam ?HENT:  ?   Head: Normocephalic.  ?   Mouth/Throat:  ?   Pharynx: No oropharyngeal exudate.  ?Eyes:  ?   General: Lids are normal.  ?   Conjunctiva/sclera: Conjunctivae normal.  ?Cardiovascular:  ?   Rate and Rhythm: Regular rhythm. Tachycardia present.  ?   Heart sounds: Normal heart sounds, S1 normal and S2 normal.  ?Pulmonary:  ?   Breath sounds: Examination of the right-middle field reveals decreased breath sounds. Examination of the right-lower field reveals decreased breath sounds and rhonchi. Examination of the left-lower field reveals decreased breath sounds. Decreased breath sounds and rhonchi present. No wheezing or rales.  ?Abdominal:  ?   Palpations: Abdomen is soft.  ?   Tenderness: There is no abdominal tenderness.  ?Musculoskeletal:  ?   Right lower leg: No swelling.  ?   Left lower leg: No swelling.  ?Skin: ?   General: Skin is warm.  ?  Findings: No rash.  ?Neurological:  ?   Mental Status: She is alert and oriented to person, place, and time.  ?  ?Data Reviewed: ?White blood cell count 13.3, platelet count 565, hemoglobin 12.4 ? ?Family Communication: Spoke with brother at the bedside ? ?Disposition: ?Status is: Inpatient ?Remains inpatient appropriate because: Need to make sure that she can hold her saturations with ambulation.  Still trying to control pain.  Oncology ordered a CT scan of the abdomen pelvis. ? Planned  Discharge Destination: Home with Home Health ? ? ?Author: ?Loletha Grayer, MD ?11/28/2021 2:43 PM ? ?For on call review [email or web address]  ?

## 2021-11-28 NOTE — Progress Notes (Signed)
? ?Hematology/Oncology Progress note ?[phone] [phone] ?  ? ? ?Patient Care Team: ?Dion Body, MD as PCP - General (Family Medicine) ?Telford Nab, RN as Sales executive  ? ?Name of the patient: Carly Abbott  ?[id number]  ?[birth year]  ?Date of visit: 11/28/21 ? ? ?INTERVAL HISTORY-  ? ?Patient feels that shortness of breath is slightly better.  On 4 L off nasal cannula oxygen.  Brother at bedside. ? ?Patient Active Problem List  ? Diagnosis Date Noted  ? Pericardial effusion 11/27/2021  ? Metastatic adenocarcinoma ([place]) 11/26/2021  ? Acute respiratory failure with hypoxia ([place]) 11/26/2021  ? Weight loss, unintentional 11/24/2021  ? Protein-calorie malnutrition, severe 11/24/2021  ? Hypotension 11/23/2021  ? Hypokalemia 11/23/2021  ? Recurrent right pleural effusion 11/23/2021  ? SIRS (systemic inflammatory response syndrome) ([place]) 11/22/2021  ? Mid back pain 11/22/2021  ? Hypoalbuminemia due to protein-calorie malnutrition ([place])   ? Community acquired pneumonia 11/20/2021  ? Anxiety and depression 11/20/2021  ? Pleural effusion on right 11/20/2021  ? MS (multiple sclerosis) ([place]) 09/30/2021  ? ? ?Current Facility-Administered Medications:  ?  acetaminophen (TYLENOL) tablet 1,000 mg, 1,000 mg, Oral, Q8H, 1,000 mg at 11/28/21 1221 **OR** acetaminophen (TYLENOL) suppository 650 mg, 650 mg, Rectal, Q8H, Griffith, Kelly A, DO ?  [COMPLETED] azithromycin (ZITHROMAX) tablet 500 mg, 500 mg, Oral, Daily, 500 mg at 11/27/21 0955 **FOLLOWED BY** azithromycin (ZITHROMAX) tablet 250 mg, 250 mg, Oral, Daily, Leslye Peer, Richard, MD, 250 mg at 11/28/21 0956 ?  cefTRIAXone (ROCEPHIN) 2 g in sodium chloride 0.9 % 100 mL IVPB, 2 g, Intravenous, Q24H, Wieting, Richard, MD, Last Rate: 200 mL/hr at 11/28/21 1227, 2 g at 11/28/21 1227 ?  cyclobenzaprine (FLEXERIL) tablet 10 mg, 10 mg, Oral, BID, Cox, Amy N, DO, 10 mg at 11/28/21 0956 ?  diazepam (VALIUM) tablet 10 mg, 10 mg, Oral, Q8H PRN, Loletha Grayer, MD, 10 mg at 11/28/21 0957 ?  feeding supplement (ENSURE ENLIVE / ENSURE PLUS) liquid 237 mL, 237 mL, Oral, TID BM, Nicole Kindred A, DO, 237 mL at 11/28/21 1220 ?  guaiFENesin (MUCINEX) 12 hr tablet 600 mg, 600 mg, Oral, BID, Cox, Amy N, DO, 600 mg at 11/28/21 0957 ?  latanoprost (XALATAN) 0.005 % ophthalmic solution 1 drop, 1 drop, Both Eyes, QHS, Cox, Amy N, DO, 1 drop at 11/27/21 2019 ?  lidocaine (LIDODERM) 5 % 1 patch, 1 patch, Transdermal, Q24H, Cox, Amy N, DO, 1 patch at 11/27/21 2019 ?  lidocaine (XYLOCAINE) 1 % (with pres) injection, , , PRN, El-Abd, Joesph Fillers, MD, 15 mL at 11/24/21 1705 ?  methocarbamol (ROBAXIN) tablet 500 mg, 500 mg, Oral, Q8H, 500 mg at 11/28/21 1221 **OR** methocarbamol (ROBAXIN) 500 mg in dextrose 5 % 50 mL IVPB, 500 mg, Intravenous, Q8H, Darrick Penna, RPH ?  midodrine (PROAMATINE) tablet 5 mg, 5 mg, Oral, BID WC, Nicole Kindred A, DO, 5 mg at 11/28/21 0956 ?  morphine (PF) 2 MG/ML injection 1-2 mg, 1-2 mg, Intravenous, Q3H PRN, Nicole Kindred A, DO, 2 mg at 11/28/21 1761 ?  multivitamin with minerals tablet 1 tablet, 1 tablet, Oral, Daily, Cox, Amy N, DO, 1 tablet at 11/28/21 0956 ?  oxybutynin (DITROPAN) tablet 5 mg, 5 mg, Oral, BID, Leslye Peer, Richard, MD, 5 mg at 11/28/21 0957 ?  oxyCODONE (Oxy IR/ROXICODONE) immediate release tablet 10 mg, 10 mg, Oral, Q4H PRN, Loletha Grayer, MD, 10 mg at 11/28/21 1221 ?  oxyCODONE (OXYCONTIN) 12 hr tablet 10 mg, 10 mg, Oral, Q12H, Wieting,  Richard, MD, 10 mg at 11/28/21 0956 ?  polyethylene glycol (MIRALAX / GLYCOLAX) packet 17 g, 17 g, Oral, Daily, Nicole Kindred A, DO, 17 g at 11/28/21 1002 ?  predniSONE (DELTASONE) tablet 40 mg, 40 mg, Oral, Q breakfast, Sharion Settler, NP, 40 mg at 11/28/21 0956 ?  protein supplement (ENSURE MAX) liquid, 11 oz, Oral, BID, Sharion Settler, NP, 11 oz at 11/27/21 2004 ?  senna-docusate (Senokot-S) tablet 1 tablet, 1 tablet, Oral, BID, Ezekiel Slocumb, DO, 1 tablet at 11/28/21 6294 ?   sertraline (ZOLOFT) tablet 50 mg, 50 mg, Oral, Daily, Leslye Peer, Richard, MD, 50 mg at 11/28/21 0956 ?  traMADol (ULTRAM) tablet 50-100 mg, 50-100 mg, Oral, Daily, Cox, Amy N, DO, 100 mg at 11/28/21 0956 ? ? ?Physical exam:  ?Vitals:  ? 11/27/21 1652 11/27/21 2025 11/28/21 0615 11/28/21 0825  ?BP: 105/69 121/74 109/76 96/85  ?Pulse: 98 (!) 103 100 99  ?Resp:  18  16  ?Temp:  98.2 ?F (36.8 ?C) 98 ?F (36.7 ?C) 98 ?F (36.7 ?C)  ?TempSrc:  Oral Oral Oral  ?SpO2: 95% 94%  94%  ?Weight:      ?Height:      ? ?Physical Exam ?Constitutional:   ?   General: She is not in acute distress. ?   Appearance: She is not diaphoretic.  ?HENT:  ?   Head: Normocephalic and atraumatic.  ?   Nose: Nose normal.  ?   Mouth/Throat:  ?   Pharynx: No oropharyngeal exudate.  ?Eyes:  ?   General: No scleral icterus. ?   Pupils: Pupils are equal, round, and reactive to light.  ?Cardiovascular:  ?   Rate and Rhythm: Normal rate and regular rhythm.  ?   Heart sounds: No murmur heard. ?Pulmonary:  ?   Effort: Pulmonary effort is normal. No respiratory distress.  ?   Comments: Decreased breath sound bilaterally, on nasal cannula oxygen 4 L. ?Abdominal:  ?   General: There is no distension.  ?   Palpations: Abdomen is soft.  ?Musculoskeletal:     ?   General: Normal range of motion.  ?   Cervical back: Normal range of motion and neck supple.  ?Skin: ?   General: Skin is warm and dry.  ?   Findings: No erythema.  ?Neurological:  ?   Mental Status: She is alert and oriented to person, place, and time.  ?   Cranial Nerves: No cranial nerve deficit.  ?   Motor: No abnormal muscle tone.  ?   Coordination: Coordination normal.  ?Psychiatric:     ?   Mood and Affect: Affect normal.  ?  ? ? ? ? ?  Latest Ref Rng & Units 11/28/2021  ?  6:37 AM  ?CMP  ?Glucose 70 - 99 mg/dL 131    ?BUN 8 - 23 mg/dL 19    ?Creatinine 0.44 - 1.00 mg/dL 0.61    ?Sodium 135 - 145 mmol/L 136    ?Potassium 3.5 - 5.1 mmol/L 4.5    ?Chloride 98 - 111 mmol/L 99    ?CO2 22 - 32 mmol/L 29     ?Calcium 8.9 - 10.3 mg/dL 9.0    ? ? ?  Latest Ref Rng & Units 11/28/2021  ?  6:37 AM  ?CBC  ?WBC 4.0 - 10.5 K/uL 13.3    ?Hemoglobin 12.0 - 15.0 g/dL 12.4    ?Hematocrit 36.0 - 46.0 % 38.2    ?Platelets 150 - 400 K/uL 565    ? ? ?  RADIOGRAPHIC STUDIES: ?I have personally reviewed the radiological images as listed and agreed with the findings in the report. ?DG Chest 2 View ? ?Result Date: 11/20/2021 ?CLINICAL DATA:  Possible sepsis with shortness of breath and cough, initial encounter EXAM: CHEST - 2 VIEW COMPARISON:  None Available. FINDINGS: Cardiac shadow is at the upper limits of normal in size. Lungs are hyperinflated. Right middle lobe pneumonia is seen with associated pleural effusion. No other focal abnormality is seen. IMPRESSION: Right middle lobe pneumonia with associated right-sided effusion. Electronically Signed   By: Inez Catalina M.D.   On: 11/20/2021 19:59  ? ?CT Angio Chest Pulmonary Embolism (PE) W or WO Contrast ? ?Result Date: 11/26/2021 ?CLINICAL DATA:  Chest pain, shortness of breath, hypoxia EXAM: CT ANGIOGRAPHY CHEST WITH CONTRAST TECHNIQUE: Multidetector CT imaging of the chest was performed using the standard protocol during bolus administration of intravenous contrast. Multiplanar CT image reconstructions and MIPs were obtained to evaluate the vascular anatomy. RADIATION DOSE REDUCTION: This exam was performed according to the departmental dose-optimization program which includes automated exposure control, adjustment of the mA and/or kV according to patient size and/or use of iterative reconstruction technique. CONTRAST:  23mL OMNIPAQUE IOHEXOL 350 MG/ML SOLN COMPARISON:  Chest radiograph done on 11/22/2021 FINDINGS: Cardiovascular: Moderate to large pericardial effusion is seen. There is homogeneous enhancement in thoracic aorta. There are no intraluminal filling defects in the pulmonary artery branches. Mediastinum/Nodes: Soft tissue density seen in the subcarinal region of mediastinum may  be part of pericardial effusion or suggest enlarged lymph nodes. There are slightly enlarged lymph nodes in both hilar regions. Lungs/Pleura: Centrilobular emphysema is seen. Increased interstitial mark

## 2021-11-28 NOTE — Progress Notes (Signed)
?   11/28/21 0930  ?Clinical Encounter Type  ?Visited With Patient and family together  ?Visit Type Follow-up  ? ?Chaplain facilitated completion of Advance Directive ?

## 2021-11-29 DIAGNOSIS — M549 Dorsalgia, unspecified: Secondary | ICD-10-CM

## 2021-11-29 DIAGNOSIS — J90 Pleural effusion, not elsewhere classified: Secondary | ICD-10-CM | POA: Diagnosis not present

## 2021-11-29 DIAGNOSIS — F32A Depression, unspecified: Secondary | ICD-10-CM

## 2021-11-29 DIAGNOSIS — F419 Anxiety disorder, unspecified: Secondary | ICD-10-CM

## 2021-11-29 DIAGNOSIS — J9601 Acute respiratory failure with hypoxia: Secondary | ICD-10-CM | POA: Diagnosis not present

## 2021-11-29 DIAGNOSIS — C799 Secondary malignant neoplasm of unspecified site: Secondary | ICD-10-CM | POA: Diagnosis not present

## 2021-11-29 DIAGNOSIS — I3139 Other pericardial effusion (noninflammatory): Secondary | ICD-10-CM | POA: Diagnosis not present

## 2021-11-29 LAB — ANTINUCLEAR ANTIBODIES, IFA: ANA Ab, IFA: NEGATIVE

## 2021-11-29 MED ORDER — LACTULOSE 10 GM/15ML PO SOLN: 30.0000 g | Freq: Every day | ORAL | 0 refills | Status: AC | PRN

## 2021-11-29 MED ORDER — SENNOSIDES-DOCUSATE SODIUM 8.6-50 MG PO TABS: 1.0000 | ORAL_TABLET | Freq: Two times a day (BID) | ORAL | Status: AC

## 2021-11-29 MED ORDER — SERTRALINE HCL 50 MG PO TABS: 50.0000 mg | ORAL_TABLET | Freq: Every day | ORAL | Status: AC

## 2021-11-29 MED ORDER — MORPHINE SULFATE (PF) 2 MG/ML IV SOLN: 1.0000 mg | INTRAVENOUS | 0 refills | Status: AC | PRN

## 2021-11-29 MED ORDER — METHOCARBAMOL 500 MG PO TABS
500.0000 mg | ORAL_TABLET | Freq: Three times a day (TID) | ORAL | Status: AC
Start: 2021-11-29 — End: ?

## 2021-11-29 MED ORDER — OXYCODONE HCL ER 10 MG PO T12A: 10.0000 mg | EXTENDED_RELEASE_TABLET | Freq: Two times a day (BID) | ORAL | 0 refills | Status: AC

## 2021-11-29 MED ORDER — DIAZEPAM 10 MG PO TABS: 10.0000 mg | ORAL_TABLET | Freq: Three times a day (TID) | ORAL | 0 refills | Status: AC | PRN

## 2021-11-29 MED ORDER — OXYCODONE HCL 10 MG PO TABS: 10.0000 mg | ORAL_TABLET | ORAL | 0 refills | Status: AC | PRN

## 2021-11-29 MED ORDER — ENSURE ENLIVE PO LIQD: 237.0000 mL | Freq: Three times a day (TID) | ORAL | 12 refills | Status: AC

## 2021-11-29 MED ORDER — POLYETHYLENE GLYCOL 3350 17 G PO PACK
17.0000 g | PACK | Freq: Two times a day (BID) | ORAL | Status: DC
  Administered 2021-11-29: 17 g via ORAL
  Filled 2021-11-29: qty 1

## 2021-11-29 MED ORDER — ONDANSETRON HCL 4 MG/2ML IJ SOLN
4.0000 mg | Freq: Four times a day (QID) | INTRAMUSCULAR | Status: DC | PRN
  Administered 2021-11-29: 4 mg via INTRAVENOUS
  Filled 2021-11-29: qty 2

## 2021-11-29 MED ORDER — LACTULOSE 10 GM/15ML PO SOLN: 30.0000 g | Freq: Every day | ORAL | Status: DC | PRN

## 2021-11-29 MED ORDER — OXYBUTYNIN CHLORIDE 5 MG PO TABS
5.0000 mg | ORAL_TABLET | Freq: Two times a day (BID) | ORAL | 0 refills | Status: AC
Start: 2021-11-29 — End: ?

## 2021-11-29 MED ORDER — MIDODRINE HCL 5 MG PO TABS: 5.0000 mg | ORAL_TABLET | Freq: Two times a day (BID) | ORAL | Status: AC

## 2021-11-29 MED ORDER — LIDOCAINE 5 % EX PTCH: 1.0000 | MEDICATED_PATCH | CUTANEOUS | 0 refills | Status: AC

## 2021-11-29 MED ORDER — GUAIFENESIN ER 600 MG PO TB12
600.0000 mg | ORAL_TABLET | Freq: Two times a day (BID) | ORAL | Status: AC
Start: 2021-11-29 — End: ?

## 2021-11-29 MED ORDER — ACETAMINOPHEN 500 MG PO TABS: 1000.0000 mg | ORAL_TABLET | Freq: Three times a day (TID) | ORAL | 0 refills | Status: AC

## 2021-11-29 MED ORDER — POLYETHYLENE GLYCOL 3350 17 G PO PACK: 17.0000 g | PACK | Freq: Two times a day (BID) | ORAL | 0 refills | Status: AC

## 2021-11-29 MED ORDER — ENSURE MAX PROTEIN PO LIQD: 11.0000 [oz_av] | Freq: Two times a day (BID) | ORAL | Status: AC

## 2021-11-29 MED ORDER — AZITHROMYCIN 250 MG PO TABS: 250.0000 mg | ORAL_TABLET | Freq: Every day | ORAL | 0 refills | Status: AC

## 2021-11-29 MED ORDER — PREDNISONE 20 MG PO TABS
40.0000 mg | ORAL_TABLET | Freq: Every day | ORAL | 0 refills | Status: AC
Start: 2021-11-30 — End: 2021-12-02

## 2021-11-29 MED ORDER — SODIUM CHLORIDE 0.9 % IV SOLN: 2.0000 g | INTRAVENOUS | Status: AC

## 2021-11-29 NOTE — Discharge Summary (Addendum)
?Physician Discharge Summary ?  ?Patient: Carly Abbott MRN: [id number] DOB: Nov 13, 1955  ?Admit date:     11/22/2021  ?Discharge date: 11/29/21  ?Discharge Physician: Loletha Grayer  ? ?PCP: Dion Body, MD  ? ?Recommendations at discharge:  ? ?Follow-up with medical team at [facility] 1 day.  Cardiothoracic surgery consultation to be obtained once at [facility].  Case discussed with Dr. Darcey Nora. ? ?Discharge Diagnoses: ?Principal Problem: ?  Pericardial effusion ?Active Problems: ?  Pleural effusion on right ?  Metastatic adenocarcinoma ([place]) ?  Acute on chronic respiratory failure with hypoxia (HCC) ?  MS (multiple sclerosis) ([place]) ?  Multifocal pneumonia ?  Anxiety and depression ?  Hypoalbuminemia due to protein-calorie malnutrition ([place]) ?  SIRS (systemic inflammatory response syndrome) (H[facility]) ?  Mid back pain ?  Hypotension ?  Hypokalemia ?  Recurrent right pleural effusion ?  Weight loss, unintentional ?  Protein-calorie malnutrition, severe ? ? ? ?Hospital Course: ?Ms. 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  She was reportedly too short of breath to make it into her home, and returned to the ED. ? ?Her O2 requirement increased from 2 >> 4 L/min, later up to 8 L/min, with tachypnea and mild tachycardia. ?Chest xray showed some re-accumulation of the R pleural effusion since the thoracentesis on 5/5. ? ?Admitted to hospitalist service.  IR consulted for placement of PleurX pigtail catheter, as patient prefers not to have to undergo frequent serial  paracenteses. ? ?5/7: requiring low dose morphine to control dyspnea. ? ?5/8: Pleurx catheter placed by interventional radiology. ? 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  Dr. Tasia Catchings was reconsulted. ? ?11/26/2021 CT scan of the chest showed no evidence of pulmonary embolism or thoracic dissection.  Showed a moderate to large pericardial effusion and small bilateral pleural effusions.  Infiltrate seen in both lungs and COPD seen.  Patient was started on Rocephin and Zithromax on 11/27/2021. ? ?Echocardiogram resulted on 11/28/2021 showed a moderate pericardial effusion with normal ejection fraction and no signs of pericardial tamponade.   ? ?Cardiology on the evening of 5/12 recommended cardiothoracic surgery consultation for pericardial window to reduce the amount of pericardial effusion and reduce the potential risk of pericardial tamponade.  Case discussed with covering oncology on 11/29/2021 and recommended speaking with cardiothoracic surgery.  I spoke with cardio thoracic surgery Dr. Prescott Gum who reviewed viewed the patient's studies and recommended transfer for pericardial window. ? ?CT scan of the abdomen and pelvis resulted on the morning of 11/29/2021 showed extremely large burden of stool and stool balls within the cecum.  Stomach is distended and there is transit of oral contrast into the small bowel.  Could have incomplete gastric outlet obstruction.  Small bilateral effusions.  Masslike consolidation infrahilar right lung and right middle lobe. ? ?The patient does not having any nausea or vomiting but not eating very much.  Does complain of constipation so I will get more aggressive with anticonstipation medications. ? ?Patient does have a lot of pain with fluid removal from Pleurx catheter.  Had to start  OxyContin and oxycodone to control pain.  As needed IV morphine. ? ? ?Assessment and Plan: ?* Pericardial effusion ?Echocardiogram  shows moderate pericardial effusion without evidence of tamponade.  Cardiology recommended cardiothoracic consultation for pericardial window.  Case discussed with Dr. Darcey Nora and he will see the patient in consultation when the patient is transferred over to [facility]. ? ?Metastatic adenocarcinoma ([place]) ?Pleural fluid came back metastatic adenocarcinoma Likely lung origin.  CT scan of the abdomen did show a mass in the lung. ? ?Pleural effusion on right ?-patient underwent thoracentesis on 5/5 with 1.2 L fluid removed.  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 ?--IR placed a Pleurx drainage catheter on 5/8 ?--Oncology follow-up ?--Oral oxycodone 10 mg as needed for pain ?--OxyContin 10 mg twice a day ? ?Acute on chronic respiratory failure with hypoxia (HCC) ?This morning patient on 4 L of oxygen.  On admission on 6 L had a pulse ox of 90%.  The patient was hypoxic on room air and is a candidate for home oxygen.  She held her sats walking around the hallway on 4 L today. ? ?Protein-calorie malnutrition, severe ?related to chronic illness  ? ?Weight loss, unintentional ?Likely secondary to malignant process and not eating well. ?Body mass index is 18.64 kg/m?. ? ? ?Hypokalemia ?Replaced insulin normal range as of 11/28/2021 ? ?Hypotension ?Hypotension and tachycardia on low-dose midodrine. ? ?Mid back pain ?Lidocaine patch ? ?SIRS (systemic inflammatory response syndrome) (HCC) ?CT scan showing pneumonia.  Continue 5 days of Rocephin and Zithromax started on 11/26/2021 ? ? ?Hypoalbuminemia due to protein-calorie malnutrition ([place]) ?Dietitian consulted ? ?Anxiety and depression ?Continue Zoloft and Valium ? ?MS (multiple sclerosis) ([place]) ?Continue home Flexeril 10 mg p.o. twice daily ? ? ? ? ?  ? ? ?Consultants: Oncology, interventional radiology, cardiology ?Procedures performed: Pleurx catheter ?Disposition: Transfer to [facility] for pericardial window when bed available ?Diet  recommendation:  ?Regular ?DISCHARGE MEDICATION: ?Allergies as of 11/29/2021   ? ?   Reactions  ? Cardizem [diltiazem Hcl] Itching  ? Other Hives  ? TECFREDA   ? ?  ? ?  ?Medication List  ?  ? ?STOP taking these medications   ? ?amLODipine 5 MG tablet ?Commonly known as: NORVASC ?  ?DULoxetine 30 MG capsule ?Commonly known as: CYMBALTA ?  ?traMADol 50 MG tablet ?Commonly known as: ULTRAM ?  ? ?  ? ?TAKE these medications   ? ?acetaminophen 500 MG tablet ?Commonly known as: TYLENOL ?Take 2 tablets (1,000 mg total) by mouth every 8 (eight) hours. ?What changed:  ?how much to take ?when to take this ?reasons to take this ?  ?azithromycin 250 MG tablet ?Commonly known as: ZITHROMAX ?Take 1 tablet (250 mg total) by mouth daily for 2 days. ?Start taking on: Nov 30, 2021 ?  ?cefTRIAXone 2 g in sodium chloride 0.9 % 100 mL ?Inject 2 g into the vein daily for 2 days. ?  ?cyclobenzaprine 10 MG tablet ?Commonly known as: FLEXERIL ?Take 10 mg by mouth 2 (two) times daily. ?  ?diazepam 10 MG tablet ?Commonly known as: VALIUM ?Take 1 tablet (10 mg total) by mouth every 8 (eight) hours as needed for anxiety. ?  ?feeding supplement Liqd ?Take 237 mLs by mouth 3 (three) times daily between meals. ?  ?Ensure Max Protein Liqd ?Take 330 mLs (11 oz total) by mouth 2 (two) times daily. ?  ?guaiFENesin 600 MG 12 hr tablet ?Commonly known as: [place] ?Take 1 tablet (600 mg total) by mouth 2 (  two) times daily. ?  ?lactulose 10 GM/15ML solution ?Commonly known as: [place] ?Take 45 mLs (30 g total) by mouth daily as needed for mild constipation or severe constipation. ?  ?latanoprost 0.005 % ophthalmic solution ?Commonly known as: XALATAN ?Place 1 drop into both eyes at bedtime. ?  ?lidocaine 5 % ?Commonly known as: LIDODERM ?Place 1 patch onto the skin daily. Remove & Discard patch within 12 hours or as directed by MD ?  ?methocarbamol 500 MG tablet ?Commonly known as: ROBAXIN ?Take 1 tablet (500 mg total) by mouth every 8 (eight) hours. ?   ?midodrine 5 MG tablet ?Commonly known as: PROAMATINE ?Take 1 tablet (5 mg total) by mouth 2 (two) times daily with a meal. ?  ?morphine (PF) 2 MG/ML injection ?Inject 0.5-1 mLs (1-2 mg total) into the vein every 3 (three

## 2021-11-29 NOTE — Progress Notes (Signed)
Pt requiring max assist to transfer to [facility]. Unable to stand fully upright and take steps due to extreme weakness.Did not ambulate in hallways due to inability to walk.  ?

## 2021-11-29 NOTE — Plan of Care (Signed)
Pt has been assigned a bed at [facility].  Called report to [facility], Therapist, sports.  Receiving Dr Doristine Bosworth.  ?

## 2021-11-29 NOTE — Progress Notes (Signed)
?Progress Note ? ? ?Patient: Carly Abbott DGU:[id number] DOB: [birth year] DOA: 11/22/2021     6 ?DOS: the patient was seen and examined on 11/29/2021 ?  ?Brief hospital course: ?Ms. 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  She was reportedly too short of breath to make it into her home, and returned to the ED. ? ?Her O2 requirement increased from 2 >> 4 L/min, later up to 8 L/min, with tachypnea and mild tachycardia. ?Chest xray showed some re-accumulation of the R pleural effusion since the thoracentesis on 5/5. ? ?Admitted to hospitalist service.  IR consulted for placement of PleurX pigtail catheter, as patient prefers not to have to undergo frequent serial paracenteses. ? ?5/7: requiring low dose morphine to control dyspnea. ? ?5/8: Pleurx catheter placed by interventional radiology. ? 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  Dr. Tasia Catchings was reconsulted. ? ?11/26/2021 CT scan of the chest showed no evidence of pulmonary embolism or thoracic dissection.  Showed a moderate to large pericardial effusion and small bilateral pleural effusions.  Infiltrate seen in both lungs and COPD seen.  Patient was started on Rocephin and Zithromax on 11/27/2021. ? ?Echocardiogram resulted on 11/28/2021 showed a moderate pericardial effusion with normal ejection fraction and no signs of pericardial tamponade.   ? ?Cardiology on the evening of 5/12 recommended cardiothoracic surgery consultation for pericardial window to  reduce the amount of pericardial effusion and reduce the potential risk of pericardial tamponade.  Case discussed with covering oncology on 11/29/2021 and recommended speaking with cardiothoracic surgery.  I spoke with cardio thoracic surgery Dr. Prescott Gum who reviewed viewed the patient's studies and recommended transfer for pericardial window. ? ?CT scan of the abdomen and pelvis resulted on the morning of 11/29/2021 showed extremely large burden of stool and stool balls within the cecum.  Stomach is distended and there is transit of oral contrast into the small bowel.  Could have incomplete gastric outlet obstruction.  Small bilateral effusions.  Masslike consolidation infrahilar right lung and right middle lobe. ? ?The patient does not having any nausea or vomiting but not eating very much.  Does complain of constipation so I will get more aggressive with anticonstipation medications. ? ?Patient does have a lot of pain with fluid removal from Pleurx catheter.  Had to start OxyContin and oxycodone to control pain.  As needed IV morphine. ? ? ? ?Assessment and Plan: ?* Pericardial effusion ?Echocardiogram shows moderate pericardial effusion without evidence of tamponade.  Cardiology recommended cardiothoracic consultation for pericardial window.  Case discussed with Dr. Darcey Nora and he will see the patient in consultation when the patient is transferred over to [facility]. ? ?Metastatic adenocarcinoma ([place]) ?Pleural fluid came back metastatic adenocarcinoma Likely lung origin.  CT scan of the abdomen did show a mass in the lung. ? ?Pleural effusion on right ?-patient underwent thoracentesis on 5/5 with 1.2 L fluid removed.  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 ?--IR placed a Pleurx drainage catheter on 5/8 ?--Oncology follow-up ?--Oral oxycodone 10 mg as needed for pain ?--OxyContin 10 mg twice a day ? ?  Acute on chronic respiratory failure with hypoxia (HCC) ?This morning patient on 4 L of oxygen.   On admission on 6 L had a pulse ox of 90%.  The patient was hypoxic on room air and is a candidate for home oxygen.  She held her sats walking around the hallway on 4 L today. ? ?Protein-calorie malnutrition, severe ?related to chronic illness  ? ?Weight loss, unintentional ?Likely secondary to malignant process and not eating well. ?Body mass index is 18.64 kg/m?. ? ? ?Hypokalemia ?Replaced insulin normal range as of 11/28/2021 ? ?Hypotension ?Hypotension and tachycardia on low-dose midodrine. ? ?Mid back pain ?Lidocaine patch ? ?SIRS (systemic inflammatory response syndrome) (HCC) ?CT scan showing pneumonia.  Continue 5 days of Rocephin and Zithromax started on 11/26/2021 ? ? ?Hypoalbuminemia due to protein-calorie malnutrition ([place]) ?Dietitian consulted ? ?Anxiety and depression ?Continue Zoloft and Valium ? ?MS (multiple sclerosis) ([place]) ?Continue home Flexeril 10 mg p.o. twice daily ? ? ? ? ?  ? ?Subjective: Patient walked around the nursing station with the staff today.  She was able to hold her sats at 91% on 4 L of oxygen.  Has pain when they drain the Pleurx catheter.  Pain better controlled than when she came in since starting the long-acting OxyContin ? ?Physical Exam: ?Vitals:  ? 11/29/21 0056 11/29/21 0616 11/29/21 0908 11/29/21 1609  ?BP: 113/70 130/75 122/69 127/87  ?Pulse: 88 87 89 (!) 103  ?Resp: 18 18 17 17   ?Temp: 97.8 ?F (36.6 ?C) 98 ?F (36.7 ?C) 97.6 ?F (36.4 ?C) 97.8 ?F (36.6 ?C)  ?TempSrc: Oral  Oral Oral  ?SpO2: 98% 96% 99% 91%  ?Weight:      ?Height:      ? ?Physical Exam ?HENT:  ?   Head: Normocephalic.  ?   Mouth/Throat:  ?   Pharynx: No oropharyngeal exudate.  ?Eyes:  ?   General: Lids are normal.  ?   Conjunctiva/sclera: Conjunctivae normal.  ?Cardiovascular:  ?   Rate and Rhythm: Regular rhythm. Tachycardia present.  ?   Heart sounds: Normal heart sounds, S1 normal and S2 normal.  ?Pulmonary:  ?   Breath sounds: Examination of the right-lower field reveals decreased breath sounds and  rhonchi. Decreased breath sounds and rhonchi present. No wheezing or rales.  ?Abdominal:  ?   Palpations: Abdomen is soft.  ?   Tenderness: There is no abdominal tenderness.  ?Musculoskeletal:  ?   Right lower leg: No swelling.  ?   Left lower leg: No swelling.  ?Skin: ?   General: Skin is warm.  ?   Findings: No rash.  ?Neurological:  ?   Mental Status: She is alert and oriented to person, place, and time.  ?  ?Data Reviewed: ?CT scan of the abdomen pelvis reviewed and discussed with oncologist. ?Echocardiogram reviewed with cardiothoracic surgery. ? ?Family Communication: Spoke with brother at the bedside ? ?Disposition: ?Status is: Inpatient ?Remains inpatient appropriate because: Cardiology recommending cardiothoracic surgery evaluation for pericardial window.  Case discussed with covering oncologist and recommended speaking with cardiothoracic surgery. ?Planned Discharge Destination: [facility] when bed available for evaluation for pericardial window for malignant pericardial effusion ?Plan after [facility] hospitalization will be home with home health and follow-up with oncology Dr. Tasia Catchings as outpatient ? ? ?Author: ?Loletha Grayer, MD ?11/29/2021 5:30 PM ? ?For on call review [email or web address].  ?

## 2021-11-30 ENCOUNTER — Encounter (HOSPITAL_COMMUNITY): Payer: Self-pay | Admitting: Internal Medicine

## 2021-11-30 ENCOUNTER — Inpatient Hospital Stay (HOSPITAL_COMMUNITY)
Admission: AD | Admit: 2021-11-30 | Discharge: 2021-12-18 | DRG: 853 | Disposition: E | Payer: Medicare Other | Source: Other Acute Inpatient Hospital | Attending: Internal Medicine | Admitting: Internal Medicine

## 2021-11-30 ENCOUNTER — Other Ambulatory Visit: Payer: Self-pay

## 2021-11-30 ENCOUNTER — Inpatient Hospital Stay (HOSPITAL_COMMUNITY): Payer: Medicare Other

## 2021-11-30 DIAGNOSIS — I3131 Malignant pericardial effusion in diseases classified elsewhere: Secondary | ICD-10-CM | POA: Diagnosis present

## 2021-11-30 DIAGNOSIS — C3400 Malignant neoplasm of unspecified main bronchus: Secondary | ICD-10-CM

## 2021-11-30 DIAGNOSIS — I3139 Other pericardial effusion (noninflammatory): Principal | ICD-10-CM | POA: Diagnosis present

## 2021-11-30 DIAGNOSIS — K5641 Fecal impaction: Secondary | ICD-10-CM | POA: Diagnosis not present

## 2021-11-30 DIAGNOSIS — R0602 Shortness of breath: Secondary | ICD-10-CM | POA: Diagnosis not present

## 2021-11-30 DIAGNOSIS — R6510 Systemic inflammatory response syndrome (SIRS) of non-infectious origin without acute organ dysfunction: Secondary | ICD-10-CM | POA: Diagnosis not present

## 2021-11-30 DIAGNOSIS — C342 Malignant neoplasm of middle lobe, bronchus or lung: Secondary | ICD-10-CM | POA: Diagnosis present

## 2021-11-30 DIAGNOSIS — F32A Depression, unspecified: Secondary | ICD-10-CM | POA: Diagnosis present

## 2021-11-30 DIAGNOSIS — Z66 Do not resuscitate: Secondary | ICD-10-CM | POA: Diagnosis present

## 2021-11-30 DIAGNOSIS — J189 Pneumonia, unspecified organism: Secondary | ICD-10-CM | POA: Diagnosis present

## 2021-11-30 DIAGNOSIS — Z800 Family history of malignant neoplasm of digestive organs: Secondary | ICD-10-CM

## 2021-11-30 DIAGNOSIS — G35 Multiple sclerosis: Secondary | ICD-10-CM | POA: Diagnosis present

## 2021-11-30 DIAGNOSIS — K5900 Constipation, unspecified: Secondary | ICD-10-CM | POA: Diagnosis not present

## 2021-11-30 DIAGNOSIS — I10 Essential (primary) hypertension: Secondary | ICD-10-CM | POA: Diagnosis present

## 2021-11-30 DIAGNOSIS — E785 Hyperlipidemia, unspecified: Secondary | ICD-10-CM | POA: Diagnosis present

## 2021-11-30 DIAGNOSIS — J9601 Acute respiratory failure with hypoxia: Secondary | ICD-10-CM | POA: Diagnosis present

## 2021-11-30 DIAGNOSIS — J90 Pleural effusion, not elsewhere classified: Secondary | ICD-10-CM | POA: Diagnosis present

## 2021-11-30 DIAGNOSIS — K56609 Unspecified intestinal obstruction, unspecified as to partial versus complete obstruction: Secondary | ICD-10-CM

## 2021-11-30 DIAGNOSIS — Z8249 Family history of ischemic heart disease and other diseases of the circulatory system: Secondary | ICD-10-CM | POA: Diagnosis not present

## 2021-11-30 DIAGNOSIS — K311 Adult hypertrophic pyloric stenosis: Secondary | ICD-10-CM | POA: Diagnosis present

## 2021-11-30 DIAGNOSIS — Z681 Body mass index (BMI) 19 or less, adult: Secondary | ICD-10-CM | POA: Diagnosis not present

## 2021-11-30 DIAGNOSIS — R64 Cachexia: Secondary | ICD-10-CM | POA: Diagnosis present

## 2021-11-30 DIAGNOSIS — Z833 Family history of diabetes mellitus: Secondary | ICD-10-CM

## 2021-11-30 DIAGNOSIS — Z515 Encounter for palliative care: Secondary | ICD-10-CM | POA: Diagnosis not present

## 2021-11-30 DIAGNOSIS — E875 Hyperkalemia: Secondary | ICD-10-CM | POA: Diagnosis not present

## 2021-11-30 DIAGNOSIS — E43 Unspecified severe protein-calorie malnutrition: Secondary | ICD-10-CM | POA: Diagnosis not present

## 2021-11-30 DIAGNOSIS — R54 Age-related physical debility: Secondary | ICD-10-CM | POA: Diagnosis present

## 2021-11-30 DIAGNOSIS — A419 Sepsis, unspecified organism: Secondary | ICD-10-CM | POA: Diagnosis present

## 2021-11-30 DIAGNOSIS — C799 Secondary malignant neoplasm of unspecified site: Secondary | ICD-10-CM

## 2021-11-30 DIAGNOSIS — G35D Multiple sclerosis, unspecified: Secondary | ICD-10-CM | POA: Diagnosis present

## 2021-11-30 DIAGNOSIS — Z7189 Other specified counseling: Secondary | ICD-10-CM | POA: Diagnosis not present

## 2021-11-30 DIAGNOSIS — Z79899 Other long term (current) drug therapy: Secondary | ICD-10-CM

## 2021-11-30 DIAGNOSIS — H409 Unspecified glaucoma: Secondary | ICD-10-CM | POA: Diagnosis present

## 2021-11-30 DIAGNOSIS — T380X5A Adverse effect of glucocorticoids and synthetic analogues, initial encounter: Secondary | ICD-10-CM | POA: Diagnosis not present

## 2021-11-30 DIAGNOSIS — J910 Malignant pleural effusion: Secondary | ICD-10-CM | POA: Diagnosis present

## 2021-11-30 DIAGNOSIS — F419 Anxiety disorder, unspecified: Secondary | ICD-10-CM

## 2021-11-30 DIAGNOSIS — J188 Other pneumonia, unspecified organism: Secondary | ICD-10-CM | POA: Diagnosis present

## 2021-11-30 DIAGNOSIS — I959 Hypotension, unspecified: Secondary | ICD-10-CM | POA: Diagnosis not present

## 2021-11-30 DIAGNOSIS — E038 Other specified hypothyroidism: Secondary | ICD-10-CM | POA: Diagnosis present

## 2021-11-30 DIAGNOSIS — Z8261 Family history of arthritis: Secondary | ICD-10-CM

## 2021-11-30 DIAGNOSIS — N179 Acute kidney failure, unspecified: Secondary | ICD-10-CM | POA: Diagnosis not present

## 2021-11-30 DIAGNOSIS — C3490 Malignant neoplasm of unspecified part of unspecified bronchus or lung: Secondary | ICD-10-CM | POA: Diagnosis not present

## 2021-11-30 DIAGNOSIS — R531 Weakness: Secondary | ICD-10-CM | POA: Diagnosis not present

## 2021-11-30 DIAGNOSIS — F418 Other specified anxiety disorders: Secondary | ICD-10-CM | POA: Diagnosis not present

## 2021-11-30 DIAGNOSIS — R7401 Elevation of levels of liver transaminase levels: Secondary | ICD-10-CM

## 2021-11-30 DIAGNOSIS — C7800 Secondary malignant neoplasm of unspecified lung: Secondary | ICD-10-CM

## 2021-11-30 HISTORY — DX: Other pericardial effusion (noninflammatory): I31.39

## 2021-11-30 LAB — URINALYSIS, ROUTINE W REFLEX MICROSCOPIC
Bilirubin Urine: NEGATIVE
Glucose, UA: NEGATIVE mg/dL
Hgb urine dipstick: NEGATIVE
Ketones, ur: NEGATIVE mg/dL
Leukocytes,Ua: NEGATIVE
Nitrite: NEGATIVE
Protein, ur: NEGATIVE mg/dL
Specific Gravity, Urine: 1.011 (ref 1.005–1.030)
pH: 7 (ref 5.0–8.0)

## 2021-11-30 LAB — SURGICAL PCR SCREEN
MRSA, PCR: NEGATIVE
Staphylococcus aureus: NEGATIVE

## 2021-11-30 MED ORDER — SODIUM CHLORIDE 0.9 % IV SOLN
2.0000 g | INTRAVENOUS | Status: AC
  Administered 2021-11-30 – 2021-12-01 (×2): 2 g via INTRAVENOUS
  Filled 2021-11-30 (×2): qty 20

## 2021-11-30 MED ORDER — ENOXAPARIN SODIUM 40 MG/0.4ML IJ SOSY
40.0000 mg | PREFILLED_SYRINGE | INTRAMUSCULAR | Status: DC
  Administered 2021-11-30: 40 mg via SUBCUTANEOUS
  Filled 2021-11-30: qty 0.4

## 2021-11-30 MED ORDER — OXYCODONE HCL ER 10 MG PO T12A
10.0000 mg | EXTENDED_RELEASE_TABLET | Freq: Two times a day (BID) | ORAL | Status: DC
  Administered 2021-11-30 – 2021-12-05 (×9): 10 mg via ORAL
  Filled 2021-11-30 (×9): qty 1

## 2021-11-30 MED ORDER — MIDODRINE HCL 5 MG PO TABS
5.0000 mg | ORAL_TABLET | Freq: Two times a day (BID) | ORAL | Status: DC
  Administered 2021-11-30 – 2021-12-02 (×5): 5 mg via ORAL
  Filled 2021-11-30 (×5): qty 1

## 2021-11-30 MED ORDER — LACTULOSE 10 GM/15ML PO SOLN: 30.0000 g | Freq: Every day | ORAL | Status: DC | PRN

## 2021-11-30 MED ORDER — OXYCODONE HCL 5 MG PO TABS
10.0000 mg | ORAL_TABLET | ORAL | Status: DC | PRN
  Administered 2021-11-30 – 2021-12-07 (×20): 10 mg via ORAL
  Filled 2021-11-30 (×20): qty 2

## 2021-11-30 MED ORDER — ACETAMINOPHEN 325 MG PO TABS: 650.0000 mg | ORAL_TABLET | Freq: Four times a day (QID) | ORAL | Status: DC | PRN

## 2021-11-30 MED ORDER — ONDANSETRON HCL 4 MG PO TABS
4.0000 mg | ORAL_TABLET | Freq: Four times a day (QID) | ORAL | Status: DC | PRN
Start: 2021-11-30 — End: 2021-12-09
  Administered 2021-12-04: 4 mg via ORAL

## 2021-11-30 MED ORDER — METHOCARBAMOL 500 MG PO TABS
500.0000 mg | ORAL_TABLET | Freq: Three times a day (TID) | ORAL | Status: DC
Start: 2021-11-30 — End: 2021-12-05
  Administered 2021-11-30 – 2021-12-05 (×14): 500 mg via ORAL
  Filled 2021-11-30 (×15): qty 1

## 2021-11-30 MED ORDER — ACETAMINOPHEN 650 MG RE SUPP: 650.0000 mg | Freq: Four times a day (QID) | RECTAL | Status: DC | PRN

## 2021-11-30 MED ORDER — GUAIFENESIN ER 600 MG PO TB12
600.0000 mg | ORAL_TABLET | Freq: Two times a day (BID) | ORAL | Status: DC
  Administered 2021-11-30 (×2): 600 mg via ORAL
  Filled 2021-11-30 (×3): qty 1

## 2021-11-30 MED ORDER — ENOXAPARIN SODIUM 30 MG/0.3ML IJ SOSY
30.0000 mg | PREFILLED_SYRINGE | INTRAMUSCULAR | Status: DC
  Administered 2021-12-01 – 2021-12-07 (×6): 30 mg via SUBCUTANEOUS
  Filled 2021-11-30 (×7): qty 0.3

## 2021-11-30 MED ORDER — ONDANSETRON HCL 4 MG/2ML IJ SOLN
4.0000 mg | Freq: Four times a day (QID) | INTRAMUSCULAR | Status: DC | PRN
  Administered 2021-12-05 – 2021-12-06 (×2): 4 mg via INTRAVENOUS
  Filled 2021-11-30 (×3): qty 2

## 2021-11-30 MED ORDER — ENSURE ENLIVE PO LIQD
237.0000 mL | Freq: Three times a day (TID) | ORAL | Status: DC
Start: 2021-11-30 — End: 2021-11-30

## 2021-11-30 MED ORDER — SERTRALINE HCL 50 MG PO TABS
50.0000 mg | ORAL_TABLET | Freq: Every day | ORAL | Status: DC
  Administered 2021-11-30 – 2021-12-05 (×5): 50 mg via ORAL
  Filled 2021-11-30 (×5): qty 1

## 2021-11-30 MED ORDER — ENSURE ENLIVE PO LIQD
237.0000 mL | Freq: Three times a day (TID) | ORAL | Status: DC
  Administered 2021-11-30 – 2021-12-04 (×8): 237 mL via ORAL

## 2021-11-30 MED ORDER — DIAZEPAM 5 MG PO TABS
10.0000 mg | ORAL_TABLET | Freq: Three times a day (TID) | ORAL | Status: DC | PRN
  Administered 2021-12-01 – 2021-12-06 (×7): 10 mg via ORAL
  Filled 2021-11-30 (×7): qty 2

## 2021-11-30 MED ORDER — AZITHROMYCIN 250 MG PO TABS
250.0000 mg | ORAL_TABLET | Freq: Every day | ORAL | Status: AC
  Administered 2021-11-30 – 2021-12-01 (×2): 250 mg via ORAL
  Filled 2021-11-30 (×2): qty 1

## 2021-11-30 MED ORDER — PREDNISONE 20 MG PO TABS
40.0000 mg | ORAL_TABLET | Freq: Every day | ORAL | Status: AC
  Administered 2021-11-30 – 2021-12-01 (×2): 40 mg via ORAL
  Filled 2021-11-30 (×2): qty 2

## 2021-11-30 MED ORDER — CYCLOBENZAPRINE HCL 10 MG PO TABS
10.0000 mg | ORAL_TABLET | Freq: Two times a day (BID) | ORAL | Status: DC
  Administered 2021-11-30 – 2021-12-05 (×9): 10 mg via ORAL
  Filled 2021-11-30 (×10): qty 1

## 2021-11-30 MED ORDER — MORPHINE SULFATE (PF) 2 MG/ML IV SOLN
1.0000 mg | INTRAVENOUS | Status: DC | PRN
  Administered 2021-11-30 – 2021-12-02 (×7): 2 mg via INTRAVENOUS
  Filled 2021-11-30 (×7): qty 1

## 2021-11-30 MED ORDER — ENSURE MAX PROTEIN PO LIQD
11.0000 [oz_av] | Freq: Two times a day (BID) | ORAL | Status: DC
  Administered 2021-11-30 – 2021-12-01 (×2): 11 [oz_av] via ORAL
  Filled 2021-11-30 (×5): qty 330

## 2021-11-30 MED ORDER — OXYBUTYNIN CHLORIDE 5 MG PO TABS
5.0000 mg | ORAL_TABLET | Freq: Two times a day (BID) | ORAL | Status: DC
  Administered 2021-11-30 – 2021-12-05 (×9): 5 mg via ORAL
  Filled 2021-11-30 (×10): qty 1

## 2021-11-30 MED ORDER — SENNOSIDES-DOCUSATE SODIUM 8.6-50 MG PO TABS
1.0000 | ORAL_TABLET | Freq: Two times a day (BID) | ORAL | Status: DC
  Administered 2021-11-30 (×2): 1 via ORAL
  Filled 2021-11-30 (×3): qty 1

## 2021-11-30 MED ORDER — LACTATED RINGERS IV SOLN: INTRAVENOUS | Status: DC

## 2021-11-30 MED ORDER — POLYETHYLENE GLYCOL 3350 17 G PO PACK
17.0000 g | PACK | Freq: Two times a day (BID) | ORAL | Status: DC
  Administered 2021-11-30 – 2021-12-05 (×8): 17 g via ORAL
  Filled 2021-11-30 (×8): qty 1

## 2021-11-30 NOTE — Assessment & Plan Note (Addendum)
Patient underwent thoracentesis on 5/5 with 1.2 L fluid removed.  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 ?IR placed a Pleurx drainage catheter on 5/8 ?1. Oncology follow-up ?2. Oral oxycodone 10 mg as needed for pain ?3. OxyContin 10 mg twice a day ?4. Ordered drainage per the Plurex order set. ?5. Sounds like they were trying to progress to every other day drainage per patient ?

## 2021-11-30 NOTE — Plan of Care (Signed)
[facility] just picked up pt to transport to [facility].  Brother, Earling notified of transport and where she's going.  ?

## 2021-11-30 NOTE — Progress Notes (Signed)
? ?[facility] ?PROGRESS NOTE ? ? ?JASHLEY YELLIN WUX:[id number] DOB: [birth year] DOA: 12/17/2021 ? ?PCP: Dion Body, MD ? ?Brief History/Interval Summary: 66 y.o. female with medical history significant of depression, anxiety, MS, HTN. ?  ?Pt with recent admit also at [facility] for SOB, pleural effusion on 5/4.  Effusion drained but rapidly re-occurred and pt readmitted to [facility] 5/6. ?  ?Summary of stay at [facility]: ?  ?5/7: requiring low dose morphine to control dyspnea. ?  ?5/8: Pleurx catheter placed by interventional radiology. ?  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  Dr. Tasia Catchings was reconsulted. ?  ?11/26/2021 CT scan of the chest showed no evidence of pulmonary embolism or thoracic dissection.  Showed a moderate to large pericardial effusion and small bilateral pleural effusions.  Infiltrate seen in both lungs and COPD seen.  Patient was started on Rocephin and Zithromax on 11/27/2021. ?  ?Echocardiogram resulted on 11/28/2021 showed a moderate pericardial effusion with normal ejection fraction and no signs of pericardial tamponade.   ?  ?Cardiology on the evening of 5/12 recommended cardiothoracic surgery consultation for pericardial window to reduce the amount of pericardial effusion and reduce the potential risk of pericardial tamponade.  Case discussed with covering oncology on 11/29/2021 and recommended speaking with cardiothoracic surgery.  Hospitalist at [facility] spoke with cardio thoracic surgery Dr. Prescott Gum who reviewed viewed the patient's studies and recommended transfer to [facility] for pericardial window. ?  ?Stay also complicated by constipation demonstrated on CT scan.  Some question of gastric outlet obstruction but pt without N/V.  Pt started on laxatives for constipation. ? ? ?Consultants:  ?Medical oncology at [facility]: Dr. Tasia Catchings.   ?Interventional radiology at [facility]  ?Cardiology at [facility]: Dr. Nehemiah Massed. ?Cardiothoracic surgery here at  [facility]: Dr. Darcey Nora ?Palliative care at [facility] ? ?Procedures: None yet ? ? ? ?Subjective/Interval History: ?Patient complains of feeling fatigued.  Denies any chest pain.  Shortness of breath has improved after Pleurx catheter placement.  Occasional chest pain is present.  Denies any dizziness or lightheadedness.  Her brother is at the bedside. ? ? ? ?Assessment/Plan: ? ?Pericardial effusion ?Echo shows large pericardial effusion but no tamponade physiology at this time. Pt transferred from [facility] to [facility] for pericardial window ?Patient seen by Dr. Darcey Nora this morning.  Appears that the plan is tentatively for surgery on Tuesday.   ?Seems to be hemodynamically stable at this time. ?Effusion most likely malignant in etiology. ?  ?Acute respiratory failure with hypoxia ([place]) ?4L O2 requirement down from 6L on admission to [facility]. ?Due to PNA + cancer. ?Will likely need home oxygen at discharge. ?  ?Metastatic adenocarcinoma ([place]) ?Pt with malignant pleural effusion on cytology.  Likely lung origin.  By Dr. Tasia Catchings with medical oncology at [facility].  Will need outpatient follow-up with him after discharge. ?Per last note by Dr. Tasia Catchings, prognosis is thought to be poor.  Waiting on further studies to see if she will be a candidate for immunotherapy.  Patient told him that she was leaning towards no chemotherapy.  Could be a candidate for hospice going forward but will depend on results of further test that were ordered. ?  ?Hypotension ?Pt had tachycardia and hypotension during stay at [facility], started on midodrine.  Pressure is noted to be normal. ?  ?Community-acquired pneumonia ?CT at [facility] showing PNA.  Was placed on ceftriaxone and azithromycin.  Will complete course here. ?  ?Pleural effusion on right ?Patient underwent  thoracentesis on 5/5 with 1.2 L fluid removed.  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 IR placed a Pleurx drainage catheter on 5/8. ?Experiencing significant pain and discomfort.  Was started on OxyContin 10  mg twice a day and oxycodone as needed. Drainage per the Plurex order set. ?  ?Constipation ?Acute constipation noted on CT. started on aggressive bowel regimen.  There was some question of gastric outlet obstruction but patient without any symptoms.  Continue to monitor for now.  Constipation likely due to pain medications.  Check TSH. ?  ?Protein-calorie malnutrition, severe ?In setting of metastatic CA ?  ?Anxiety and depression ?Continue zoloft and valium. ? ?History of multiple sclerosis ?Stable.  Uses Flexeril at home. ? ?Goals of care ?She was being seen by palliative care at [facility].  Noted to be DNR. ?  ?  ? ?DVT Prophylaxis: Lovenox ?Code Status: DNR ?Family Communication: Discussed with patient and her brother ?Disposition Plan: To be determined was seen by PT and OT a few days ago and home health was recommended at that time. ? ?Status is: Inpatient ?Remains inpatient appropriate because: Pericardial effusion requiring pericardial window ? ? ? ? ? ?Medications: Scheduled: ? azithromycin  250 mg Oral Daily  ? cyclobenzaprine  10 mg Oral BID  ? enoxaparin (LOVENOX) injection  40 mg Subcutaneous Q24H  ? feeding supplement  237 mL Oral TID BM  ? guaiFENesin  600 mg Oral BID  ? methocarbamol  500 mg Oral Q8H  ? midodrine  5 mg Oral BID WC  ? oxybutynin  5 mg Oral BID  ? oxyCODONE  10 mg Oral Q12H  ? polyethylene glycol  17 g Oral BID  ? predniSONE  40 mg Oral Q breakfast  ? Ensure Max Protein  11 oz Oral BID  ? senna-docusate  1 tablet Oral BID  ? sertraline  50 mg Oral Daily  ? ?Continuous: ? cefTRIAXone (ROCEPHIN) IVPB 2 gram/100 mL NS (Mini-Bag Plus) 2 g (12/17/2021 1018)  ? ?PPI:RJJOACZYSAYTK **OR** acetaminophen, diazepam, lactulose, morphine (PF), ondansetron **OR** ondansetron (ZOFRAN) IV, oxyCODONE ? ?Antibiotics: ?Anti-infectives (From admission, onward)  ? ? Start     Dose/Rate Route Frequency Ordered Stop  ? 12/06/2021 1000  azithromycin (ZITHROMAX) tablet 250 mg       ? 250 mg Oral Daily 12/07/2021 0122  11/22/2021 0959  ? 11/20/2021 1000  cefTRIAXone (ROCEPHIN) 2 g in sodium chloride 0.9 % 100 mL IVPB       ? 2 g ?200 mL/hr over 30 Minutes Intravenous Every 24 hours 11/23/2021 0122 11/22/2021 0959  ? ?  ? ? ?Objective: ? ?Vital Signs ? ?Vitals:  ? 11/18/2021 0122 11/29/2021 0155 12/12/2021 0409 12/15/2021 1601  ?BP: 131/75 131/75 116/69 125/80  ?Pulse: 99 99 87 92  ?Resp: 20 20 18 20   ?Temp: 98 ?F (36.7 ?C) 98 ?F (36.7 ?C) 98 ?F (36.7 ?C) 97.6 ?F (36.4 ?C)  ?TempSrc:  Oral Oral Oral  ?SpO2: 97%  96% 97%  ?Weight:  42.9 kg    ?Height:  4\' 10"  (1.473 m)    ? ? ?Intake/Output Summary (Last 24 hours) at 11/28/2021 1109 ?Last data filed at 12/01/2021 0932 ?Gross per 24 hour  ?Intake 240 ml  ?Output --  ?Net 240 ml  ? ?Filed Weights  ? 12/05/2021 0155  ?Weight: 42.9 kg  ? ? ?General appearance: Awake alert.  In no distress ?Resp: Mildly tachypneic.  Diminished air entry at the bases especially on the right.  Few crackles.  No wheezing or rhonchi. ?  Cardio: S1-S2 is normal regular.  No S3-S4.  No rubs murmurs or bruit ?GI: Abdomen is soft.  Nontender nondistended.  Bowel sounds are present normal.  No masses organomegaly ?Extremities: No edema.  Able to move her extremities.  Physical deconditioning is noted. ?Neurologic: Alert and oriented x3.  No focal neurological deficits.  ? ? ?Lab Results: ? ?Data Reviewed: I have personally reviewed following labs and reports of the imaging studies ? ?CBC: ?Recent Labs  ?Lab 11/26/21 ?2301 11/28/21 ?7414  ?WBC 10.1 13.3*  ?HGB 13.1 12.4  ?HCT 40.7 38.2  ?MCV 90.6 91.0  ?PLT 515* 565*  ? ? ?Basic Metabolic Panel: ?Recent Labs  ?Lab 11/25/21 ?0430 11/28/21 ?2395  ?NA 136 136  ?K 3.9 4.5  ?CL 99 99  ?CO2 28 29  ?GLUCOSE 136* 131*  ?BUN 22 19  ?CREATININE 0.74 0.61  ?CALCIUM 8.6* 9.0  ? ? ?GFR: ?Estimated Creatinine Clearance: 44.1 mL/min (by C-G formula based on SCr of 0.61 mg/dL). ? ? ? ?Recent Results (from the past 240 hour(s))  ?Blood Culture (routine x 2)     Status: None  ? Collection Time: 11/20/21   7:10 PM  ? Specimen: BLOOD  ?Result Value Ref Range Status  ? Specimen Description BLOOD RIGHT ANTECUBITAL  Final  ? Special Requests   Final  ?  BOTTLES DRAWN AEROBIC AND ANAEROBIC Blood Culture adequate

## 2021-11-30 NOTE — Assessment & Plan Note (Addendum)
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  Likely lung origin. ?1. F/u oncology. ?

## 2021-11-30 NOTE — Assessment & Plan Note (Addendum)
1. Continue zoloft and valium. ?

## 2021-11-30 NOTE — H&P (Signed)
?History and Physical  ? ? ?Patient: Carly Abbott HFW:[id number] DOB: 11/16/1955 ?DOA: 12/16/2021 ?DOS: the patient was seen and examined on 12/07/2021 ?PCP: Dion Body, MD  ?Patient coming from: Home ? ?Chief Complaint: Pericardial Effusion, transferred for pericardial window. ? ? ?HPI: Carly Abbott is a 67 y.o. female with medical history significant of depression, anxiety, MS, HTN. ? ?Pt was admitted to [facility] for SOB: ? ?Pt with recent admit also at [facility] for SOB, pleural effusion on 5/4.  Effusion drained but rapidly re-occurred and pt readmitted to [facility] 5/6. ? ?Summary of stay at [facility]: ? ?5/7: requiring low dose morphine to control dyspnea. ?  ?5/8: Pleurx catheter placed by interventional radiology. ?  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  Dr. Tasia Catchings was reconsulted. ?  ?11/26/2021 CT scan of the chest showed no evidence of pulmonary embolism or thoracic dissection.  Showed a moderate to large pericardial effusion and small bilateral pleural effusions.  Infiltrate seen in both lungs and COPD seen.  Patient was started on Rocephin and Zithromax on 11/27/2021. ?  ?Echocardiogram resulted on 11/28/2021 showed a moderate pericardial effusion with normal ejection fraction and no signs of pericardial tamponade.   ?  ?Cardiology on the evening of 5/12 recommended cardiothoracic surgery consultation for pericardial window to reduce the amount of pericardial effusion and reduce the potential risk of pericardial tamponade.  Case discussed with covering oncology on 11/29/2021 and recommended speaking with cardiothoracic surgery.  Hospitalist at [facility] spoke with cardio thoracic surgery Dr. Prescott Gum who reviewed viewed the patient's studies and recommended transfer to [facility] for pericardial window. ? ?Stay also complicated by constipation demonstrated on CT scan.  Some question of gastric outlet obstruction but pt without N/V.  Pt started on laxatives for  constipation. ? ?  ?Review of Systems: As mentioned in the history of present illness. All other systems reviewed and are negative. ?Past Medical History:  ?Diagnosis Date  ? Depression   ? Glaucoma   ? Headache   ? Hyperlipidemia   ? Multiple sclerosis ([place])   ? Spinal stenosis   ? ?Past Surgical History:  ?Procedure Laterality Date  ? BACK SURGERY    ? CARPAL TUNNEL RELEASE    ? CHOLECYSTECTOMY    ? COLONOSCOPY    ? COLONOSCOPY WITH PROPOFOL N/A 05/18/2016  ? Procedure: COLONOSCOPY WITH PROPOFOL;  Surgeon: Manya Silvas, MD;  Location: [facility] ENDOSCOPY;  Service: Endoscopy;  Laterality: N/A;  ? IR PERC PLEURAL DRAIN W/INDWELL CATH W/IMG GUIDE  11/24/2021  ? LEG ANGIOGRAPHY    ? TONSILLECTOMY    ? ?Social History:  reports that she has never smoked. She has never used smokeless tobacco. She reports that she does not drink alcohol and does not use drugs. ? ?Allergies  ?Allergen Reactions  ? Cardizem [Diltiazem Hcl] Itching  ? Other Hives  ?  TECFREDA   ? ? ?Family History  ?Problem Relation Age of Onset  ? Colon cancer Mother   ? Hypertension Father   ? Rheum arthritis Brother   ? Diabetes Brother   ? Breast cancer Neg Hx   ? ? ?Prior to Admission medications   ?Medication Sig Start Date End Date Taking? Authorizing Provider  ?acetaminophen (TYLENOL) 500 MG tablet Take 2 tablets (1,000 mg total) by mouth every 8 (eight) hours. 11/29/21   Loletha Grayer, MD  ?azithromycin (ZITHROMAX) 250 MG tablet Take 1 tablet (250 mg total) by mouth daily for 2  days. 12/14/2021 12/14/2021  Loletha Grayer, MD  ?cefTRIAXone 2 g in sodium chloride 0.9 % 100 mL Inject 2 g into the vein daily for 2 days. 11/29/21 12/01/21  Loletha Grayer, MD  ?cyclobenzaprine (FLEXERIL) 10 MG tablet Take 10 mg by mouth 2 (two) times daily.    [provider]  ?diazepam (VALIUM) 10 MG tablet Take 1 tablet (10 mg total) by mouth every 8 (eight) hours as needed for anxiety. 11/29/21   Loletha Grayer, MD  ?Ensure Max Protein (ENSURE MAX PROTEIN) LIQD  Take 330 mLs (11 oz total) by mouth 2 (two) times daily. 11/29/21   Loletha Grayer, MD  ?feeding supplement (ENSURE ENLIVE / ENSURE PLUS) LIQD Take 237 mLs by mouth 3 (three) times daily between meals. 11/29/21   Loletha Grayer, MD  ?guaiFENesin (MUCINEX) 600 MG 12 hr tablet Take 1 tablet (600 mg total) by mouth 2 (two) times daily. 11/29/21   Loletha Grayer, MD  ?lactulose (CHRONULAC) 10 GM/15ML solution Take 45 mLs (30 g total) by mouth daily as needed for mild constipation or severe constipation. 11/29/21   Loletha Grayer, MD  ?latanoprost (XALATAN) 0.005 % ophthalmic solution Place 1 drop into both eyes at bedtime. ?Patient not taking: Reported on 11/22/2021 11/22/21   Fritzi Mandes, MD  ?lidocaine (LIDODERM) 5 % Place 1 patch onto the skin daily. Remove & Discard patch within 12 hours or as directed by MD 11/29/21   Loletha Grayer, MD  ?methocarbamol (ROBAXIN) 500 MG tablet Take 1 tablet (500 mg total) by mouth every 8 (eight) hours. 11/29/21   Loletha Grayer, MD  ?midodrine (PROAMATINE) 5 MG tablet Take 1 tablet (5 mg total) by mouth 2 (two) times daily with a meal. 11/29/21   Loletha Grayer, MD  ?Morphine Sulfate (MORPHINE, PF,) 2 MG/ML injection Inject 0.5-1 mLs (1-2 mg total) into the vein every 3 (three) hours as needed (dyspnea). 11/29/21   Loletha Grayer, MD  ?Multiple Vitamin (MULTIVITAMIN WITH MINERALS) TABS tablet Take 1 tablet by mouth daily. ?Patient not taking: Reported on 11/22/2021 11/23/21   Fritzi Mandes, MD  ?oxybutynin (DITROPAN) 5 MG tablet Take 1 tablet (5 mg total) by mouth 2 (two) times daily. 11/29/21   Loletha Grayer, MD  ?oxyCODONE (OXYCONTIN) 10 mg 12 hr tablet Take 1 tablet (10 mg total) by mouth every 12 (twelve) hours. 11/29/21   Loletha Grayer, MD  ?oxyCODONE 10 MG TABS Take 1 tablet (10 mg total) by mouth every 4 (four) hours as needed for moderate pain or breakthrough pain. 11/29/21   Loletha Grayer, MD  ?polyethylene glycol (MIRALAX / GLYCOLAX) 17 g packet Take 17 g by mouth 2  (two) times daily. 11/29/21   Loletha Grayer, MD  ?predniSONE (DELTASONE) 20 MG tablet Take 2 tablets (40 mg total) by mouth daily with breakfast for 2 days. 12/05/2021 11/25/2021  Loletha Grayer, MD  ?senna-docusate (SENOKOT-S) 8.6-50 MG tablet Take 1 tablet by mouth 2 (two) times daily. 11/29/21   Loletha Grayer, MD  ?sertraline (ZOLOFT) 50 MG tablet Take 1 tablet (50 mg total) by mouth daily. 12/06/2021   Loletha Grayer, MD  ? ? ?Physical Exam: ?Vitals:  ? 12/10/2021 0122  ?BP: 131/75  ?Pulse: 99  ?Resp: 20  ?Temp: 98 ?F (36.7 ?C)  ?SpO2: 97%  ? ?Constitutional: NAD, calm, comfortable ?Eyes: PERRL, lids and conjunctivae normal ?ENMT: Mucous membranes are moist. Posterior pharynx clear of any exudate or lesions.Normal dentition.  ?Neck: normal, supple, no masses, no thyromegaly ?Respiratory: Diminished BS and Rhonchi ?Cardiovascular: Tachycardia present ?Abdomen:  no tenderness, no masses palpated. No hepatosplenomegaly. Bowel sounds positive.  ?Musculoskeletal: no clubbing / cyanosis. No joint deformity upper and lower extremities. Good ROM, no contractures. Normal muscle tone.  ?Skin: no rashes, lesions, ulcers. No induration ?Neurologic: CN 2-12 grossly intact. Sensation intact, DTR normal. Strength 5/5 in all 4.  ?Psychiatric: Normal judgment and insight. Alert and oriented x 3. Normal mood.  ? ?Data Reviewed: ? ?There are no new results to review at this time. ? ?Assessment and Plan: ?* Pericardial effusion ?Echo shows large pericardial effusion but no tamponade physiology at this time. ?Pt transferred from [facility] to [facility] for pericardial window ?Per Dr. Prescott Gum: surgery planned for Tues since no tamponade ?Therefore can let pt eat and use lovenox for DVT ppx for now. ?Tele monitor ? ?Acute respiratory failure with hypoxia ([place]) ?4L O2 requirement down from 6L on admission to [facility]. ?Due to PNA + cancer. ?May be candidate for home O2. ? ?Metastatic adenocarcinoma ([place]) ?Pt with malignant pleural effusion on cytology.   Likely lung origin. ?F/u oncology. ? ?Hypotension ?Pt had tachycardia and hypotension during stay at [facility], started on midodrine. ?Continue midodrine for now. ? ?SIRS (systemic inflammatory response sy

## 2021-11-30 NOTE — Assessment & Plan Note (Signed)
In setting of metastatic CA ?

## 2021-11-30 NOTE — Assessment & Plan Note (Addendum)
4L O2 requirement down from 6L on admission to [facility]. ?Due to PNA + cancer. ?1. May be candidate for home O2. ?

## 2021-11-30 NOTE — Assessment & Plan Note (Addendum)
CT at [facility] showing PNA ?1. Has 2 days left of rocephin + azithro ?

## 2021-11-30 NOTE — Assessment & Plan Note (Addendum)
Started on bowel regimen at [facility] ?Was some question of gastric outlet obstruction but pt has no N/V. ?1. Continue bowel regimen ?

## 2021-11-30 NOTE — Assessment & Plan Note (Addendum)
Echo shows large pericardial effusion but no tamponade physiology at this time. ?1. Pt transferred from [facility] to [facility] for pericardial window ?1. Per Dr. Prescott Gum: surgery planned for Tues since no tamponade ?2. Therefore can let pt eat and use lovenox for DVT ppx for now. ?2. Tele monitor ?

## 2021-11-30 NOTE — Plan of Care (Signed)
?  Problem: Health Behavior/Discharge Planning: ?Goal: Ability to manage health-related needs will improve ?Outcome: Progressing ?  ?Problem: Clinical Measurements: ?Goal: Respiratory complications will improve ?Outcome: Progressing ?Goal: Cardiovascular complication will be avoided ?Outcome: Progressing ?  ?Problem: Coping: ?Goal: Level of anxiety will decrease ?Outcome: Progressing ?  ?Problem: Pain Managment: ?Goal: General experience of comfort will improve ?Outcome: Progressing ?  ?

## 2021-11-30 NOTE — Consult Note (Signed)
? ?                 [address] ?           [place] [address] ?         [phone]  ? ?   ? ?Baxter Hire ?[place] Record #[id number] ?Date of Birth: 1955/09/25 ? ?No ref. provider found [facility] hospitalist ?Dion Body, MD ? ?Chief Complaint:   Shortness of breath, fatigue ? ?History of present illness ?Patient examined, images of chest CT, echocardiogram and today's chest x-ray personally reviewed and discussed with patient. 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  A right Pleurx catheter was placed by IR for drainage of a moderate to large malignant effusion.  CT scan demonstrates a right hilar mass as well as a pericardial effusion.  Follow-up echocardiogram documents the pericardial effusion to measure approximately 1.8 cm.  There is good biventricular function and no evidence of tamponade physiology.  However because of the high likelihood the pericardial effusion is also malignant a CT surgical evaluation was requested for subxiphoid pericardial window to drain the effusion and to obtain fluid and tissue for pathology. ? ?The patient has been in decline for the past few weeks.  She is able to walk around her hospital room to the bathroom and back.  She uses a walker intermittently.  She has persistent pain at the Pleurx catheter site.  Last drainage was 2 days ago of 250 cc. ? ?Patient has had a abdominal CT scan.  This demonstrates no metastatic lesions in the liver or adrenals.  However there does appear to be partial gastric outlet obstruction as well as significant stool retention in the mid colon. ? ? ? ?Current Activity/ Functional Status: ?Functional status is poor, patient needs assistance for ADLs. ?  ?Past Medical History:  ?Diagnosis Date  ? Depression   ? Glaucoma   ? Headache   ? Hyperlipidemia   ? Multiple sclerosis ([place])   ? Spinal stenosis   ? ? ?Past Surgical History:  ?Procedure Laterality Date  ? BACK  SURGERY    ? CARPAL TUNNEL RELEASE    ? CHOLECYSTECTOMY    ? COLONOSCOPY    ? COLONOSCOPY WITH PROPOFOL N/A 05/18/2016  ? Procedure: COLONOSCOPY WITH PROPOFOL;  Surgeon: Manya Silvas, MD;  Location: [facility] ENDOSCOPY;  Service: Endoscopy;  Laterality: N/A;  ? IR PERC PLEURAL DRAIN W/INDWELL CATH W/IMG GUIDE  11/24/2021  ? LEG ANGIOGRAPHY    ? TONSILLECTOMY    ? ? ?Social History  ? ?Tobacco Use  ?Smoking Status Never  ?Smokeless Tobacco Never  ?  ?Social History  ? ?Substance and Sexual Activity  ?Alcohol Use No  ? ? ?Social History  ? ?Socioeconomic History  ? Marital status: Divorced  ?  Spouse name: Not on file  ? Number of children: Not on file  ? Years of education: Not on file  ? Highest education level: Not on file  ?Occupational History  ? Not on file  ?Tobacco Use  ? Smoking status: Never  ? Smokeless tobacco: Never  ?Substance and Sexual Activity  ? Alcohol use: No  ? Drug use: No  ? Sexual activity: Not Currently  ?Other Topics Concern  ? Not on file  ?Social History Narrative  ? Not on file  ? ?Social Determinants of Health  ? ?Financial Resource Strain: Not on file  ?Food Insecurity: Not on file  ?Transportation Needs:  Not on file  ?Physical Activity: Not on file  ?Stress: Not on file  ?Social Connections: Not on file  ?Intimate Partner Violence: Not on file  ? ? ?Allergies  ?Allergen Reactions  ? Cardizem [Diltiazem Hcl] Itching  ? Other Hives  ?  TECFREDA   ? ? ?Current Facility-Administered Medications  ?Medication Dose Route Frequency Provider Last Rate Last Admin  ? acetaminophen (TYLENOL) tablet 650 mg  650 mg Oral Q6H PRN Etta Quill, DO      ? Or  ? acetaminophen (TYLENOL) suppository 650 mg  650 mg Rectal Q6H PRN Etta Quill, DO      ? azithromycin [facility]) tablet 250 mg  250 mg Oral Daily Jennette Kettle M, DO   250 mg at 12/10/2021 1007  ? cefTRIAXone (ROCEPHIN) 2 g in sodium chloride 0.9 % 100 mL IVPB  2 g Intravenous Q24H Jennette Kettle M, DO 200 mL/hr at 11/21/2021 1018 2 g at  11/22/2021 1018  ? cyclobenzaprine (FLEXERIL) tablet 10 mg  10 mg Oral BID Jennette Kettle M, DO   10 mg at 12/05/2021 1007  ? diazepam (VALIUM) tablet 10 mg  10 mg Oral Q8H PRN Etta Quill, DO      ? enoxaparin (LOVENOX) injection 40 mg  40 mg Subcutaneous Q24H Jennette Kettle M, DO   40 mg at 12/17/2021 0350  ? feeding supplement (ENSURE ENLIVE / ENSURE PLUS) liquid 237 mL  237 mL Oral TID BM Etta Quill, DO      ? guaiFENesin (MUCINEX) 12 hr tablet 600 mg  600 mg Oral BID Jennette Kettle M, DO   600 mg at 11/17/2021 1009  ? lactulose (CHRONULAC) 10 GM/15ML solution 30 g  30 g Oral Daily PRN Etta Quill, DO      ? methocarbamol (ROBAXIN) tablet 500 mg  500 mg Oral Q8H Jennette Kettle M, DO   500 mg at 12/12/2021 0510  ? midodrine (PROAMATINE) tablet 5 mg  5 mg Oral BID WC Jennette Kettle M, DO   5 mg at 11/17/2021 0938  ? morphine (PF) 2 MG/ML injection 1-2 mg  1-2 mg Intravenous Q3H PRN Etta Quill, DO      ? ondansetron [facility]) tablet 4 mg  4 mg Oral Q6H PRN Etta Quill, DO      ? Or  ? ondansetron (ZOFRAN) injection 4 mg  4 mg Intravenous Q6H PRN Etta Quill, DO      ? oxybutynin (DITROPAN) tablet 5 mg  5 mg Oral BID Etta Quill, DO   5 mg at 11/19/2021 1008  ? oxyCODONE (Oxy IR/ROXICODONE) immediate release tablet 10 mg  10 mg Oral Q4H PRN Etta Quill, DO   10 mg at 11/27/2021 1006  ? oxyCODONE (OXYCONTIN) 12 hr tablet 10 mg  10 mg Oral Q12H Jennette Kettle M, DO   10 mg at 12/17/2021 1008  ? polyethylene glycol (MIRALAX / GLYCOLAX) packet 17 g  17 g Oral BID Jennette Kettle M, DO   17 g at 11/24/2021 1009  ? predniSONE (DELTASONE) tablet 40 mg  40 mg Oral Q breakfast Etta Quill, DO   40 mg at 12/14/2021 1829  ? protein supplement (ENSURE MAX) liquid  11 oz Oral BID Etta Quill, DO   11 oz at 12/09/2021 1007  ? senna-docusate (Senokot-S) tablet 1 tablet  1 tablet Oral BID Etta Quill, DO   1 tablet at 11/28/2021 1009  ? sertraline (ZOLOFT) tablet 50  mg  50 mg Oral Daily Jennette Kettle  M, DO   50 mg at 11/25/2021 1009  ? ? ? ?Family History  ?Problem Relation Age of Onset  ? Colon cancer Mother   ? Hypertension Father   ? Rheum arthritis Brother   ? Diabetes Brother   ? Breast cancer Neg Hx   ? ? ? ?Review of Systems:  ? ?  Cardiac Review of Systems: Y or N ? Chest Pain [yes from Pleurx catheter]  Resting SOB [   ] Exertional SOB  [yes]  Orthopnea [  ] ?  Pedal Edema [   ]    Palpitations [  ] Syncope  [  ]   Presyncope [   ] ? General Review of Systems: [Y] = yes [  ]=no ?Constitional: recent weight change [  ]; anorexia [  ]; fatigue [  ]; nausea [yes]; night sweats [  ]; fever [  ]; or chills [  ];                                                                                                                                          ?Dental: poor dentition[  ]; Last Dentist visit: 1 year ? Eye : blurred vision [  ]; diplopia [   ]; vision changes [  ];  Amaurosis fugax[  ]; ?Resp: cough [place]ris cough];  wheezing[  ];  hemoptysis[  ]; shortness of breath[yes]; paroxysmal nocturnal dyspnea[  ]; dyspnea on exertion[  ]; or orthopnea[  ];  ?GI:  gallstones[  ], vomiting[  ];  dysphagia[  ]; melena[  ];  hematochezia [  ]; heartburn[  ];   Hx of  Colonoscopy[  ]; ?GU: kidney stones [  ]; hematuria[  ];   dysuria [  ];  nocturia[  ];  history of     obstruction [  ]; ?                Skin: rash, swelling[  ];, hair loss[  ];  peripheral edema[  ];  or itching[  ]; ?Musculosketetal: myalgias[  ];  joint swelling[  ];  joint erythema[  ]; ? joint pain[  ];  back pain[yes spinal stenosis]; ? Heme/Lymph: bruising[  ];  bleeding[  ];  anemia[  ];  ?Neuro: TIA[  ];  headaches[  ];  stroke[  ];  vertigo[  ];  seizures[  ];   paresthesias[  ];  difficulty walking[  ] multiple sclerosis history ? Psych:depression[  ]; anxiety[  ]; ? Endocrine: diabetes[  ];  thyroid dysfunction[  ]; ? Immunizations: Flu [  ]; Pneumococcal[  ]; ? Other: ? ?Physical Exam: ?BP 122/64 (BP Location: Left Arm)   Pulse (!) 104   Temp  97.6 ?F (36.4 ?C) (Oral)   Resp 20   Ht 4\' 10"  (1.473 m)  Wt 42.9 kg   SpO2 93%   BMI 19.77 kg/m?  ? ?    ?Physical Exam ? ?General: 66 year old frail chronic ill-appearing female alert and responsive ?HEENT:

## 2021-11-30 NOTE — Assessment & Plan Note (Signed)
Pt had tachycardia and hypotension during stay at [facility], started on midodrine. ?1. Continue midodrine for now. ?

## 2021-12-01 DIAGNOSIS — E43 Unspecified severe protein-calorie malnutrition: Secondary | ICD-10-CM | POA: Diagnosis not present

## 2021-12-01 DIAGNOSIS — I3139 Other pericardial effusion (noninflammatory): Secondary | ICD-10-CM | POA: Diagnosis not present

## 2021-12-01 DIAGNOSIS — J9601 Acute respiratory failure with hypoxia: Secondary | ICD-10-CM | POA: Diagnosis not present

## 2021-12-01 DIAGNOSIS — Z66 Do not resuscitate: Secondary | ICD-10-CM

## 2021-12-01 DIAGNOSIS — K5900 Constipation, unspecified: Secondary | ICD-10-CM | POA: Diagnosis not present

## 2021-12-01 DIAGNOSIS — I3131 Malignant pericardial effusion in diseases classified elsewhere: Secondary | ICD-10-CM

## 2021-12-01 DIAGNOSIS — C799 Secondary malignant neoplasm of unspecified site: Secondary | ICD-10-CM | POA: Diagnosis not present

## 2021-12-01 DIAGNOSIS — Z515 Encounter for palliative care: Secondary | ICD-10-CM

## 2021-12-01 LAB — COMPREHENSIVE METABOLIC PANEL
ALT: 56 U/L — ABNORMAL HIGH (ref 0–44)
AST: 81 U/L — ABNORMAL HIGH (ref 15–41)
Albumin: 2.8 g/dL — ABNORMAL LOW (ref 3.5–5.0)
Alkaline Phosphatase: 152 U/L — ABNORMAL HIGH (ref 38–126)
Anion gap: 10 (ref 5–15)
BUN: 28 mg/dL — ABNORMAL HIGH (ref 8–23)
CO2: 33 mmol/L — ABNORMAL HIGH (ref 22–32)
Calcium: 9.4 mg/dL (ref 8.9–10.3)
Chloride: 94 mmol/L — ABNORMAL LOW (ref 98–111)
Creatinine, Ser: 0.73 mg/dL (ref 0.44–1.00)
GFR, Estimated: >60 mL/min (ref >60)
Glucose, Bld: 95 mg/dL (ref 70–99)
Potassium: 5.1 mmol/L (ref 3.5–5.1)
Sodium: 137 mmol/L (ref 135–145)
Total Bilirubin: 0.3 mg/dL (ref 0.3–1.2)
Total Protein: 6.6 g/dL (ref 6.5–8.1)

## 2021-12-01 LAB — CBC
HCT: 46 % (ref 36.0–46.0)
Hemoglobin: 14.6 g/dL (ref 12.0–15.0)
MCH: 29.5 pg (ref 26.0–34.0)
MCHC: 31.7 g/dL (ref 30.0–36.0)
MCV: 92.9 fL (ref 80.0–100.0)
Platelets: 625 10*3/uL — ABNORMAL HIGH (ref 150–400)
RBC: 4.95 MIL/uL (ref 3.87–5.11)
RDW: 13.2 % (ref 11.5–15.5)
WBC: 17.2 10*3/uL — ABNORMAL HIGH (ref 4.0–10.5)
nRBC: 0 % (ref 0.0–0.2)

## 2021-12-01 LAB — PROTIME-INR
INR: 0.8 (ref 0.8–1.2)
Prothrombin Time: 11.3 seconds — ABNORMAL LOW (ref 11.4–15.2)

## 2021-12-01 LAB — ABO/RH: ABO/RH(D): O POS

## 2021-12-01 LAB — TSH: TSH: 9.704 u[IU]/mL — ABNORMAL HIGH (ref 0.350–4.500)

## 2021-12-01 LAB — CYTOLOGY - NON PAP

## 2021-12-01 LAB — APTT: aPTT: 24 seconds (ref 24–36)

## 2021-12-01 LAB — PREPARE RBC (CROSSMATCH)

## 2021-12-01 MED ORDER — BENZONATATE 100 MG PO CAPS
200.0000 mg | ORAL_CAPSULE | Freq: Three times a day (TID) | ORAL | Status: DC
  Administered 2021-12-01 – 2021-12-04 (×9): 200 mg via ORAL
  Filled 2021-12-01 (×11): qty 2

## 2021-12-01 MED ORDER — CEFAZOLIN SODIUM-DEXTROSE 2-4 GM/100ML-% IV SOLN
2.0000 g | INTRAVENOUS | Status: AC
  Administered 2021-12-02: 2 g via INTRAVENOUS
  Filled 2021-12-01 (×2): qty 100

## 2021-12-01 MED ORDER — ENSURE MAX PROTEIN PO LIQD
11.0000 [oz_av] | Freq: Every day | ORAL | Status: DC
  Administered 2021-12-03: 11 [oz_av] via ORAL
  Filled 2021-12-01 (×3): qty 330

## 2021-12-01 MED ORDER — BISACODYL 10 MG RE SUPP
10.0000 mg | Freq: Once | RECTAL | Status: AC
  Administered 2021-12-01: 10 mg via RECTAL
  Filled 2021-12-01: qty 1

## 2021-12-01 MED ORDER — SENNOSIDES-DOCUSATE SODIUM 8.6-50 MG PO TABS
2.0000 | ORAL_TABLET | Freq: Two times a day (BID) | ORAL | Status: DC
  Administered 2021-12-01 – 2021-12-05 (×5): 2 via ORAL
  Filled 2021-12-01 (×5): qty 2

## 2021-12-01 MED ORDER — LACTULOSE 10 GM/15ML PO SOLN
30.0000 g | Freq: Every day | ORAL | Status: DC | PRN
  Administered 2021-12-01: 30 g via ORAL
  Filled 2021-12-01: qty 45

## 2021-12-01 MED ORDER — HYDROCOD POLI-CHLORPHE POLI ER 10-8 MG/5ML PO SUER
5.0000 mL | Freq: Two times a day (BID) | ORAL | Status: DC | PRN
  Administered 2021-12-02 – 2021-12-06 (×6): 5 mL via ORAL
  Filled 2021-12-01 (×6): qty 5

## 2021-12-01 NOTE — TOC Progression Note (Signed)
Transition of Care (TOC) - Progression Note  ? ? ?Patient Details  ?Name: Carly Abbott ?MRN: [id number] ?Date of Birth: [birth year] ? ?Transition of Care (TOC) CM/SW Contact  ?Zenon Mayo, RN ?Phone Number: ?12/01/2021, 1:12 PM ? ?Clinical Narrative:    ?Patient is from home, has pericardial effusion, plan for pericardial window tomorrow, has pluerx drains in, a case has been ordered by Network engineer.  Patient is set up with [facility] for [facility].  TOC will continue to follow for dc needs.  ? ? ?  ?  ? ?Expected Discharge Plan and Services ?  ?  ?  ?  ?  ?                ?  ?  ?  ?  ?  ?  ?  ?  ?  ?  ? ? ?Social Determinants of Health (SDOH) Interventions ?  ? ?Readmission Risk Interventions ?   ? View : No data to display.  ?  ?  ?  ? ? ?

## 2021-12-01 NOTE — Significant Event (Signed)
Patient labored breathing  with pain gave PRN. ?

## 2021-12-01 NOTE — Progress Notes (Signed)
Initial Nutrition Assessment ? ?DOCUMENTATION CODES:  ? ?Not applicable ? ?INTERVENTION:  ?Continue Ensure Enlive po TID, each supplement provides 350 kcal and 20 grams of protein. ? ?Provide Ensure Max po once daily, each supplement provides 150 kcal and 30 grams of protein.  ? ?Encourage adequate PO intake.  ? ?NUTRITION DIAGNOSIS:  ? ?Increased nutrient needs related to chronic illness (Metastatic adenocarcinoma) as evidenced by estimated needs. ? ?GOAL:  ? ?Patient will meet greater than or equal to 90% of their needs ? ?MONITOR:  ? ?PO intake, Supplement acceptance, Weight trends, Labs, Skin, I & O's ? ?REASON FOR ASSESSMENT:  ? ?Malnutrition Screening Tool ?  ? ?ASSESSMENT:  ? ?66 y.o. female with medical history significant of depression, anxiety, MS, HTN. Pt transferred from [facility] to [facility] for pericardial window. Echo shows large pericardial effusion. 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 CT revealed PNA. ? ?Plans for pericardial window tomorrow. Pt unavailable during attempted time of contact. Meal completion has been 25%. Pt currently has Ensure Enlive and Ensure Max ordered and has been consuming them. RD to continue with current orders to aid in caloric and protein needs. Unable to complete Nutrition-Focused physical exam at this time.  ? ?Labs and medications reviewed.  ? ?Diet Order:   ?Diet Order   ? ?       ?  Diet NPO time specified  Diet effective midnight       ?  ?  Diet Heart Room service appropriate? Yes; Fluid consistency: Thin  Diet effective now       ?  ? ?  ?  ? ?  ? ? ?EDUCATION NEEDS:  ? ?Not appropriate for education at this time ? ?Skin:  Skin Assessment: Skin Integrity Issues: ?Skin Integrity Issues:: Incisions ?Incisions: back ? ?Last BM:  5/9 ? ?Height:  ? ?Ht Readings from Last 1 Encounters:  ?11/19/2021 4\' 10"  (1.473 m)  ? ? ?Weight:  ? ?Wt Readings from Last 1 Encounters:  ?12/01/21 41.6 kg  ? ?BMI:  Body mass index is 19.17 kg/m?. ? ?Estimated Nutritional Needs:  ? ?Kcal:   1550-1750 ? ?Protein:  80-90 grams ? ?Fluid:  >/= 1.5 L/day ? ?Corrin Parker, MS, RD, LDN ?RD pager number/after hours weekend pager number on Amion. ? ?

## 2021-12-01 NOTE — Progress Notes (Addendum)
? ?[facility] ?PROGRESS NOTE ? ? ?Carly Abbott ZOX:[id number] DOB: 18-Jun-1956 DOA: 12/09/2021 ? ?PCP: Dion Body, MD ? ?Brief History/Interval Summary: 66 y.o. female with medical history significant of depression, anxiety, MS, HTN. ?  ?Pt with recent admit also at [facility] for SOB, pleural effusion on 5/4.  Effusion drained but rapidly re-occurred and pt readmitted to [facility] 5/6. ?  ?Summary of stay at [facility]: ?  ?5/7: requiring low dose morphine to control dyspnea. ?  ?5/8: Pleurx catheter placed by interventional radiology. ?  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  Dr. Tasia Catchings was reconsulted. ?  ?11/26/2021 CT scan of the chest showed no evidence of pulmonary embolism or thoracic dissection.  Showed a moderate to large pericardial effusion and small bilateral pleural effusions.  Infiltrate seen in both lungs and COPD seen.  Patient was started on Rocephin and Zithromax on 11/27/2021. ?  ?Echocardiogram resulted on 11/28/2021 showed a moderate pericardial effusion with normal ejection fraction and no signs of pericardial tamponade.   ?  ?Cardiology on the evening of 5/12 recommended cardiothoracic surgery consultation for pericardial window to reduce the amount of pericardial effusion and reduce the potential risk of pericardial tamponade.  Case discussed with covering oncology on 11/29/2021 and recommended speaking with cardiothoracic surgery.  Hospitalist at [facility] spoke with cardio thoracic surgery Dr. Prescott Gum who reviewed viewed the patient's studies and recommended transfer to [facility] for pericardial window. ?  ?Stay also complicated by constipation demonstrated on CT scan.  Some question of gastric outlet obstruction but pt without N/V.  Pt started on laxatives for constipation. ? ? ?Consultants:  ?Medical oncology at [facility]: Dr. Tasia Catchings.   ?Interventional radiology at [facility]  ?Cardiology at [facility] Dr. Nehemiah Massed. ?Cardiothoracic surgery here at  [facility]: Dr. Darcey Nora ?Palliative care at [facility] ? ?Procedures: None yet ? ? ? ?Subjective/Interval History: ?Complains of nonproductive cough which exacerbates her chest pain.  Denies any worsening in her shortness of breath.  No nausea vomiting.  Her cousin is at the bedside.   ? ? ? ?Assessment/Plan: ? ?Pericardial effusion ?Echo shows large pericardial effusion but no tamponade physiology at this time. Pt transferred from [facility] to [facility] for pericardial window. ?Patient seen by Dr. Darcey Nora.  Plan is for pericardial window on 5/16.  ?Effusion most likely malignant in etiology. ?  ?Acute respiratory failure with hypoxia ([place]) ?4L O2 requirement down from 6L on admission to [facility]. ?Due to PNA + cancer. ?Will likely need home oxygen at discharge. ?  ?Metastatic adenocarcinoma ([place]) ?Pt with malignant pleural effusion on cytology.  Likely lung origin.  By Dr. Tasia Catchings with medical oncology at [facility].  Will need outpatient follow-up with him after discharge. ?Per last note by Dr. Tasia Catchings, prognosis is thought to be poor.  Waiting on further studies to see if she will be a candidate for immunotherapy.  Patient told him that she was leaning towards no chemotherapy.  Could be a candidate for hospice going forward but will depend on results of further test that were ordered. ?Will order cough suppressant since cough is exacerbating her pain. ? ?Hypotension ?Pt had tachycardia and hypotension during stay at [facility], started on midodrine.  Blood pressures have improved.  Continue to monitor. ?  ?Community-acquired pneumonia ?CT at [facility] showing PNA.  Was placed on ceftriaxone and azithromycin.  Will complete course here.  WBC noted to be 17.2 today.  She was getting prednisone which could be the reason for her  leukocytosis.  She is afebrile. ?  ?Pleural effusion on right ?Patient underwent thoracentesis on 5/5 with 1.2 L fluid removed.  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  ?IR placed a Pleurx drainage catheter on 5/8. ?Experiencing  significant pain and discomfort.  Was started on OxyContin 10 mg twice a day and oxycodone as needed.  Will order cough suppressants. ?Drainage per the Plurex order set.  Drained last night. ?  ?Constipation ?Acute constipation noted on CT. started on aggressive bowel regimen.  There was some question of gastric outlet obstruction but patient without any symptoms.  Continue to monitor for now.  Constipation likely due to pain medications.   ?TSH noted to be elevated at 9.7.  Patient mentions that she has a longstanding history of subclinical hypothyroidism.  Will check free T4 tomorrow.   ?No bowel movement yet.  Will increase the doses of her laxatives.  Give a suppository.   ?  ?Abnormal LFTs ?Mildly elevated AST and ALT level noted.  Only done CT scan did not show any liver lesions.  Continue to trend for now. ? ?Protein-calorie malnutrition, severe ?In setting of metastatic CA ?  ?Anxiety and depression ?Continue zoloft and valium. ? ?History of multiple sclerosis ?Stable.  Uses Flexeril at home. ? ?Goals of care ?She was being seen by palliative care at [facility].  Noted to be DNR. We will also request palliative care to continue to follow her here in the hospital. ?  ?  ? ?DVT Prophylaxis: Lovenox ?Code Status: DNR.  DNR will be rescinded at the time of surgery as she will need to be intubated for the pericardial window. ?Family Communication: Discussed with patient ?Disposition Plan: To be determined. Was seen by PT and OT a few days ago and home health was recommended at that time. ? ?Status is: Inpatient ?Remains inpatient appropriate because: Pericardial effusion requiring pericardial window ? ? ? ? ? ?Medications: Scheduled: ? benzonatate  200 mg Oral TID  ? cyclobenzaprine  10 mg Oral BID  ? enoxaparin (LOVENOX) injection  30 mg Subcutaneous Q24H  ? feeding supplement  237 mL Oral TID BM  ? methocarbamol  500 mg Oral Q8H  ? midodrine  5 mg Oral BID WC  ? oxybutynin  5 mg Oral BID  ? oxyCODONE  10 mg Oral Q12H   ? polyethylene glycol  17 g Oral BID  ? Ensure Max Protein  11 oz Oral BID  ? senna-docusate  2 tablet Oral BID  ? sertraline  50 mg Oral Daily  ? ?Continuous: ?  ceFAZolin (ANCEF) IV    ? cefTRIAXone (ROCEPHIN) IVPB 2 gram/100 mL NS (Mini-Bag Plus) 2 g (12/01/21 1100)  ? ?GGY:IRSWNIOEVOJJK **OR** acetaminophen, chlorpheniramine-HYDROcodone, diazepam, lactulose, morphine (PF), ondansetron **OR** ondansetron (ZOFRAN) IV, oxyCODONE ? ?Antibiotics: ?Anti-infectives (From admission, onward)  ? ? Start     Dose/Rate Route Frequency Ordered Stop  ? 12/01/21 0801  ceFAZolin (ANCEF) IVPB 2g/100 mL premix       ? 2 g ?200 mL/hr over 30 Minutes Intravenous 30 min pre-op 12/01/21 0801    ? 11/17/2021 1000  azithromycin (ZITHROMAX) tablet 250 mg       ? 250 mg Oral Daily 12/14/2021 0122 12/01/21 1049  ? 12/07/2021 1000  cefTRIAXone (ROCEPHIN) 2 g in sodium chloride 0.9 % 100 mL IVPB       ? 2 g ?200 mL/hr over 30 Minutes Intravenous Every 24 hours 12/11/2021 0122 11/26/2021 0959  ? ?  ? ? ?Objective: ? ?Vital Signs ? ?  Vitals:  ? 12/13/2021 1203 12/07/2021 2000 12/01/21 0432 12/01/21 0725  ?BP: 122/64 137/76 (!) 153/87 (!) 118/97  ?Pulse: (!) 104 (!) 108 (!) 102 (!) 107  ?Resp: 20 18 20    ?Temp:  98 ?F (36.7 ?C) 98.2 ?F (36.8 ?C) 98.1 ?F (36.7 ?C)  ?TempSrc:  Oral Oral Oral  ?SpO2: 93% 94% 96% 92%  ?Weight:   41.6 kg   ?Height:      ? ? ?Intake/Output Summary (Last 24 hours) at 12/01/2021 1120 ?Last data filed at 12/01/2021 0845 ?Gross per 24 hour  ?Intake 100 ml  ?Output 950 ml  ?Net -850 ml  ? ? ?Filed Weights  ? 11/28/2021 0155 12/01/21 0432  ?Weight: 42.9 kg 41.6 kg  ? ? ?General appearance: Awake alert.  In no distress ?Resp: Noted to be tachypneic.  Diminished air entry at the bases bilaterally.  Pleurx catheter noted on the right. ?Cardio: S1-S2 is normal regular.  No S3-S4.  No rubs murmurs or bruit ?GI: Abdomen is soft.  Nontender nondistended.  Bowel sounds are present normal.  No masses organomegaly ?Extremities: No edema.  Full range  of motion of lower extremities. ?Neurologic: Alert and oriented x3.  No focal neurological deficits.  ? ? ?Lab Results: ? ?Data Reviewed: I have personally reviewed following labs and reports of the imagi

## 2021-12-01 NOTE — Consult Note (Signed)
Consultation Note Date: 12/01/2021   Patient Name: Carly Abbott  DOB: [birth year]  MRN: [id number]  Age / Sex: 66 y.o., female  PCP: Dion Body, MD Referring Physician: Bonnielee Haff, MD  Reason for Consultation: Establishing goals of care and Psychosocial/spiritual support  HPI/Patient Profile: 66 y.o. female   admitted on 11/29/2021 with past medical history significant for depression with anxiety,[place]ane multiple sclerosis, vestibular schwannoma, hypertension, and hyperlipidemia and recent admission for shortness of breath and found to have a right pleural effusion, significant for positive pathology for adenocarcinoma with suspected lung origin   Pt with recent admit also at [facility] for SOB, pleural effusion on 5/4.  Effusion drained but rapidly re-occurred and pt readmitted to [facility] 5/6.   Summary of stay at [facility]:   5/7: requiring low dose morphine to control dyspnea.   5/8: Pleurx catheter placed by interventional radiology.   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  Dr. Tasia Catchings was reconsulted.   11/26/2021 CT scan of the chest showed no evidence of pulmonary embolism or thoracic dissection.  Showed a moderate to large pericardial effusion and small bilateral pleural effusions.  Infiltrate seen in both lungs and COPD seen.  Patient was started on Rocephin and Zithromax on 11/27/2021. Oncology consult note/Dr.Yu reflects likely overall poor prognosis, ongoing conversation regarding treatment options.   Echocardiogram resulted on 11/28/2021 showed a moderate pericardial effusion with normal ejection fraction and no signs of pericardial tamponade.     Cardiology on the evening of 5/12 recommended cardiothoracic surgery consultation for pericardial window to reduce the amount of pericardial effusion and reduce the potential risk of  pericardial tamponade.    Case discussed with covering oncology on 11/29/2021 and recommended speaking with cardiothoracic surgery.    Hospital ist at [facility] spoke with cardio thoracic surgery Dr. Prescott Gum who reviewed viewed the patient's studies and recommended transfer to [facility] for pericardial window.  Currently at [place].  TEE scheduled for tomorrow  Patient and her family face treatment option decisions, advanced directives decisions and anticipatory care needs.     Clinical Assessment and Goals of Care:  This NP Wadie Lessen reviewed medical records, received report from team, assessed the patient and then meet at the patient's bedside along with her cousin Juliann Pulse to discuss diagnosis, prognosis, GOC, disposition and options.   Concept of Palliative Care was introduced as specialized medical care for people and their families living with serious illness.  If focuses on providing relief from the symptoms and stress of a serious illness.  The goal is to improve quality of life for both the patient and the family. Values and goals of care important to patient and family were attempted to be elicited.  Created space and opportunity for patient  and family to explore thoughts and feelings regarding current medical situation.  Patient verbalizes an understanding of the seriousness of her current medical situation.  Patient is a retired Human resources officer nurse/worked in hospice.  She understands her cancer diagnosis  and her limited treatment options secondary to her frailty.  She is leaning towards a more comfort approach to care.  She verbalizes that ultimately she would like to be in her own home at end-of-life with hospice care in order to be around all of her pets.  She has multiple cats and a dog.   She has always loved animals and gardening.   Education offered today regarding advanced directives.  Concepts specific to code status, artifical feeding and hydration, continued IV antibiotics  and rehospitalization was had.  The difference between a aggressive medical intervention path  and an intensive comfort care path for this patient at this time was had.    Education offered on hospice benefit;  philosophy and eligibility both in the home and residential.   MOST form introduced    Questions and concerns addressed.  Patient  encouraged to call with questions or concerns.     PMT will continue to support holistically.      HCPOA/ brother/ Sylvie Farrier    Recommended family meeting with patient, her brother/H POA and other support persons in the next 24 to 48 hours for continued conversation regarding treatment plan    SUMMARY OF RECOMMENDATIONS    Code Status/Advance Care Planning: DNR    Palliative Prophylaxis:  Aspiration, Bowel Regimen, Delirium Protocol, Frequent Pain Assessment, and Oral Care  Additional Recommendations (Limitations, Scope, Preferences): Avoid Hospitalization, No Artificial Feeding, and No Surgical Procedures  Psycho-social/Spiritual:  Desire for further Chaplaincy support:no Additional Recommendations: Education on Hospice  Prognosis:  Unable to determine  Discharge Planning: To Be Determined      Primary Diagnoses: Present on Admission:  Pericardial effusion  Metastatic adenocarcinoma (HCC)  Pleural effusion on right  Acute respiratory failure with hypoxia (HCC)  Protein-calorie malnutrition, severe  SIRS (systemic inflammatory response syndrome) (HCC)  Constipation  Anxiety and depression  Hypotension   I have reviewed the medical record, interviewed the patient and family, and examined the patient. The following aspects are pertinent.  Past Medical History:  Diagnosis Date   Depression    Glaucoma    Headache    Hyperlipidemia    Multiple sclerosis (Carly Abbott)    Spinal stenosis    Social History   Socioeconomic History   Marital status: Divorced    Spouse name: Not on file   Number of children: Not on file   Years  of education: Not on file   Highest education level: Not on file  Occupational History   Not on file  Tobacco Use   Smoking status: Never   Smokeless tobacco: Never  Substance and Sexual Activity   Alcohol use: No   Drug use: No   Sexual activity: Not Currently  Other Topics Concern   Not on file  Social History Narrative   Not on file   Social Determinants of Health   Financial Resource Strain: Not on file  Food Insecurity: Not on file  Transportation Needs: Not on file  Physical Activity: Not on file  Stress: Not on file  Social Connections: Not on file   Family History  Problem Relation Age of Onset   Colon cancer Mother    Hypertension Father    Rheum arthritis Brother    Diabetes Brother    Breast cancer Neg Hx    Scheduled Meds:  benzonatate  200 mg Oral TID   cyclobenzaprine  10 mg Oral BID   enoxaparin (LOVENOX) injection  30 mg Subcutaneous Q24H   feeding supplement  237  mL Oral TID BM   methocarbamol  500 mg Oral Q8H   midodrine  5 mg Oral BID WC   oxybutynin  5 mg Oral BID   oxyCODONE  10 mg Oral Q12H   polyethylene glycol  17 g Oral BID   Ensure Max Protein  11 oz Oral BID   senna-docusate  2 tablet Oral BID   sertraline  50 mg Oral Daily   Continuous Infusions:   ceFAZolin (ANCEF) IV     PRN Meds:.acetaminophen **OR** acetaminophen, chlorpheniramine-HYDROcodone, diazepam, lactulose, morphine (PF), ondansetron **OR** ondansetron (ZOFRAN) IV, oxyCODONE Medications Prior to Admission:  Prior to Admission medications   Medication Sig Start Date End Date Taking? Authorizing Provider  acetaminophen (TYLENOL) 500 MG tablet Take 2 tablets (1,000 mg total) by mouth every 8 (eight) hours. 11/29/21  Yes Wieting, Richard, MD  azithromycin (ZITHROMAX) 250 MG tablet Take 1 tablet (250 mg total) by mouth daily for 2 days. 12/03/2021 11/25/2021 Yes Wieting, Richard, MD  cefTRIAXone 2 g in sodium chloride 0.9 % 100 mL Inject 2 g into the vein daily for 2 days. 11/29/21  12/01/21 Yes Wieting, Richard, MD  cyclobenzaprine (FLEXERIL) 10 MG tablet Take 10 mg by mouth 2 (two) times daily.   Yes [provider]  diazepam (VALIUM) 10 MG tablet Take 1 tablet (10 mg total) by mouth every 8 (eight) hours as needed for anxiety. 11/29/21  Yes Wieting, Richard, MD  Ensure Max Protein (ENSURE MAX PROTEIN) LIQD Take 330 mLs (11 oz total) by mouth 2 (two) times daily. 11/29/21  Yes Wieting, Richard, MD  guaiFENesin (MUCINEX) 600 MG 12 hr tablet Take 1 tablet (600 mg total) by mouth 2 (two) times daily. 11/29/21  Yes Wieting, Richard, MD  lactulose (CHRONULAC) 10 GM/15ML solution Take 45 mLs (30 g total) by mouth daily as needed for mild constipation or severe constipation. 11/29/21  Yes Wieting, Richard, MD  latanoprost (XALATAN) 0.005 % ophthalmic solution Place 1 drop into both eyes at bedtime. 11/22/21  Yes Fritzi Mandes, MD  lidocaine (LIDODERM) 5 % Place 1 patch onto the skin daily. Remove & Discard patch within 12 hours or as directed by MD 11/29/21  Yes Loletha Grayer, MD  methocarbamol (ROBAXIN) 500 MG tablet Take 1 tablet (500 mg total) by mouth every 8 (eight) hours. 11/29/21  Yes Wieting, Richard, MD  midodrine (PROAMATINE) 5 MG tablet Take 1 tablet (5 mg total) by mouth 2 (two) times daily with a meal. 11/29/21  Yes Wieting, Richard, MD  Multiple Vitamin (MULTIVITAMIN WITH MINERALS) TABS tablet Take 1 tablet by mouth daily. 11/23/21  Yes Fritzi Mandes, MD  oxybutynin (DITROPAN) 5 MG tablet Take 1 tablet (5 mg total) by mouth 2 (two) times daily. 11/29/21  Yes Wieting, Richard, MD  polyethylene glycol (MIRALAX / GLYCOLAX) 17 g packet Take 17 g by mouth 2 (two) times daily. 11/29/21  Yes Wieting, Richard, MD  senna-docusate (SENOKOT-S) 8.6-50 MG tablet Take 1 tablet by mouth 2 (two) times daily. 11/29/21  Yes Wieting, Richard, MD  sertraline (ZOLOFT) 50 MG tablet Take 1 tablet (50 mg total) by mouth daily. 12/16/2021  Yes Wieting, Richard, MD  feeding supplement (ENSURE ENLIVE /  ENSURE PLUS) LIQD Take 237 mLs by mouth 3 (three) times daily between meals. 11/29/21   Loletha Grayer, MD  Morphine Sulfate (MORPHINE, PF,) 2 MG/ML injection Inject 0.5-1 mLs (1-2 mg total) into the vein every 3 (three) hours as needed (dyspnea). 11/29/21   Loletha Grayer, MD  oxyCODONE (OXYCONTIN) 10 mg  12 hr tablet Take 1 tablet (10 mg total) by mouth every 12 (twelve) hours. 11/29/21   Loletha Grayer, MD  oxyCODONE 10 MG TABS Take 1 tablet (10 mg total) by mouth every 4 (four) hours as needed for moderate pain or breakthrough pain. 11/29/21   Loletha Grayer, MD  predniSONE (DELTASONE) 20 MG tablet Take 2 tablets (40 mg total) by mouth daily with breakfast for 2 days. 12/06/2021 11/24/2021  Loletha Grayer, MD   Allergies  Allergen Reactions   Cardizem [Diltiazem Hcl] Itching   Other Hives    TECFREDA    Review of Systems  Constitutional:  Positive for fatigue and unexpected weight change.   Physical Exam Constitutional:      Appearance: She is cachectic. She is ill-appearing.  Cardiovascular:     Rate and Rhythm: Normal rate.  Pulmonary:     Effort: Pulmonary effort is normal.  Musculoskeletal:     Comments: -Generalized weakness and muscle atrophy  Skin:    General: Skin is warm and dry.  Neurological:     Mental Status: She is alert and oriented to person, place, and time.    Vital Signs: BP 119/67 (BP Location: Left Arm)   Pulse (!) 102   Temp 97.8 F (36.6 C) (Oral)   Resp 20   Ht 4\' 10"  (1.473 m)   Wt 41.6 kg   SpO2 97%   BMI 19.17 kg/m  Pain Scale: 0-10   Pain Score: 7    SpO2: SpO2: 97 % O2 Device:SpO2: 97 % O2 Flow Rate: .O2 Flow Rate (L/min): 4 L/min  IO: Intake/output summary:  Intake/Output Summary (Last 24 hours) at 12/01/2021 1455 Last data filed at 12/01/2021 0845 Gross per 24 hour  Intake 100 ml  Output 950 ml  Net -850 ml    LBM: Last BM Date : 11/25/21 Baseline Weight: Weight: 42.9 kg Most recent weight: Weight: 41.6 kg     Palliative  Assessment/Data: 30 % at best    Discussed with Dr Clide Deutscher   Signed by: Wadie Lessen, NP   Please contact Palliative Medicine Team phone at [phone] for questions and concerns.  For individual provider: See Shea Evans

## 2021-12-01 NOTE — Progress Notes (Signed)
Procedure(s) (LRB): ?SUBXYPHOID PERICARDIAL WINDOW (N/A) ?TRANSESOPHAGEAL ECHOCARDIOGRAM (TEE) (N/A) ?Subjective: ?Patient having some airway congestion and shortness of breath today ?Right Pleurx catheter was drained 250 cc last p.m. ?On exam she is moving air adequately and her saturation on 3 L is 94% ?Objective: ?Vital signs in last 24 hours: ?Temp:  [98 ?F (36.7 ?C)-98.2 ?F (36.8 ?C)] 98.1 ?F (36.7 ?C) (05/15 0725) ?Pulse Rate:  [102-108] 107 (05/15 0725) ?Cardiac Rhythm: Sinus tachycardia (05/15 0700) ?Resp:  [18-20] 20 (05/15 0432) ?BP: (118-153)/(64-97) 118/97 (05/15 0725) ?SpO2:  [92 %-96 %] 92 % (05/15 0725) ?Weight:  [41.6 kg] 41.6 kg (05/15 0432) ? ?Hemodynamic parameters for last 24 hours: ?  ?Stable ?Intake/Output from previous day: ?05/14 0701 - 05/15 0700 ?In: 100 [IV Piggyback:100] ?Out: 650 [Urine:400; Drains:250] ?Intake/Output this shift: ?Total I/O ?In: -  ?Out: 300 [Urine:300] ? ?Exam ? ?Fragile chronically ill-appearing middle-aged female on oxygen ?Coarse breath sounds bilaterally ?No JVD ?No abdominal tenderness or guarding ?No ankle edema ?No focal neurodeficit ? ?Lab Results: ?Recent Labs  ?  12/01/21 ?0422  ?WBC 17.2*  ?HGB 14.6  ?HCT 46.0  ?PLT 625*  ? ?BMET:  ?Recent Labs  ?  12/01/21 ?0422  ?NA 137  ?K 5.1  ?CL 94*  ?CO2 33*  ?GLUCOSE 95  ?BUN 28*  ?CREATININE 0.73  ?CALCIUM 9.4  ?  ?PT/INR:  ?Recent Labs  ?  12/01/21 ?0422  ?LABPROT 11.3*  ?INR 0.8  ? ?ABG ?   ?Component Value Date/Time  ? HCO3 25.8 11/22/2021 1850  ? O2SAT 93.5 11/22/2021 1850  ? ?CBG (last 3)  ?No results for input(s): GLUCAP in the last 72 hours. ? 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  Patient understands  that the DNR will be rescinded for the day of surgery because she will be intubated for general anesthesia.  She understands we will try to get the endotracheal tube removed soon as possible postoperatively. ?She will need to be transferred to a more advanced care unit postoperatively and will remain in the hospital 3 to 4 days following procedure if everything goes well.  She agrees to proceed with this operation and understands the plan. ? LOS: 1 day  ? ? ?Dahlia Byes ?12/01/2021 ?  ?

## 2021-12-01 NOTE — Progress Notes (Signed)
Pt is currently in bathroom with family friend at the bedside.  ? ?

## 2021-12-01 NOTE — Progress Notes (Signed)
Mobility Specialist Progress Note: ? ? 12/01/21 1055  ?Mobility  ?Activity Ambulated with assistance in hallway  ?Level of Assistance Minimal assist, patient does 75% or more  ?Assistive Device Front wheel walker  ?Distance Ambulated (ft) 500 ft  ?Activity Response Tolerated well  ?$Mobility charge 1 Mobility  ? ?Pt eager for mobility session. Required minG-MinA at times d/t LOBs. Pt with increased weakness with distance. Required max cues to take rest breaks when needed. SpO2 >90% on 6LO2. States her breathing has improved and continues to improve with ambulation. Pt left in bed with bed alarm on, visitor present.  ? ?Carly Abbott ?Acute Rehab ?Secure Chat or ?Office Phone: [phone] ? ?

## 2021-12-02 ENCOUNTER — Encounter (HOSPITAL_COMMUNITY): Payer: Self-pay | Admitting: Internal Medicine

## 2021-12-02 ENCOUNTER — Inpatient Hospital Stay (HOSPITAL_COMMUNITY): Payer: Medicare Other

## 2021-12-02 ENCOUNTER — Inpatient Hospital Stay (HOSPITAL_COMMUNITY): Payer: Medicare Other | Admitting: Certified Registered Nurse Anesthetist

## 2021-12-02 ENCOUNTER — Encounter (HOSPITAL_COMMUNITY): Admission: AD | Disposition: E | Payer: Self-pay | Source: Other Acute Inpatient Hospital | Attending: Internal Medicine

## 2021-12-02 ENCOUNTER — Encounter: Payer: Self-pay | Admitting: *Deleted

## 2021-12-02 DIAGNOSIS — G35 Multiple sclerosis: Secondary | ICD-10-CM | POA: Diagnosis not present

## 2021-12-02 DIAGNOSIS — E43 Unspecified severe protein-calorie malnutrition: Secondary | ICD-10-CM | POA: Diagnosis not present

## 2021-12-02 DIAGNOSIS — I3139 Other pericardial effusion (noninflammatory): Secondary | ICD-10-CM

## 2021-12-02 DIAGNOSIS — J189 Pneumonia, unspecified organism: Secondary | ICD-10-CM | POA: Diagnosis not present

## 2021-12-02 DIAGNOSIS — F418 Other specified anxiety disorders: Secondary | ICD-10-CM | POA: Diagnosis not present

## 2021-12-02 DIAGNOSIS — C3490 Malignant neoplasm of unspecified part of unspecified bronchus or lung: Secondary | ICD-10-CM

## 2021-12-02 DIAGNOSIS — R0602 Shortness of breath: Secondary | ICD-10-CM

## 2021-12-02 DIAGNOSIS — J9601 Acute respiratory failure with hypoxia: Secondary | ICD-10-CM | POA: Diagnosis not present

## 2021-12-02 DIAGNOSIS — R531 Weakness: Secondary | ICD-10-CM

## 2021-12-02 DIAGNOSIS — C799 Secondary malignant neoplasm of unspecified site: Secondary | ICD-10-CM | POA: Diagnosis not present

## 2021-12-02 DIAGNOSIS — K5900 Constipation, unspecified: Secondary | ICD-10-CM | POA: Diagnosis not present

## 2021-12-02 HISTORY — PX: TEE WITHOUT CARDIOVERSION: SHX5443

## 2021-12-02 LAB — POCT I-STAT 7, (LYTES, BLD GAS, ICA,H+H)
Acid-Base Excess: 2 mmol/L (ref 0.0–2.0)
Bicarbonate: 30.3 mmol/L — ABNORMAL HIGH (ref 20.0–28.0)
Calcium, Ion: 1.21 mmol/L (ref 1.15–1.40)
HCT: 42 % (ref 36.0–46.0)
Hemoglobin: 14.3 g/dL (ref 12.0–15.0)
O2 Saturation: 90 %
Potassium: 4.4 mmol/L (ref 3.5–5.1)
Sodium: 135 mmol/L (ref 135–145)
TCO2: 32 mmol/L (ref 22–32)
pCO2 arterial: 60 mmHg — ABNORMAL HIGH (ref 32–48)
pH, Arterial: 7.312 — ABNORMAL LOW (ref 7.35–7.45)
pO2, Arterial: 65 mmHg — ABNORMAL LOW (ref 83–108)

## 2021-12-02 LAB — T3: T3, Total: 87 ng/dL (ref 71–180)

## 2021-12-02 LAB — T4: T4, Total: 6.1 ug/dL (ref 4.5–12.0)

## 2021-12-02 LAB — T4, FREE: Free T4: 0.94 ng/dL (ref 0.61–1.12)

## 2021-12-02 SURGERY — ECHOCARDIOGRAM, TRANSESOPHAGEAL
Anesthesia: General | Site: Esophagus | Laterality: Right

## 2021-12-02 MED ORDER — EPHEDRINE SULFATE-NACL 50-0.9 MG/10ML-% IV SOSY
PREFILLED_SYRINGE | INTRAVENOUS | Status: DC | PRN
  Administered 2021-12-02 (×2): 5 mg via INTRAVENOUS

## 2021-12-02 MED ORDER — LACTULOSE 10 GM/15ML PO SOLN
30.0000 g | Freq: Two times a day (BID) | ORAL | Status: DC
  Administered 2021-12-03 – 2021-12-05 (×4): 30 g via ORAL
  Filled 2021-12-02 (×4): qty 45

## 2021-12-02 MED ORDER — ONDANSETRON HCL 4 MG/2ML IJ SOLN: 4.0000 mg | Freq: Once | INTRAMUSCULAR | Status: DC | PRN

## 2021-12-02 MED ORDER — KETAMINE HCL 50 MG/5ML IJ SOSY
PREFILLED_SYRINGE | INTRAMUSCULAR | Status: AC
  Filled 2021-12-02: qty 5

## 2021-12-02 MED ORDER — MIDODRINE HCL 5 MG PO TABS
2.5000 mg | ORAL_TABLET | Freq: Two times a day (BID) | ORAL | Status: DC
  Administered 2021-12-03 (×2): 2.5 mg via ORAL
  Filled 2021-12-02 (×3): qty 1

## 2021-12-02 MED ORDER — ROCURONIUM BROMIDE 10 MG/ML (PF) SYRINGE
PREFILLED_SYRINGE | INTRAVENOUS | Status: DC | PRN
Start: 2021-12-02 — End: 2021-12-02
  Administered 2021-12-02: 40 mg via INTRAVENOUS

## 2021-12-02 MED ORDER — PHENYLEPHRINE 80 MCG/ML (10ML) SYRINGE FOR IV PUSH (FOR BLOOD PRESSURE SUPPORT)
PREFILLED_SYRINGE | INTRAVENOUS | Status: DC | PRN
  Administered 2021-12-02: 80 ug via INTRAVENOUS
  Administered 2021-12-02: 160 ug via INTRAVENOUS
  Administered 2021-12-02: 120 ug via INTRAVENOUS
  Administered 2021-12-02: 160 ug via INTRAVENOUS

## 2021-12-02 MED ORDER — FENTANYL CITRATE (PF) 250 MCG/5ML IJ SOLN
INTRAMUSCULAR | Status: AC
  Filled 2021-12-02: qty 5

## 2021-12-02 MED ORDER — CHLORHEXIDINE GLUCONATE 0.12 % MT SOLN: 15.0000 mL | Freq: Once | OROMUCOSAL | Status: AC

## 2021-12-02 MED ORDER — BUPIVACAINE HCL 0.5 % IJ SOLN
INTRAMUSCULAR | Status: DC | PRN
  Administered 2021-12-02: 30 mL

## 2021-12-02 MED ORDER — SUGAMMADEX SODIUM 200 MG/2ML IV SOLN
INTRAVENOUS | Status: DC | PRN
  Administered 2021-12-02: 200 mg via INTRAVENOUS

## 2021-12-02 MED ORDER — ORAL CARE MOUTH RINSE: 15.0000 mL | Freq: Once | OROMUCOSAL | Status: AC

## 2021-12-02 MED ORDER — BUPIVACAINE LIPOSOME 1.3 % IJ SUSP
INTRAMUSCULAR | Status: DC | PRN
  Administered 2021-12-02: 20 mL

## 2021-12-02 MED ORDER — DEXAMETHASONE SODIUM PHOSPHATE 10 MG/ML IJ SOLN
INTRAMUSCULAR | Status: DC | PRN
  Administered 2021-12-02: 10 mg via INTRAVENOUS

## 2021-12-02 MED ORDER — CHLORHEXIDINE GLUCONATE 0.12 % MT SOLN
OROMUCOSAL | Status: AC
  Administered 2021-12-02: 15 mL via OROMUCOSAL
  Filled 2021-12-02: qty 15

## 2021-12-02 MED ORDER — CHLORHEXIDINE GLUCONATE CLOTH 2 % EX PADS
6.0000 | MEDICATED_PAD | Freq: Every day | CUTANEOUS | Status: DC
  Administered 2021-12-02 – 2021-12-06 (×4): 6 via TOPICAL

## 2021-12-02 MED ORDER — LACTATED RINGERS IV SOLN: INTRAVENOUS | Status: DC

## 2021-12-02 MED ORDER — ACETAMINOPHEN 500 MG PO TABS: 1000.0000 mg | ORAL_TABLET | Freq: Once | ORAL | Status: DC

## 2021-12-02 MED ORDER — ONDANSETRON HCL 4 MG/2ML IJ SOLN
INTRAMUSCULAR | Status: DC | PRN
  Administered 2021-12-02: 4 mg via INTRAVENOUS

## 2021-12-02 MED ORDER — FENTANYL CITRATE (PF) 250 MCG/5ML IJ SOLN
INTRAMUSCULAR | Status: DC | PRN
  Administered 2021-12-02: 50 ug via INTRAVENOUS

## 2021-12-02 MED ORDER — BUPIVACAINE LIPOSOME 1.3 % IJ SUSP
INTRAMUSCULAR | Status: AC
  Filled 2021-12-02: qty 20

## 2021-12-02 MED ORDER — MORPHINE SULFATE (PF) 2 MG/ML IV SOLN
2.0000 mg | INTRAVENOUS | Status: DC | PRN
  Administered 2021-12-02 (×2): 4 mg via INTRAVENOUS
  Administered 2021-12-02: 2 mg via INTRAVENOUS
  Administered 2021-12-03 – 2021-12-05 (×16): 4 mg via INTRAVENOUS
  Administered 2021-12-06: 2 mg via INTRAVENOUS
  Administered 2021-12-06 – 2021-12-07 (×8): 4 mg via INTRAVENOUS
  Filled 2021-12-02 (×13): qty 2
  Filled 2021-12-02: qty 1
  Filled 2021-12-02 (×7): qty 2
  Filled 2021-12-02: qty 1
  Filled 2021-12-02 (×4): qty 2
  Filled 2021-12-02: qty 1
  Filled 2021-12-02: qty 2
  Filled 2021-12-02: qty 1

## 2021-12-02 MED ORDER — 0.9 % SODIUM CHLORIDE (POUR BTL) OPTIME
TOPICAL | Status: DC | PRN
  Administered 2021-12-02: 1000 mL

## 2021-12-02 MED ORDER — SUCCINYLCHOLINE CHLORIDE 200 MG/10ML IV SOSY
PREFILLED_SYRINGE | INTRAVENOUS | Status: DC | PRN
  Administered 2021-12-02: 60 mg via INTRAVENOUS

## 2021-12-02 MED ORDER — LIP MEDEX EX OINT
TOPICAL_OINTMENT | CUTANEOUS | Status: DC | PRN
  Filled 2021-12-02: qty 7

## 2021-12-02 MED ORDER — BUPIVACAINE HCL (PF) 0.5 % IJ SOLN
INTRAMUSCULAR | Status: AC
  Filled 2021-12-02: qty 30

## 2021-12-02 MED ORDER — LIDOCAINE 2% (20 MG/ML) 5 ML SYRINGE
INTRAMUSCULAR | Status: DC | PRN
Start: 2021-12-02 — End: 2021-12-02
  Administered 2021-12-02: 50 mg via INTRAVENOUS

## 2021-12-02 MED ORDER — FENTANYL CITRATE (PF) 100 MCG/2ML IJ SOLN: 25.0000 ug | INTRAMUSCULAR | Status: DC | PRN

## 2021-12-02 MED ORDER — PROPOFOL 10 MG/ML IV BOLUS
INTRAVENOUS | Status: AC
  Filled 2021-12-02: qty 20

## 2021-12-02 MED ORDER — PHENYLEPHRINE HCL-NACL 20-0.9 MG/250ML-% IV SOLN
INTRAVENOUS | Status: DC | PRN
  Administered 2021-12-02: 40 ug/min via INTRAVENOUS

## 2021-12-02 MED ORDER — PROPOFOL 10 MG/ML IV BOLUS
INTRAVENOUS | Status: DC | PRN
Start: 2021-12-02 — End: 2021-12-02
  Administered 2021-12-02: 50 mg via INTRAVENOUS

## 2021-12-02 MED ORDER — FLEET ENEMA 7-19 GM/118ML RE ENEM: 1.0000 | ENEMA | Freq: Every day | RECTAL | Status: DC | PRN

## 2021-12-02 MED ORDER — KETAMINE HCL 10 MG/ML IJ SOLN
INTRAMUSCULAR | Status: DC | PRN
Start: 2021-12-02 — End: 2021-12-02
  Administered 2021-12-02: 20 mg via INTRAVENOUS

## 2021-12-02 MED ORDER — MIDAZOLAM HCL 2 MG/2ML IJ SOLN
INTRAMUSCULAR | Status: AC
  Filled 2021-12-02: qty 2

## 2021-12-02 MED ORDER — HYDROMORPHONE HCL 1 MG/ML IJ SOLN
1.0000 mg | Freq: Once | INTRAMUSCULAR | Status: AC
  Administered 2021-12-02: 1 mg via INTRAVENOUS
  Filled 2021-12-02: qty 1

## 2021-12-02 SURGICAL SUPPLY — 50 items
BENZOIN TINCTURE PRP APPL 2/3 (GAUZE/BANDAGES/DRESSINGS) ×4 IMPLANT
BLADE CLIPPER SURG (BLADE) ×4 IMPLANT
CANISTER SUCT 3000ML PPV (MISCELLANEOUS) ×4 IMPLANT
CATH THORACIC 28FR (CATHETERS) IMPLANT
CATH THORACIC 28FR RT ANG (CATHETERS) IMPLANT
CATH THORACIC 36FR (CATHETERS) IMPLANT
CNTNR URN SCR LID CUP LEK RST (MISCELLANEOUS) IMPLANT
CONT SPEC 4OZ STRL OR WHT (MISCELLANEOUS)
CONTAINER PROTECT SURGISLUSH (MISCELLANEOUS) ×4 IMPLANT
COVER SURGICAL LIGHT HANDLE (MISCELLANEOUS) ×8 IMPLANT
DERMABOND ADVANCED (GAUZE/BANDAGES/DRESSINGS)
DERMABOND ADVANCED .7 DNX12 (GAUZE/BANDAGES/DRESSINGS) IMPLANT
DRAIN CHANNEL 19F RND (DRAIN) ×2 IMPLANT
DRAIN CONNECTOR BLAKE 1:1 (MISCELLANEOUS) ×2 IMPLANT
DRAPE HALF SHEET 40X57 (DRAPES) ×4 IMPLANT
DRAPE LAPAROSCOPIC ABDOMINAL (DRAPES) ×4 IMPLANT
ELECT REM PT RETURN 9FT ADLT (ELECTROSURGICAL) ×4
ELECTRODE REM PT RTRN 9FT ADLT (ELECTROSURGICAL) ×3 IMPLANT
GAUZE 4X4 16PLY ~~LOC~~+RFID DBL (SPONGE) ×4 IMPLANT
GAUZE SPONGE 4X4 12PLY STRL (GAUZE/BANDAGES/DRESSINGS) IMPLANT
GLOVE BIO SURGEON STRL SZ7.5 (GLOVE) ×8 IMPLANT
HEMOSTAT POWDER SURGIFOAM 1G (HEMOSTASIS) IMPLANT
KIT BASIN OR (CUSTOM PROCEDURE TRAY) ×2 IMPLANT
KIT TURNOVER KIT B (KITS) ×4 IMPLANT
NS IRRIG 1000ML POUR BTL (IV SOLUTION) ×4 IMPLANT
PACK CHEST (CUSTOM PROCEDURE TRAY) ×4 IMPLANT
PAD ARMBOARD 7.5X6 YLW CONV (MISCELLANEOUS) ×8 IMPLANT
PAD ELECT DEFIB RADIOL ZOLL (MISCELLANEOUS) ×2 IMPLANT
SPONGE T-LAP 18X18 ~~LOC~~+RFID (SPONGE) ×16 IMPLANT
SPONGE T-LAP 4X18 ~~LOC~~+RFID (SPONGE) ×4 IMPLANT
STRIP CLOSURE SKIN 1/2X4 (GAUZE/BANDAGES/DRESSINGS) ×4 IMPLANT
SUT SILK  1 MH (SUTURE) ×1
SUT SILK 1 MH (SUTURE) ×1 IMPLANT
SUT SILK 2 0 SH CR/8 (SUTURE) ×2 IMPLANT
SUT VIC AB 1 CTX 18 (SUTURE) ×2 IMPLANT
SUT VIC AB 1 CTX 36 (SUTURE)
SUT VIC AB 1 CTX36XBRD ANBCTR (SUTURE) ×2 IMPLANT
SUT VIC AB 3-0 SH 27 (SUTURE) ×1
SUT VIC AB 3-0 SH 27X BRD (SUTURE) ×1 IMPLANT
SUT VIC AB 3-0 X1 27 (SUTURE) ×2 IMPLANT
SYR 10ML LL (SYRINGE) IMPLANT
SYR 50ML SLIP (SYRINGE) IMPLANT
SYSTEM SAHARA CHEST DRAIN ATS (WOUND CARE) ×2 IMPLANT
TAPE PAPER 2X10 WHT MICROPORE (GAUZE/BANDAGES/DRESSINGS) ×2 IMPLANT
TOWEL GREEN STERILE (TOWEL DISPOSABLE) ×4 IMPLANT
TOWEL GREEN STERILE FF (TOWEL DISPOSABLE) ×4 IMPLANT
TRAP SPECIMEN MUCUS 40CC (MISCELLANEOUS) IMPLANT
TRAY FOLEY MTR SLVR 14FR STAT (SET/KITS/TRAYS/PACK) ×2 IMPLANT
TRAY FOLEY SLVR 16FR TEMP STAT (SET/KITS/TRAYS/PACK) ×2 IMPLANT
WATER STERILE IRR 1000ML POUR (IV SOLUTION) ×8 IMPLANT

## 2021-12-02 NOTE — Anesthesia Procedure Notes (Signed)
Central Venous Catheter Insertion ?Performed by: Santa Lighter, MD, anesthesiologist ?Start/End05/08/2021 12:49 PM, 11/22/2021 12:59 PM ?Preanesthetic checklist: patient identified, IV checked, site marked, risks and benefits discussed, surgical consent, monitors and equipment checked, pre-op evaluation, timeout performed and anesthesia consent ?Position: Trendelenburg ?Lidocaine 1% used for infiltration and patient sedated ?Hand hygiene performed , maximum sterile barriers used  and Seldinger technique used ?Catheter size: 8 Fr ?Total catheter length 16. ?Central line was placed.Double lumen ?Procedure performed using ultrasound guided technique. ?Ultrasound Notes:anatomy identified, needle tip was noted to be adjacent to the nerve/plexus identified, no ultrasound evidence of intravascular and/or intraneural injection and image(s) printed for medical record ?Attempts: 1 ?Following insertion, line sutured, dressing applied and Biopatch. ?Post procedure assessment: blood return through all ports, free fluid flow and no air ? ?Patient tolerated the procedure well with no immediate complications. ? ? ? ? ?

## 2021-12-02 NOTE — Progress Notes (Signed)
?   ?  [address] ?      [place] [address] ?            [phone]      ? ?No events ? ?Vitals:  ? 11/18/2021 1124 12/11/2021 1229  ?BP: 127/80 110/62  ?Pulse: 95 96  ?Resp: 19 18  ?Temp: 98.2 ?F (36.8 ?C) (!) 97.4 ?F (36.3 ?C)  ?SpO2: 97% 98%  ? ?Lethargic ?Sinus ?ND ? ?OR for L VATS, pericardial window ? ?Lajuana Matte ? ?

## 2021-12-02 NOTE — Progress Notes (Signed)
? ?[facility] ?PROGRESS NOTE ? ? ?Carly Abbott BBC:[id number] DOB: July 31, 1955 DOA: 11/21/2021 ? ?PCP: Carly Body, MD ? ?Brief History/Interval Summary: 66 y.o. female with medical history significant of depression, anxiety, MS, HTN. ?  ?Pt with recent admit also at [facility] for SOB, pleural effusion on 5/4.  Effusion drained but rapidly re-occurred and pt readmitted to [facility] 5/6. ?  ?Summary of stay at [facility]: ?  ?5/7: requiring low dose morphine to control dyspnea. ?  ?5/8: Pleurx catheter placed by interventional radiology. ?  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  Dr. Tasia Abbott was reconsulted. ?  ?11/26/2021 CT scan of the chest showed no evidence of pulmonary embolism or thoracic dissection.  Showed a moderate to large pericardial effusion and small bilateral pleural effusions.  Infiltrate seen in both lungs and COPD seen.  Patient was started on Rocephin and Zithromax on 11/27/2021. ?  ?Echocardiogram resulted on 11/28/2021 showed a moderate pericardial effusion with normal ejection fraction and no signs of pericardial tamponade.   ?  ?Cardiology on the evening of 5/12 recommended cardiothoracic surgery consultation for pericardial window to reduce the amount of pericardial effusion and reduce the potential risk of pericardial tamponade.  Case discussed with covering oncology on 11/29/2021 and recommended speaking with cardiothoracic surgery.  Hospitalist at [facility] spoke with cardio thoracic surgery Dr. Prescott Gum who reviewed viewed the patient's studies and recommended transfer to [facility] for pericardial window. ?  ?Stay also complicated by constipation demonstrated on CT scan.  Some question of gastric outlet obstruction but pt without N/V.  Pt started on laxatives for constipation. ? ? ?Consultants:  ?Medical oncology at [facility]: Dr. Tasia Abbott.   ?Interventional radiology at [facility]  ?Cardiology at [facility]: Dr. Nehemiah Abbott. ?Cardiothoracic surgery here at  [facility]: Dr. Darcey Abbott ?Palliative care at [facility] ? ?Procedures: Plan is for pericardial window today ? ? ? ?Subjective/Interval History: ?Had a rough night with pain and shortness of breath.  Cough may be slightly better.  Denies any nausea vomiting.  Patient's brother is at the bedside.   ? ? ?Assessment/Plan: ? ?Pericardial effusion ?Echo shows large pericardial effusion but no tamponade physiology at this time. Pt transferred from [facility] to [facility] for pericardial window. ?Patient seen by Dr. Darcey Abbott.  Plan is for pericardial window on 5/16.  ?Effusion most likely malignant in etiology. ?  ?Acute respiratory failure with hypoxia ([place]) ?4L O2 requirement down from 6L on admission to [facility]. ?Due to PNA + cancer. ?Will likely need home oxygen at discharge. ?  ?Metastatic adenocarcinoma ([place]) ?Pt with malignant pleural effusion on cytology.  Likely lung origin.  By Dr. Tasia Abbott with medical oncology at [facility].  Will need outpatient follow-up with him after discharge. ?Per last note by Dr. Tasia Abbott, prognosis is thought to be poor.  Waiting on further studies to see if she will be a candidate for immunotherapy.  Patient told him that she was leaning towards no chemotherapy.  Could be a candidate for hospice going forward but will depend on results of further test that were ordered. ?Started on cough suppressants.  Patient's status noted to be tenuous.  Palliative care is following. ? ?Hypotension ?Pt had tachycardia and hypotension during stay at [facility], started on midodrine.  Blood pressures have improved.  Blood pressure noted to be in the hypertensive range now.  Cut back on midodrine. ?  ?Community-acquired pneumonia ?CT at [facility] showing PNA.  Was placed on ceftriaxone and azithromycin.  Completed course of  antibiotics.   ?Elevated WBC most likely due to steroids.  Recheck labs tomorrow. ?  ?Pleural effusion on right ?Patient underwent thoracentesis on 5/5 with 1.2 L fluid removed.  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  ?IR  placed a Pleurx drainage catheter on 5/8. ?Experiencing significant pain and discomfort.  Was started on OxyContin 10 mg twice a day and oxycodone as needed.  Continue cough suppressants.  May need to increase the dose of the narcotics.  Consider doing so after her procedure. ?Drainage per the Plurex order set.   ?  ?Constipation ?Acute constipation noted on CT. started on aggressive bowel regimen.  There was some question of gastric outlet obstruction but patient without any symptoms.  Continue to monitor for now.  Constipation likely due to pain medications.  ?Still has not had a BM.  Will order enema.  May need to consider Movantik if there is no relief. ? ?Subclinical hypothyroidism ?TSH noted to be elevated at 9.7.  Patient mentions that she has a longstanding history of subclinical hypothyroidism.  Free T4 noted to be 0.94.  No further work-up at this time. ? ?Abnormal LFTs ?Mildly elevated AST and ALT level noted.  Only done CT scan did not show any liver lesions.  Continue to trend for now. ? ?Protein-calorie malnutrition, severe ?In setting of metastatic CA ?  ?Anxiety and depression ?Continue zoloft and valium. ? ?History of multiple sclerosis ?Stable.  Uses Flexeril at home. ? ?Goals of care ?Palliative care continues to follow. ?  ?  ? ?DVT Prophylaxis: Lovenox ?Code Status: DNR.  DNR will be rescinded at the time of surgery as she will need to be intubated for the pericardial window. ?Family Communication: Discussed with patient ?Disposition Plan: To be determined. Was seen by PT and OT a few days ago and home health was recommended at that time. ? ?Status is: Inpatient ?Remains inpatient appropriate because: Pericardial effusion requiring pericardial window ? ? ? ? ? ?Medications: Scheduled: ? benzonatate  200 mg Oral TID  ? cyclobenzaprine  10 mg Oral BID  ? enoxaparin (LOVENOX) injection  30 mg Subcutaneous Q24H  ? feeding supplement  237 mL Oral TID BM  ? methocarbamol  500 mg Oral Q8H  ? midodrine   5 mg Oral BID WC  ? oxybutynin  5 mg Oral BID  ? oxyCODONE  10 mg Oral Q12H  ? polyethylene glycol  17 g Oral BID  ? Ensure Max Protein  11 oz Oral Daily  ? senna-docusate  2 tablet Oral BID  ? sertraline  50 mg Oral Daily  ? ?Continuous: ?  ceFAZolin (ANCEF) IV    ? ?XNA:TFTDDUKGURKYH **OR** acetaminophen, chlorpheniramine-HYDROcodone, diazepam, lactulose, morphine (PF), ondansetron **OR** ondansetron (ZOFRAN) IV, oxyCODONE ? ?Antibiotics: ?Anti-infectives (From admission, onward)  ? ? Start     Dose/Rate Route Frequency Ordered Stop  ? 12/01/21 0801  ceFAZolin (ANCEF) IVPB 2g/100 mL premix       ? 2 g ?200 mL/hr over 30 Minutes Intravenous 30 min pre-op 12/01/21 0801    ? 11/23/2021 1000  azithromycin (ZITHROMAX) tablet 250 mg       ? 250 mg Oral Daily 12/09/2021 0122 12/01/21 1049  ? 11/28/2021 1000  cefTRIAXone (ROCEPHIN) 2 g in sodium chloride 0.9 % 100 mL IVPB       ? 2 g ?200 mL/hr over 30 Minutes Intravenous Every 24 hours 12/09/2021 0122 12/01/21 1130  ? ?  ? ? ?Objective: ? ?Vital Signs ? ?Vitals:  ? 12/16/2021 0000 12/13/2021  0400 11/26/2021 0732 11/26/2021 0800  ?BP: (!) 145/91 (!) 150/108 135/81 (!) 146/75  ?Pulse:   95   ?Resp:   19   ?Temp:  97.7 ?F (36.5 ?C)    ?TempSrc:  Oral Oral   ?SpO2:   96%   ?Weight:  41.2 kg    ?Height:      ? ? ?Intake/Output Summary (Last 24 hours) at 12/14/2021 1121 ?Last data filed at 11/18/2021 0100 ?Gross per 24 hour  ?Intake 340 ml  ?Output 500 ml  ?Net -160 ml  ? ? ?Filed Weights  ? 11/27/2021 0155 12/01/21 0432 11/28/2021 0400  ?Weight: 42.9 kg 41.6 kg 41.2 kg  ? ? ?General appearance: Awake alert.  In no distress ?Resp: Tachypneic.  Diminished air entry at the bases.  Few crackles. ?Cardio: S1-S2 is normal regular.  No S3-S4.  No rubs murmurs or bruit ?GI: Abdomen is soft.  Nontender nondistended.  Bowel sounds are present normal.  No masses organomegaly ?Extremities: No edema.  Full range of motion of lower extremities. ?Neurologic: Alert and oriented x3.  No focal neurological  deficits.  ? ? ? ?Lab Results: ? ?Data Reviewed: I have personally reviewed following labs and reports of the imaging studies ? ?CBC: ?Recent Labs  ?Lab 11/26/21 ?2301 11/28/21 ?3462 12/01/21 ?0422  ?WBC 10.1 13.3*

## 2021-12-02 NOTE — Progress Notes (Signed)
Mobility Specialist Progress Note: ? ? 12/09/2021 1200  ?Mobility  ?Activity Ambulated with assistance in room  ?Level of Assistance Moderate assist, patient does 50-74%  ?Assistive Device Other (Comment) ?(HHA +2)  ?Distance Ambulated (ft) 10 ft  ?Activity Response Tolerated well  ?$Mobility charge 1 Mobility  ? ?Pt needing to transfer to stretcher for procedure. Required HHA of 2 d/t lethargy. Pt left on stretcher, transport to OR.  ?  ?Nelta Numbers ?Acute Rehab ?Secure Chat or ?Office Phone: [phone] ? ?

## 2021-12-02 NOTE — Op Note (Signed)
      [address]       [place] [address]             [phone]        11/21/2021  Patient:  Carly Abbott Pre-Op Dx: Lung cancer Pericardial effusion   Post-op Dx:  same Procedure: - left Video assisted thoracoscopy - Pericardial Window - Evacuation of pericardial effusion   Surgeon and Role:      * Karmello Abercrombie, Lucile Crater, MD - Primary  Anesthesia  general EBL:  92ml Blood Administration: none Specimen:  pericardium  Drains: 19 F blake chest tube in left chest Counts: correct   Indications: 66 yo female transferred from [place] with pericardial effusion in the setting of advanced lung cancer.  Findings: Turbid pericardial fluid  Operative Technique: After the risks, benefits and alternatives were thoroughly discussed, the patient was brought to the operative theatre.  Anesthesia was induced, and the patient was then placed in a lazy lateral decubitus position and was prepped and draped in normal sterile fashion.  An appropriate surgical pause was performed, and pre-operative antibiotics were dosed accordingly.  We began with 1cm incision in the anterior axillary line at the 4th intercostal space.  A 41mm trocar was then introduced.  The pleural cavity was then insufflated with Co2.  The 2 additional trocars were then introduced to triangulate the pericardium.  The pericardial sac was then incised, and a 3x3 cm window was created anterior to the phrenic nerve.  The pericardial fluid was turbid.  The pericardium was removed, and 95F blake drain was then passed into the pericardial sac.  We watch the remaining lobes re-expand.  The skin and soft tissue were closed with absorbable suture    The patient tolerated the procedure without any immediate complications, and was transferred to the PACU in stable condition.  Franchot Pollitt Bary Leriche

## 2021-12-02 NOTE — Anesthesia Preprocedure Evaluation (Signed)
Anesthesia Evaluation  ?Patient identified by MRN, date of birth, ID band ?Patient awake ? ? ? ?Reviewed: ?Allergy & Precautions, NPO status , Patient's Chart, lab work & pertinent test results ? ?Airway ?Mallampati: III ? ?TM Distance: >3 FB ?Neck ROM: Full ? ? ? Dental ? ?(+) Dental Advisory Given, Edentulous Upper, Edentulous Lower ?  ?Pulmonary ?pneumonia,  ?Metastatic lung ca ?  ?Pulmonary exam normal ?breath sounds clear to auscultation ? ? ? ? ? ? Cardiovascular ?Normal cardiovascular exam ?Rhythm:Regular Rate:Normal ? ?PERICARDIAL EFFUSION ?  ?Neuro/Psych ? Headaches, PSYCHIATRIC DISORDERS Anxiety Depression Multiple sclerosis ? ?  ? GI/Hepatic ?negative GI ROS, Neg liver ROS,   ?Endo/Other  ?negative endocrine ROS ? Renal/GU ?negative Renal ROS  ? ?  ?Musculoskeletal ?negative musculoskeletal ROS ?(+)  ? Abdominal ?  ?Peds ? Hematology ?negative hematology ROS ?(+)   ?Anesthesia Other Findings ?Day of surgery medications reviewed with the patient. ? Reproductive/Obstetrics ? ?  ? ? ? ? ? ? ? ? ? ? ? ? ? ?  ?  ? ? ? ? ? ? ? ? ?Anesthesia Physical ?Anesthesia Plan ? ?ASA: 4 ? ?Anesthesia Plan: General  ? ?Post-op Pain Management:   ? ?Induction: Intravenous ? ?PONV Risk Score and Plan: 3 and Dexamethasone and Ondansetron ? ?Airway Management Planned: Double Lumen EBT ? ?Additional Equipment: CVP, Arterial line and Ultrasound Guidance Line Placement ? ?Intra-op Plan:  ? ?Post-operative Plan: Possible Post-op intubation/ventilation ? ?Informed Consent: I have reviewed the patients History and Physical, chart, labs and discussed the procedure including the risks, benefits and alternatives for the proposed anesthesia with the patient or authorized representative who has indicated his/her understanding and acceptance.  ? ?Patient has DNR.  ?Discussed DNR with patient and Suspend DNR. ?  ?Dental advisory given ? ?Plan Discussed with: CRNA ? ?Anesthesia Plan Comments:    ? ? ? ? ? ? ?Anesthesia Quick Evaluation ? ?

## 2021-12-02 NOTE — Anesthesia Procedure Notes (Signed)
Procedure Name: Intubation ?Date/Time: 12/05/2021 1:22 PM ?Performed by: Santa Lighter, MD ?Pre-anesthesia Checklist: Patient identified, Emergency Drugs available, Suction available and Patient being monitored ?Patient Re-evaluated:Patient Re-evaluated prior to induction ?Oxygen Delivery Method: Circle system utilized ?Preoxygenation: Pre-oxygenation with 100% oxygen ?Induction Type: IV induction ?Ventilation: Mask ventilation without difficulty ?Laryngoscope Size: Mac and 3 ?Grade View: Grade I ?Endobronchial tube: Left and Double lumen EBT and 35 Fr ?Number of attempts: 1 ?Airway Equipment and Method: Stylet and Oral airway ?Placement Confirmation: ETT inserted through vocal cords under direct vision, positive ETCO2 and breath sounds checked- equal and bilateral ?Secured at: 26 cm ?Tube secured with: Tape ?Dental Injury: Teeth and Oropharynx as per pre-operative assessment  ?Comments: Attempt x1 by CRNA, unable to advance DLT past VC.  Attempt x1 by MDA and DLT advanced through glottis with minimal resistance.  Positioning confirmed with fiberoptic video display and auscultation. ? ?S. Gifford Shave, MD ? ? ? ? ?

## 2021-12-02 NOTE — Progress Notes (Signed)
? ?   ?  [address] ?      [place] [address] ?            [phone]   ? ?  ?S/p left VATS pericardial window ? ?BP 120/65   Pulse 99   Temp (!) 97 ?F (36.1 ?C)   Resp (!) 23   Ht 4\' 10"  (1.473 m)   Wt 41.2 kg   SpO2 95%   BMI 18.98 kg/m?  ?~150 ml out from left sided drain ?Some SQ air on left ?Was present on postop CXR ? ?Revonda Standard Roxan Hockey, MD ?[facility] ?[phone] ? ? ? ?

## 2021-12-02 NOTE — Significant Event (Signed)
Pt up in chart during report with brother at the bedside. Gave Prns for pain. Plans for OR at 2pm. ?

## 2021-12-02 NOTE — Progress Notes (Signed)
Request for [facility] [place] [facility] faxed to pathology for send out.  ?

## 2021-12-02 NOTE — Progress Notes (Signed)
Patient ID: Carly Abbott, female   DOB: Mar 15, 1956, 66 y.o.   MRN: [id number] ? ? ? ?Progress Note from the Palliative Medicine Team at [facility] ? ? ?Patient Name: Carly Abbott        ?Date: 12/10/2021 ?DOB: 24-Dec-1955  Age: 66 y.o. MRN#: [id number] ?Attending Physician: Bonnielee Haff, MD ?Primary Care Physician: Dion Body, MD ?Admit Date: 12/01/2021 ? ? ?Medical records reviewed  ? ?66 y.o. female   admitted on 12/14/2021 with past medical history significant for depression with anxiety,[place] multiple sclerosis, vestibular schwannoma, hypertension, and hyperlipidemia and recent admission for shortness of breath and found to have a right pleural effusion, significant for positive pathology for adenocarcinoma with suspected lung origin ?  ?Pt with recent admit also at [facility] for SOB, pleural effusion on 5/4.  Effusion drained but rapidly re-occurred and pt readmitted to [facility] 5/6. ? ?Summary of stay at [facility]: ?  ?5/7: requiring low dose morphine to control dyspnea. ?  ?5/8: Pleurx catheter placed by interventional radiology. ?  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  Dr. Tasia Catchings was reconsulted. ?  ?11/26/2021 CT scan of the chest showed no evidence of pulmonary embolism or thoracic dissection.  Showed a moderate to large pericardial effusion and small bilateral pleural effusions.  Infiltrate seen in both lungs and COPD seen.  Patient was started on Rocephin and Zithromax on 11/27/2021. ?Oncology consult note/Dr.Yu reflects likely overall poor prognosis, ongoing conversation regarding treatment options. ?  ?Echocardiogram resulted on 11/28/2021 showed a moderate pericardial effusion with normal ejection fraction and no signs of pericardial tamponade.   ?  ?Cardiology on the evening of 5/12 recommended cardiothoracic surgery consultation for pericardial window to reduce the amount of pericardial effusion and reduce the potential risk of  pericardial tamponade.   ?  ?Case discussed with covering oncology on 11/29/2021 and recommended speaking with cardiothoracic surgery.   ?  ?Hospital ist at [facility] spoke with cardio thoracic surgery Dr. Prescott Gum who reviewed viewed the patient's studies and recommended transfer to [facility] for pericardial window. ?  ?Currently at [place].  TEE scheduled for today with pericardial window and thoracotomy  ?  ?Patient and her family face ongoing treatment option decisions, advanced directives decisions and anticipatory care needs. ?  ? ? ?This NP visited patient at the bedside as a follow up to  yesterday's [place], pateitn's brother/HPOA/ Sylvie Farrier at the bedside  ? ?Created space and opportunity for patient and her brother to explore thoughts and feelings regarding current medical situation.  Both verbalized a clear understanding of the seriousness of the situation and the associated poor prognosis. ? ?Patient and her family are open  to treating  the treatable, hoping for continued quality of life.  At this point she does not have a clear understanding of her oncology options.  Plan is to take this hospital stay 1 step at a time and make decisions dependent on outcomes. ? ?Patient is questioning whether she would want to move forward with cancer treatment at this point in her life. ? ?Patient is able to verbalize clearly and her brother validates back his understanding that comfort and dignity are priority to [facility].  He assures her he will do everything possible to honor her wishes. ? ?Education offered today regarding  the importance of continued conversation with family and their  medical providers regarding overall plan of care and treatment options,  ensuring decisions are within the  context of the patients values and GOCs. ? ?Education offered on hospice benefit; but philosophy and eligibility both in the home and residential hospice ? ?Questions and concerns addressed     ? ?PMT will continue to  support holistically. ? ?Wadie Lessen NP  ?Palliative Medicine Team Team Phone # [phone] ?Pager [phone] ?  ?

## 2021-12-02 NOTE — Anesthesia Postprocedure Evaluation (Signed)
Anesthesia Post Note ? ?Patient: Carly Abbott ? ?Procedure(s) Performed: SUBXYPHOID PERICARDIAL WINDOW (THORACOTOMY APPROACH) ?TRANSESOPHAGEAL ECHOCARDIOGRAM (TEE) ? ?  ? ?Patient location during evaluation: PACU ?Anesthesia Type: General ?Level of consciousness: awake and alert ?Pain management: pain level controlled ?Vital Signs Assessment: post-procedure vital signs reviewed and stable ?Respiratory status: spontaneous breathing and patient connected to face mask oxygen ?Cardiovascular status: blood pressure returned to baseline and stable ?Postop Assessment: no apparent nausea or vomiting ?Anesthetic complications: no ?Comments: ABG showing poor oxygenation.  Surgeon alerted and will transfer to ICU for further monitoring. ? ? ?No notable events documented. ? ?Last Vitals:  ?Vitals:  ? 11/17/2021 1605 11/24/2021 1610  ?BP:  103/67  ?Pulse:  93  ?Resp: 16 16  ?Temp:  (!) 36.1 ?C  ?SpO2:  (!) 87%  ?  ?Last Pain:  ?Vitals:  ? 12/01/2021 1610  ?TempSrc:   ?PainSc: 0-No pain  ? ? ?  ?  ?  ?  ?  ?  ? ?Santa Lighter ? ? ? ? ?

## 2021-12-02 NOTE — Transfer of Care (Signed)
Immediate Anesthesia Transfer of Care Note ? ?Patient: Carly Abbott ? ?Procedure(s) Performed: SUBXYPHOID PERICARDIAL WINDOW (THORACOTOMY APPROACH) ?TRANSESOPHAGEAL ECHOCARDIOGRAM (TEE) ? ?Patient Location: PACU ? ?Anesthesia Type:General ? ?Level of Consciousness: drowsy ? ?Airway & Oxygen Therapy: Patient Spontanous Breathing and Patient connected to face mask oxygen ? ?Post-op Assessment: Report given to RN and Post -op Vital signs reviewed and stable ? ?Post vital signs: Reviewed and stable ? ?Last Vitals:  ?Vitals Value Taken Time  ?BP 93/58 11/18/2021 1438  ?Temp    ?Pulse 79 12/10/2021 1445  ?Resp 13 12/12/2021 1445  ?SpO2 91 % 12/11/2021 1445  ?Vitals shown include unvalidated device data. ? ?Last Pain:  ?Vitals:  ? 12/14/2021 1229  ?TempSrc: Oral  ?PainSc: 7   ?   ? ?Patients Stated Pain Goal: 3 (12/11/2021 1229) ? ?Complications: No notable events documented. ?

## 2021-12-02 NOTE — Progress Notes (Signed)
Patient currently on 6L [place]. No distress noted. BIPAP not needed at this time. RT will monitor as needed. ?

## 2021-12-02 NOTE — Progress Notes (Signed)
Mobility Specialist Progress Note: ? ? 12/09/2021 1140  ?Mobility  ?Activity  ?(chair level exercises)  ?Range of Motion/Exercises Active;Passive;All extremities  ?Assistive Device None  ?Activity Response Tolerated fair  ?$Mobility charge 1 Mobility  ? ?Pt limited by lethargy this am. Received in chair, agreeable to exercises. Pt left with all needs met, family members present.  ? ?Nelta Numbers ?Acute Rehab ?Secure Chat or ?Office Phone: [phone] ? ?

## 2021-12-02 NOTE — Anesthesia Procedure Notes (Signed)
Arterial Line Insertion ?Start/End05/29/2023 12:50 PM, 12/07/2021 12:54 PM ?Performed by: Wilburn Cornelia, CRNA, CRNA ? Preanesthetic checklist: patient identified, IV checked, site marked, risks and benefits discussed, surgical consent, monitors and equipment checked, pre-op evaluation, timeout performed and anesthesia consent ?Lidocaine 1% used for infiltration ?Left, radial was placed ?Catheter size: 20 G ?Hand hygiene performed  and maximum sterile barriers used  ?Allen's test indicative of satisfactory collateral circulation ?Attempts: 1 ?Procedure performed without using ultrasound guided technique. ?Following insertion, Biopatch and dressing applied. ?Post procedure assessment: normal ? ? ? ?

## 2021-12-02 NOTE — Brief Op Note (Signed)
12/05/2021 - 11/17/2021 ? ?1:32 PM ? ?PATIENT:  Carly Abbott  66 y.o. female ? ?PRE-OPERATIVE DIAGNOSIS:  PERICARDIAL EFFUSION ? ?POST-OPERATIVE DIAGNOSIS:  * No post-op diagnosis entered * ? ?PROCEDURE:  Procedure(s): ?SUBXYPHOID PERICARDIAL WINDOW (THORACOTOMY APPROACH) (N/A) ?TRANSESOPHAGEAL ECHOCARDIOGRAM (TEE) (N/A) ? ?SURGEON:  Surgeon(s) and Role: ?   * Lajuana Matte, MD - Primary ? ?PHYSICIAN ASSISTANT: none ? ?ASSISTANTS: Ara Kussmaul RNFA  ? ?ANESTHESIA:   general ? ?EBL:  minimal ? ? BLOOD ADMINISTERED:none ? ?DRAINS:  one mediastinal chest tube   ? ?LOCAL MEDICATIONS USED:  NONE ? ?SPECIMEN:  Source of Specimen:  pericardium/pericardial fluid ? ?DISPOSITION OF SPECIMEN:  PATHOLOGY ? ?COUNTS:  YES ? ?TOURNIQUET:  * No tourniquets in log * ? ?DICTATION: .Dragon Dictation ? ?PLAN OF CARE: Admit to inpatient  ? ?PATIENT DISPOSITION:  PACU - hemodynamically stable. ?  ?Delay start of Pharmacological VTE agent (>24hrs) due to surgical blood loss or risk of bleeding: no ? ?

## 2021-12-03 ENCOUNTER — Other Ambulatory Visit: Payer: Self-pay

## 2021-12-03 ENCOUNTER — Inpatient Hospital Stay (HOSPITAL_COMMUNITY): Payer: Medicare Other

## 2021-12-03 ENCOUNTER — Encounter (HOSPITAL_COMMUNITY): Payer: Self-pay | Admitting: Thoracic Surgery (Cardiothoracic Vascular Surgery)

## 2021-12-03 DIAGNOSIS — J90 Pleural effusion, not elsewhere classified: Secondary | ICD-10-CM | POA: Diagnosis not present

## 2021-12-03 DIAGNOSIS — C799 Secondary malignant neoplasm of unspecified site: Secondary | ICD-10-CM | POA: Diagnosis not present

## 2021-12-03 DIAGNOSIS — J9601 Acute respiratory failure with hypoxia: Secondary | ICD-10-CM | POA: Diagnosis not present

## 2021-12-03 DIAGNOSIS — I3139 Other pericardial effusion (noninflammatory): Secondary | ICD-10-CM | POA: Diagnosis not present

## 2021-12-03 LAB — CBC
HCT: 42.9 % (ref 36.0–46.0)
Hemoglobin: 13.5 g/dL (ref 12.0–15.0)
MCH: 29.9 pg (ref 26.0–34.0)
MCHC: 31.5 g/dL (ref 30.0–36.0)
MCV: 94.9 fL (ref 80.0–100.0)
Platelets: 559 10*3/uL — ABNORMAL HIGH (ref 150–400)
RBC: 4.52 MIL/uL (ref 3.87–5.11)
RDW: 13.6 % (ref 11.5–15.5)
WBC: 20.2 10*3/uL — ABNORMAL HIGH (ref 4.0–10.5)
nRBC: 0 % (ref 0.0–0.2)

## 2021-12-03 LAB — COMPREHENSIVE METABOLIC PANEL
ALT: 60 U/L — ABNORMAL HIGH (ref 0–44)
AST: 47 U/L — ABNORMAL HIGH (ref 15–41)
Albumin: 2.5 g/dL — ABNORMAL LOW (ref 3.5–5.0)
Alkaline Phosphatase: 141 U/L — ABNORMAL HIGH (ref 38–126)
Anion gap: 9 (ref 5–15)
BUN: 24 mg/dL — ABNORMAL HIGH (ref 8–23)
CO2: 26 mmol/L (ref 22–32)
Calcium: 8.6 mg/dL — ABNORMAL LOW (ref 8.9–10.3)
Chloride: 99 mmol/L (ref 98–111)
Creatinine, Ser: 0.59 mg/dL (ref 0.44–1.00)
GFR, Estimated: >60 mL/min (ref >60)
Glucose, Bld: 125 mg/dL — ABNORMAL HIGH (ref 70–99)
Potassium: 5.1 mmol/L (ref 3.5–5.1)
Sodium: 134 mmol/L — ABNORMAL LOW (ref 135–145)
Total Bilirubin: 0.5 mg/dL (ref 0.3–1.2)
Total Protein: 5.6 g/dL — ABNORMAL LOW (ref 6.5–8.1)

## 2021-12-03 LAB — PROCALCITONIN: Procalcitonin: 0.11 ng/mL

## 2021-12-03 MED ORDER — BISACODYL 10 MG RE SUPP
10.0000 mg | Freq: Every day | RECTAL | Status: DC
  Administered 2021-12-03: 10 mg via RECTAL
  Filled 2021-12-03 (×2): qty 1

## 2021-12-03 NOTE — Progress Notes (Signed)
? ?[facility] ?PROGRESS NOTE ? ? ?Carly Abbott UMP:[id number] DOB: Nov 29, 1955 DOA: 12/06/2021 ? ?PCP: Dion Body, MD ? ?Brief History/Interval Summary: 66 y.o. female with medical history significant of depression, anxiety, MS, HTN. ?  ?Pt with recent admit also at [facility] for SOB, pleural effusion on 5/4.  Effusion drained but rapidly re-occurred and pt readmitted to [facility] 5/6. ?  ?Summary of stay at [facility]: ?  ?5/7: requiring low dose morphine to control dyspnea. ?  ?5/8: Pleurx catheter placed by interventional radiology. ?  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  Dr. Tasia Catchings was reconsulted. ?  ?11/26/2021 CT scan of the chest showed no evidence of pulmonary embolism or thoracic dissection.  Showed a moderate to large pericardial effusion and small bilateral pleural effusions.  Infiltrate seen in both lungs and COPD seen.  Patient was started on Rocephin and Zithromax on 11/27/2021. ?  ?Echocardiogram resulted on 11/28/2021 showed a moderate pericardial effusion with normal ejection fraction and no signs of pericardial tamponade.   ?  ?Cardiology on the evening of 5/12 recommended cardiothoracic surgery consultation for pericardial window to reduce the amount of pericardial effusion and reduce the potential risk of pericardial tamponade.  Case discussed with covering oncology on 11/29/2021 and recommended speaking with cardiothoracic surgery.  Hospitalist at [facility] spoke with cardio thoracic surgery Dr. Prescott Gum who reviewed viewed the patient's studies and recommended transfer to [facility] for pericardial window. ?  ?Stay also complicated by constipation demonstrated on CT scan.  Some question of gastric outlet obstruction but pt without N/V.  Pt started on laxatives for constipation. ? ?5/16 status post TEE with pericardial window with thoracotomy approach ? ? ?Consultants:  ?Medical oncology at [facility]: Dr. Tasia Catchings.   ?Interventional radiology at  [facility]  ?Cardiology at [facility]: Dr. Nehemiah Massed. ?Cardiothoracic surgery here at [facility]: Dr. Darcey Nora ?Palliative care at [facility] ? ?Procedures:  ? ?Dr. Kipp Brood 12/16/2021 ?TRANSESOPHAGEAL ECHOCARDIOGRAM (TEE) (N/A) ?PERICARDIAL WINDOW WITH THORACOTOMY APPROACH (Right) ? ?Subjective/Interval History: ? ?With significant pain postoperatively required multiple IV pain meds overnight, complains of discomfort at chest tube insertion site, as well as abdominal discomfort. ? ?Assessment/Plan: ? ?Pericardial effusion ?Echo shows large pericardial effusion but no tamponade physiology at this time. Pt transferred from [facility] to [facility] for pericardial window.  ?Effusion most likely malignant in etiology. ?-CT surgery input greatly appreciated, status post TEE and pericardial window with thoracotomy approach by Dr. Kipp Brood 5/16, postoperative management per CT surgery, continue continue with as needed pain regimen, continue with chest tube, remains on CT ICU.   ? ?Acute respiratory failure with hypoxia ([place]) ?4L O2 requirement down from 6L on admission to [facility]. ?Due to PNA + cancer. ?Remains on 6 L nasal cannula today, likely will need home oxygen on discharge. ?  ?Metastatic adenocarcinoma ([place]) ?Pt with malignant pleural effusion on cytology.  Likely lung origin.  By Dr. Tasia Catchings with medical oncology at [facility].  Will need outpatient follow-up with him after discharge. ?Per last note by Dr. Tasia Catchings, prognosis is thought to be poor.  Waiting on further studies to see if she will be a candidate for immunotherapy.  Patient told him that she was leaning towards no chemotherapy.  Could be a candidate for hospice going forward but will depend on results of further test that were ordered. ?Started on cough suppressants.  Patient's status noted to be tenuous.  Palliative care is following. ? ?Constipation ?Acute constipation noted on CT. started on aggressive bowel  regimen.  There was some question of gastric outlet obstruction but patient without any  symptoms.  Continue to monitor for now.  Constipation likely due to pain medications.  ?-will start on suppository today. ?-CT abdomen and pelvis with large stool burden in the cecum, with decompression of transverse and more distal colon, concerning for constipating lesions within the transverse colon near the hepatic flexure, she will need colonoscopy when more stable(most recent in 2017 with no acute findings.) ? ?Hypotension ?Pt had tachycardia and hypotension during stay at [facility], started on midodrine.  Blood pressures have improved.  Blood pressure noted to be in the hypertensive range now.  Cut back on midodrine. ?  ?Community-acquired pneumonia ?CT at [facility] showing PNA.  Was placed on ceftriaxone and azithromycin.  Completed course of antibiotics.   ?-Leukocytosis most likely related to steroids, and stress from recent surgery, but I will check procalcitonin ? ? ?Pleural effusion on right ?Patient underwent thoracentesis on 5/5 with 1.2 L fluid removed.  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  ?IR placed a Pleurx drainage catheter on 5/8. ?Experiencing significant pain and discomfort.  Was started on OxyContin 10 mg twice a day and oxycodone as needed.  Continue cough suppressants.  May need to increase the dose of the narcotics.  Consider doing so after her procedure. ?Drainage per the Plurex order set.   ?  ? ? ?Subclinical hypothyroidism ?TSH noted to be elevated at 9.7.  Patient mentions that she has a longstanding history of subclinical hypothyroidism.  Free T4 noted to be 0.94.  No further work-up at this time. ? ?Abnormal LFTs ?Mildly elevated AST and ALT level noted.  Only done CT scan did not show any liver lesions.   ?-Trending down ? ?Protein-calorie malnutrition, severe ?In setting of metastatic CA ?  ?Anxiety and depression ?Continue zoloft and valium. ? ?History of multiple sclerosis ?Stable.  Uses Flexeril at home. ? ?Goals of care ?Palliative care continues to follow. ?  ?  ? ?DVT  Prophylaxis: Lovenox ?Code Status: DNR.  DNR  ?Family Communication: None at bedside ?Disposition Plan: To be determined. Was seen by PT and OT a few days ago and home health was recommended at that time. ? ?Status is: Inpatient ?Remains inpatient appropriate because: Pericardial effusion requiring pericardial window ? ? ? ? ? ?Medications: Scheduled: ? benzonatate  200 mg Oral TID  ? bisacodyl  10 mg Rectal Daily  ? Chlorhexidine Gluconate Cloth  6 each Topical Daily  ? cyclobenzaprine  10 mg Oral BID  ? enoxaparin (LOVENOX) injection  30 mg Subcutaneous Q24H  ? feeding supplement  237 mL Oral TID BM  ? lactulose  30 g Oral BID  ? methocarbamol  500 mg Oral Q8H  ? midodrine  2.5 mg Oral BID WC  ? oxybutynin  5 mg Oral BID  ? oxyCODONE  10 mg Oral Q12H  ? polyethylene glycol  17 g Oral BID  ? Ensure Max Protein  11 oz Oral Daily  ? senna-docusate  2 tablet Oral BID  ? sertraline  50 mg Oral Daily  ? ?Continuous: ? ? ?OIZ:[id number] **OR** acetaminophen, chlorpheniramine-HYDROcodone, diazepam, lip balm, morphine (PF), ondansetron **OR** ondansetron (ZOFRAN) IV, oxyCODONE, sodium phosphate ? ?Antibiotics: ?Anti-infectives (From admission, onward)  ? ? Start     Dose/Rate Route Frequency Ordered Stop  ? 12/01/21 0801  ceFAZolin (ANCEF) IVPB 2g/100 mL premix       ? 2 g ?200 mL/hr over 30 Minutes Intravenous 30 min pre-op 12/01/21 0801 12/01/2021  1337  ? 12/05/2021 1000  azithromycin (ZITHROMAX) tablet 250 mg       ? 250 mg Oral Daily 12/07/2021 0122 12/01/21 1049  ? 11/17/2021 1000  cefTRIAXone (ROCEPHIN) 2 g in sodium chloride 0.9 % 100 mL IVPB       ? 2 g ?200 mL/hr over 30 Minutes Intravenous Every 24 hours 11/28/2021 0122 12/01/21 1130  ? ?  ? ? ?Objective: ? ?Vital Signs ? ?Vitals:  ? 12/03/21 0800 12/03/21 0830 12/03/21 0845 12/03/21 0900  ?BP: 95/77     ?Pulse: 94 95 92 91  ?Resp: (!) 22 (!) 22 18 (!) 24  ?Temp: 97.7 ?F (36.5 ?C)     ?TempSrc: Oral     ?SpO2: 98% 97% 91% (!) 79%  ?Weight:      ?Height:       ? ? ?Intake/Output Summary (Last 24 hours) at 12/03/2021 0934 ?Last data filed at 12/03/2021 0859 ?Gross per 24 hour  ?Intake 1200 ml  ?Output 500 ml  ?Net 700 ml  ? ?Filed Weights  ? 12/01/21 0432 12/17/2021 0400 12/03/21 0359

## 2021-12-03 NOTE — Progress Notes (Signed)
RT NOTE: ? ?Pt currently tolerating 6L [place] well. No distress. BIPAP not needed at this time.  ?

## 2021-12-03 NOTE — Progress Notes (Addendum)
? ?   ?[address] ?      [place] [address] ?            [phone]   ? ?  1 Day Post-Op Procedure(s) (LRB): ?TRANSESOPHAGEAL ECHOCARDIOGRAM (TEE) (N/A) ?PERICARDIAL WINDOW WITH THORACOTOMY APPROACH (Right) ?Subjective: ?Some discomfort from the tube, some abdominal discomfort ? ?Objective: ?Vital signs in last 24 hours: ?Temp:  [97 ?F (36.1 ?C)-99.9 ?F (37.7 ?C)] 97.7 ?F (36.5 ?C) (05/17 9371) ?Pulse Rate:  [78-102] 90 (05/17 0700) ?Cardiac Rhythm: Normal sinus rhythm;Sinus tachycardia (05/17 0740) ?Resp:  [10-34] 17 (05/17 0700) ?BP: (89-146)/(53-80) 95/54 (05/17 0600) ?SpO2:  [87 %-99 %] 99 % (05/17 0700) ?Arterial Line BP: (115-153)/(59-79) 115/67 (05/17 0700) ?Weight:  [42.8 kg] 42.8 kg (05/17 0359) ? ?Hemodynamic parameters for last 24 hours: ?  ? ?Intake/Output from previous day: ?05/16 0701 - 05/17 0700 ?In: 1080 [P.O.:480; I.V.:600] ?Out: 350 [Urine:100; Chest Tube:250] ?Intake/Output this shift: ?Total I/O ?In: 120 [P.O.:120] ?Out: 100 [Urine:50; Chest Tube:50] ? ?General appearance: alert, cooperative, distracted, fatigued, and no distress ?Heart: regular rate and rhythm ?Lungs: somewhat coarse in the upper fields ?Abdomen: some distension, some mild tenderness to palpation, + BS ?Extremities: no calf tenderness ?Wound: dressings intact ? ?Lab Results: ?Recent Labs  ?  12/01/21 ?0422 12/06/2021 ?1545 12/03/21 ?0435  ?WBC 17.2*  --  20.2*  ?HGB 14.6 14.3 13.5  ?HCT 46.0 42.0 42.9  ?PLT 625*  --  559*  ? ?BMET:  ?Recent Labs  ?  12/01/21 ?0422 12/11/2021 ?1545 12/03/21 ?0435  ?NA 137 135 134*  ?K 5.1 4.4 5.1  ?CL 94*  --  99  ?CO2 33*  --  26  ?GLUCOSE 95  --  125*  ?BUN 28*  --  24*  ?CREATININE 0.73  --  0.59  ?CALCIUM 9.4  --  8.6*  ?  ?PT/INR:  ?Recent Labs  ?  12/01/21 ?0422  ?LABPROT 11.3*  ?INR 0.8  ? ?ABG ?   ?Component Value Date/Time  ? PHART 7.312 (L) 11/26/2021 1545  ? HCO3 30.3 (H) 12/15/2021 1545  ? TCO2 32 11/25/2021 1545  ? O2SAT 90 11/19/2021 1545  ? ?CBG (last 3)  ?No  results for input(s): GLUCAP in the last 72 hours. ? ?Meds ?Scheduled Meds: ? benzonatate  200 mg Oral TID  ? Chlorhexidine Gluconate Cloth  6 each Topical Daily  ? cyclobenzaprine  10 mg Oral BID  ? enoxaparin (LOVENOX) injection  30 mg Subcutaneous Q24H  ? feeding supplement  237 mL Oral TID BM  ? lactulose  30 g Oral BID  ? methocarbamol  500 mg Oral Q8H  ? midodrine  2.5 mg Oral BID WC  ? oxybutynin  5 mg Oral BID  ? oxyCODONE  10 mg Oral Q12H  ? polyethylene glycol  17 g Oral BID  ? Ensure Max Protein  11 oz Oral Daily  ? senna-docusate  2 tablet Oral BID  ? sertraline  50 mg Oral Daily  ? ?Continuous Infusions: ?PRN Meds:.acetaminophen **OR** acetaminophen, chlorpheniramine-HYDROcodone, diazepam, lip balm, morphine (PF), ondansetron **OR** ondansetron (ZOFRAN) IV, oxyCODONE, sodium phosphate ? ?Xrays ?DG Chest 1 View ? ?Result Date: 12/13/2021 ?CLINICAL DATA:  Follow-up right pneumothorax and atelectasis. EXAM: CHEST  1 VIEW COMPARISON:  Prior today FINDINGS: A new right jugular central venous catheter seen with tip overlying the superior cavoatrial junction. Right-sided chest tube remains in place. No pneumothorax visualized on the current exam. Subcutaneous emphysema is noted in the left chest wall. Bibasilar  infiltrates or atelectasis, right side greater than left, show no significant change. Heart size remains stable. IMPRESSION: New right jugular central venous catheter in appropriate position. No pneumothorax visualized. Stable bibasilar infiltrates or atelectasis, right side greater than left. Electronically Signed   By: Marlaine Hind M.D.   On: 12/16/2021 14:53  ? ?DG CHEST PORT 1 VIEW ? ?Result Date: 12/03/2021 ?CLINICAL DATA:  Reason for exam: pericardial effusion, surgery follow up Hx of pericardial effusion. Hx of PERICARDIAL WINDOW WITH THORACOTOMY right - yesterday. EXAM: PORTABLE CHEST - 1 VIEW COMPARISON:  the previous day's study FINDINGS: Stable right PleurX without significant effusion or  pneumothorax. Stable right IJ catheter to the mid right atrium. Pericardial drain remains in place. Coarse bibasilar airspace opacities right greater than left, show some improvement since prior study. Heart size and mediastinal contours are within normal limits. Blunting of the lateral costophrenic angles as before. Cholecystectomy clips. IMPRESSION: 1. Stable support hardware 2. Improving bibasilar coarse airspace opacities. Electronically Signed   By: Lucrezia Europe M.D.   On: 12/03/2021 06:05  ? ?DG Chest Port 1 View ? ?Result Date: 12/12/2021 ?CLINICAL DATA:  Follow-up right chest tube in place. EXAM: PORTABLE CHEST 1 VIEW COMPARISON:  11/24/2021. FINDINGS: 5:18 a.m., 11/21/2021. The cardiomediastinal silhouette is stable and the central vessels are normal caliber. Mild aortic atherosclerosis. Right chest tube with tip in the medial upper chest imposing at the level of the distal trachea, is unchanged in positioning. There is a trace right apical pneumothorax. Consolidation or atelectasis in the medial right lower lung field is slightly improved today. The remaining lungs clear with COPD. The sulci are sharp. No acute osseous abnormality. IMPRESSION: Slightly improved aeration in the medial right lower lung field and trace right apical pneumothorax. No other evidence of acute chest process. Right chest tube positioning is unaltered. Electronically Signed   By: Telford Nab M.D.   On: 11/29/2021 07:17   ? ?Assessment/Plan: ?S/P Procedure(s) (LRB): ?TRANSESOPHAGEAL ECHOCARDIOGRAM (TEE) (N/A) ?PERICARDIAL WINDOW WITH THORACOTOMY APPROACH (Right) ?POD#1 ? ?1 afeb, SBP 80's-140's, SR/ST ?2 sats ok on 6 liters ?3 CT 300 cc, keep pericardial tube in place for now ?4 poor UOP, BUN/Creat 24/.59 ?5 CXR stable appearance, no pntx\ ?6 LFT's fairly stable ? Latest Reference Range & Units 12/03/21 04:35  ?Alkaline Phosphatase 38 - 126 U/L 141 (H)  ?Albumin 3.5 - 5.0 g/dL 2.5 (L)  ?AST 15 - 41 U/L 47 (H)  ?ALT 0 - 44 U/L 60 (H)   ?Total Protein 6.5 - 8.1 g/dL 5.6 (L)  ?Total Bilirubin 0.3 - 1.2 mg/dL 0.5  ?GFR, Estimated >60 mL/min >60  ?7 BS adeq control ? ? ? ? ? LOS: 3 days  ? ? ?John Giovanni PA-C ?Pager [phone] ?12/03/2021 ?  ? ?No events ?Will keep CT for now ?Rest per PCCM ? ?Lajuana Matte ? ?

## 2021-12-03 NOTE — TOC Initial Note (Signed)
Transition of Care (TOC) - Initial/Assessment Note  ? ? ?Patient Details  ?Name: Carly Abbott ?MRN: [id number] ?Date of Birth: December 20, 1955 ? ?Transition of Care (TOC) CM/SW Contact:    ?Graves-Bigelow, Ocie Cornfield, RN ?Phone Number: ?12/03/2021, 12:44 PM ? ?Clinical Narrative:   Patient is currently on unit 2 H- POD-1 pericardia window with thoracotomy. PTA patient was from home alone. Per patients cousin, the patients brother Wynonia Lawman will be staying with the patient post hospitalization. Case Manager called [facility] and she will follow the patient for home services RN, PT,OT, Aide. The office will need orders and F2F once the patient is stable. Previous Case Manager has ordered the PleurX drains. Case Manager is checking with the RN that the box is in the room. Case Manager will continue to follow for additional transition of care needs.        ? ? ?Expected Discharge Plan: [place] ?Barriers to Discharge: Continued Medical Work up ? ?Expected Discharge Plan and Services ?Expected Discharge Plan: [place] ?  ?Discharge Planning Services: CM Consult ?Post Acute Care Choice: Home Health, Resumption of Svcs/PTA Provider ?Living arrangements for the past 2 months: [place] ?                ?DME Arranged: N/A ?  ?  ?  ?  ?HH Arranged: PT, OT ?[place] Agency: [place] ?Date HH Agency Contacted: 12/03/21 ?Time [place]: 5188 ?Representative spoke with at [place]: Malachy Mood ? ?Prior Living Arrangements/Services ?Living arrangements for the past 2 months: [place] ?Lives with:: Self ?  ?Do you feel safe going back to the place where you live?: Yes      ?Need for Family Participation in Patient Care: Yes (Comment) ?Care giver support system in place?: Yes (comment) ?Current home services: DME (roling walker) ?Criminal Activity/Legal Involvement Pertinent to Current Situation/Hospitalization: No - Comment as  needed ? ?Activities of Daily Living ?Home Assistive Devices/Equipment: Eyeglasses ?ADL Screening (condition at time of admission) ?Patient's cognitive ability adequate to safely complete daily activities?: Yes ?Is the patient deaf or have difficulty hearing?: No ?Does the patient have difficulty seeing, even when wearing glasses/contacts?: No ?Does the patient have difficulty concentrating, remembering, or making decisions?: No ?Patient able to express need for assistance with ADLs?: Yes ?Does the patient have difficulty dressing or bathing?: No ?Independently performs ADLs?: Yes (appropriate for developmental age) ?Does the patient have difficulty walking or climbing stairs?: Yes ?Weakness of Legs: Both ?Weakness of Arms/Hands: None ? ?Permission Sought/Granted ?Permission sought to share information with : Family Supports, Customer service manager, Case Manager ?Permission granted to share information with : Yes, Verbal Permission Granted ?   ? Permission granted to share info w AGENCY: [facility] ?   ?   ? ?Emotional Assessment ?Appearance:: Appears stated age ?Attitude/Demeanor/Rapport: Engaged ?Affect (typically observed): Appropriate ?  ?Alcohol / Substance Use: Not Applicable ?Psych Involvement: No (comment) ? ?Admission diagnosis:  Pericardial effusion [I31.39] ?Patient Active Problem List  ? Diagnosis Date Noted  ? Constipation 12/14/2021  ? Pericardial effusion 11/27/2021  ? Metastatic adenocarcinoma ([place]) 11/26/2021  ? Acute respiratory failure with hypoxia ([place]) 11/26/2021  ? Weight loss, unintentional 11/24/2021  ? Protein-calorie malnutrition, severe 11/24/2021  ? Hypotension 11/23/2021  ? Hypokalemia 11/23/2021  ? Recurrent right pleural effusion 11/23/2021  ? SIRS (systemic inflammatory response syndrome) ([place]) 11/22/2021  ? Mid back pain 11/22/2021  ? Hypoalbuminemia due to protein-calorie malnutrition (  [place])   ? Multifocal pneumonia 11/20/2021  ? Anxiety and depression 11/20/2021  ? Pleural  effusion on right 11/20/2021  ? MS (multiple sclerosis) ([place]) 09/30/2021  ? ?PCP:  Dion Body, MD ?Pharmacy:   ?[facility] [place] [facility] [address] ?[place] [address] ?Phone: [phone] Fax: [phone] ? ?  ? ?Readmission Risk Interventions ?   ? View : No data to display.  ?  ?  ?  ? ? ? ?

## 2021-12-03 NOTE — Progress Notes (Signed)
Patient ID: Carly Abbott, female   DOB: [birth year], 66 y.o.   MRN: [id number] ?TCTS Evening Rounds: ? ?Hemodynamically stable ?Sats 95% ? ?Urine output ok ?CT output 150 cc today. ?

## 2021-12-03 NOTE — Plan of Care (Signed)
?  Problem: Clinical Measurements: ?Goal: Will remain free from infection ?Outcome: Progressing ?Goal: Diagnostic test results will improve ?Outcome: Progressing ?Goal: Respiratory complications will improve ?Outcome: Progressing ?  ?Problem: Nutrition: ?Goal: Adequate nutrition will be maintained ?Outcome: Progressing ?  ?Problem: Coping: ?Goal: Level of anxiety will decrease ?Outcome: Progressing ?  ?Problem: Elimination: ?Goal: Will not experience complications related to urinary retention ?Outcome: Progressing ?  ?Problem: Health Behavior/Discharge Planning: ?Goal: Ability to manage health-related needs will improve ?Outcome: Not Progressing ?  ?Problem: Clinical Measurements: ?Goal: Ability to maintain clinical measurements within normal limits will improve ?Outcome: Not Progressing ?  ?Problem: Activity: ?Goal: Risk for activity intolerance will decrease ?Outcome: Not Progressing ?  ?Problem: Elimination: ?Goal: Will not experience complications related to bowel motility ?Outcome: Not Progressing ?  ?Problem: Pain Managment: ?Goal: General experience of [place] will improve ?Outcome: Not Progressing ?  ?

## 2021-12-04 DIAGNOSIS — I3139 Other pericardial effusion (noninflammatory): Secondary | ICD-10-CM | POA: Diagnosis not present

## 2021-12-04 DIAGNOSIS — J90 Pleural effusion, not elsewhere classified: Secondary | ICD-10-CM | POA: Diagnosis not present

## 2021-12-04 DIAGNOSIS — K5900 Constipation, unspecified: Secondary | ICD-10-CM | POA: Diagnosis not present

## 2021-12-04 DIAGNOSIS — J9601 Acute respiratory failure with hypoxia: Secondary | ICD-10-CM | POA: Diagnosis not present

## 2021-12-04 LAB — CBC
HCT: 46.7 % — ABNORMAL HIGH (ref 36.0–46.0)
Hemoglobin: 14.7 g/dL (ref 12.0–15.0)
MCH: 29.7 pg (ref 26.0–34.0)
MCHC: 31.5 g/dL (ref 30.0–36.0)
MCV: 94.3 fL (ref 80.0–100.0)
Platelets: 672 10*3/uL — ABNORMAL HIGH (ref 150–400)
RBC: 4.95 MIL/uL (ref 3.87–5.11)
RDW: 13.8 % (ref 11.5–15.5)
WBC: 25.6 10*3/uL — ABNORMAL HIGH (ref 4.0–10.5)
nRBC: 0 % (ref 0.0–0.2)

## 2021-12-04 LAB — COMPREHENSIVE METABOLIC PANEL
ALT: 43 U/L (ref 0–44)
AST: 32 U/L (ref 15–41)
Albumin: 2.7 g/dL — ABNORMAL LOW (ref 3.5–5.0)
Alkaline Phosphatase: 134 U/L — ABNORMAL HIGH (ref 38–126)
Anion gap: 12 (ref 5–15)
BUN: 34 mg/dL — ABNORMAL HIGH (ref 8–23)
CO2: 30 mmol/L (ref 22–32)
Calcium: 9.3 mg/dL (ref 8.9–10.3)
Chloride: 94 mmol/L — ABNORMAL LOW (ref 98–111)
Creatinine, Ser: 1.13 mg/dL — ABNORMAL HIGH (ref 0.44–1.00)
GFR, Estimated: 53 mL/min — ABNORMAL LOW (ref >60)
Glucose, Bld: 152 mg/dL — ABNORMAL HIGH (ref 70–99)
Potassium: 5.7 mmol/L — ABNORMAL HIGH (ref 3.5–5.1)
Sodium: 136 mmol/L (ref 135–145)
Total Bilirubin: 0.5 mg/dL (ref 0.3–1.2)
Total Protein: 6.4 g/dL — ABNORMAL LOW (ref 6.5–8.1)

## 2021-12-04 LAB — POTASSIUM: Potassium: 4.6 mmol/L (ref 3.5–5.1)

## 2021-12-04 LAB — SURGICAL PATHOLOGY

## 2021-12-04 LAB — PROCALCITONIN: Procalcitonin: 0.2 ng/mL

## 2021-12-04 MED ORDER — SODIUM CHLORIDE 0.9 % IV SOLN: INTRAVENOUS | Status: AC

## 2021-12-04 MED ORDER — MAGNESIUM HYDROXIDE 400 MG/5ML PO SUSP
30.0000 mL | Freq: Two times a day (BID) | ORAL | Status: DC
Start: 2021-12-04 — End: 2021-12-05
  Administered 2021-12-04 – 2021-12-05 (×2): 30 mL via ORAL
  Filled 2021-12-04 (×2): qty 30

## 2021-12-04 MED ORDER — NALOXEGOL OXALATE 25 MG PO TABS
25.0000 mg | ORAL_TABLET | Freq: Every day | ORAL | Status: DC
  Administered 2021-12-04 – 2021-12-05 (×2): 25 mg via ORAL
  Filled 2021-12-04 (×2): qty 1

## 2021-12-04 MED ORDER — SODIUM ZIRCONIUM CYCLOSILICATE 10 G PO PACK
10.0000 g | PACK | Freq: Two times a day (BID) | ORAL | Status: DC
  Administered 2021-12-04: 10 g via ORAL
  Filled 2021-12-04: qty 1

## 2021-12-04 MED ORDER — MIDODRINE HCL 5 MG PO TABS
5.0000 mg | ORAL_TABLET | Freq: Three times a day (TID) | ORAL | Status: DC
  Administered 2021-12-04 – 2021-12-05 (×4): 5 mg via ORAL
  Filled 2021-12-04 (×4): qty 1

## 2021-12-04 MED ORDER — LORAZEPAM 2 MG/ML IJ SOLN
0.5000 mg | Freq: Four times a day (QID) | INTRAMUSCULAR | Status: AC | PRN
  Administered 2021-12-05 (×2): 0.5 mg via INTRAVENOUS
  Filled 2021-12-04 (×2): qty 1

## 2021-12-04 MED ORDER — ADULT MULTIVITAMIN W/MINERALS CH
1.0000 | ORAL_TABLET | Freq: Every day | ORAL | Status: DC
  Administered 2021-12-04 – 2021-12-05 (×2): 1 via ORAL
  Filled 2021-12-04 (×2): qty 1

## 2021-12-04 MED ORDER — HYDROMORPHONE HCL 1 MG/ML IJ SOLN
1.0000 mg | Freq: Once | INTRAMUSCULAR | Status: AC | PRN
  Administered 2021-12-04: 1 mg via INTRAVENOUS
  Filled 2021-12-04: qty 1

## 2021-12-04 NOTE — Progress Notes (Signed)
[facility] PROGRESS NOTE   Carly Abbott PFX:[id number] DOB: 03-May-1956 DOA: 11/26/2021  PCP: Dion Body, MD  Brief History/Interval Summary:  66 y.o. female with medical history significant of depression, anxiety, MS, HTN.   Pt with recent admit also at [facility] for SOB, pleural effusion on 5/4.  Effusion drained but rapidly re-occurred and pt readmitted to [facility] 5/6.   Summary of stay at [facility]: - 5/8: Pleurx catheter placed by interventional radiology, pleural fluid came back metastatic adenocarcinoma with suspected lung origin.  oncology Dr. Tasia Catchings was reconsulted. - 11/26/2021 CT chest/2D echo confirmed significant pericardial effusion , patient transferred to [facility] for evaluation by CT surgery for pericardial window.  .  Significant events at [facility] - 5/16 status post TEE with pericardial window with thoracotomy approach   Consultants:  Medical oncology at [facility]: Dr. Tasia Catchings.   Interventional radiology at [facility]  Cardiology at [facility]: Dr. Nehemiah Massed. Cardiothoracic surgery here at [place]   Procedures:  - Pleurx catheter insertion by IR on 5/8 at [facility] - Dr. Kipp Brood 11/27/2021 TRANSESOPHAGEAL ECHOCARDIOGRAM (TEE) (N/A) PERICARDIAL WINDOW WITH THORACOTOMY APPROACH (Right)  Subjective/Interval History:  Patient still reports constipation, no bowel movements, she still complaining of pain in chest tube insertion site .  Uncomfortable.  Assessment/Plan:  Pericardial effusion - Echo shows large pericardial effusion but no tamponade physiology . Pt transferred from [facility] to [facility] for pericardial window.  - Effusion most likely malignant in etiology. -CT surgery input greatly appreciated, status post TEE and pericardial window with thoracotomy approach by Dr. Kipp Brood 5/16, postoperative management per CT surgery, continue continue with as needed pain regimen, continue with chest tube, remains on CT ICU.  Chest tube to be discontinued  today. -Patient with worsening significant leukocytosis, is most likely due to steroids she received, her procalcitonin is reassuring.  Acute respiratory failure with hypoxia (HCC) -She remains on 4 to 6 L nasal cannula -Peritrate status very tenuous, she was encouraged to use incentive spirometer.   Metastatic lung adenocarcinoma ([place]) - Pt with malignant pleural effusion on cytology.  Likely lung origin.  As discussed with Dr.Yu from [facility], as it is TTF+, she is a lung markers, and favors lung origin.  - Will need outpatient follow-up with him after discharge. Per last note by Dr. Tasia Catchings, prognosis is thought to be poor.  Waiting on further studies to see if she will be a candidate for immunotherapy.  Patient told him that she was leaning towards no chemotherapy.  Could be a candidate for hospice going forward but will depend on results of further test that were ordered. Started on cough suppressants.  Patient's status noted to be tenuous.  Palliative care is following.  AKI -Fattening increased up to 1.13 today, avoid hypotension, I did increase midodrine we will start on IV fluids  Hyperkalemia -We will give Lokelma today  Constipation - Acute constipation noted on CT. started on aggressive bowel regimen.  There was some question of gastric outlet obstruction but patient without any symptoms.  -She remains with no bowel movement despite bowel regimen, will add milk of magnesia, and will add Movantik today as well. -CT abdomen and pelvis with large stool burden in the cecum, with decompression of transverse and more distal colon, concerning for constipating lesions within the transverse colon near the hepatic flexure, patient had colonoscopy in 2017 with no acute findings, her findings most likely related to severe constipation.  Hypotension Pt had tachycardia and hypotension during stay at [facility],  started on midodrine.  Blood pressures have improved.  Blood pressure noted to be in the  hypertensive range now.  Cut back on midodrine.   Community-acquired pneumonia CT at [facility] showing PNA.  Was placed on ceftriaxone and azithromycin.  Completed course of antibiotics.    Pleural effusion on right Patient underwent thoracentesis on 5/5 with 1.2 L fluid removed.  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  IR placed a Pleurx drainage catheter on 5/8. Experiencing significant pain and discomfort.  Was started on OxyContin 10 mg twice a day and oxycodone as needed.  Continue cough suppressants.  May need to increase the dose of the narcotics.  Consider doing so after her procedure. Drainage per the Plurex order set.    Subclinical hypothyroidism TSH noted to be elevated at 9.7.  Patient mentions that she has a longstanding history of subclinical hypothyroidism.  Free T4 noted to be 0.94.  No further work-up at this time.  Abnormal LFTs Mildly elevated AST and ALT level noted.  Only done CT scan did not show any liver lesions.   -Trending down  Protein-calorie malnutrition, severe In setting of metastatic CA   Anxiety and depression Continue zoloft and valium.  History of multiple sclerosis Stable.  Uses Flexeril at home.  Goals of care Palliative care continues to follow.      DVT Prophylaxis: Lovenox Code Status: DNR.  DNR  Family Communication: Brother at bedside Disposition Plan: To be determined.   Status is: Inpatient Remains inpatient appropriate because: Pericardial effusion requiring pericardial window      Medications: Scheduled:  benzonatate  200 mg Oral TID   bisacodyl  10 mg Rectal Daily   Chlorhexidine Gluconate Cloth  6 each Topical Daily   cyclobenzaprine  10 mg Oral BID   enoxaparin (LOVENOX) injection  30 mg Subcutaneous Q24H   feeding supplement  237 mL Oral TID BM   lactulose  30 g Oral BID   magnesium hydroxide  30 mL Oral BID   methocarbamol  500 mg Oral Q8H   midodrine  5 mg Oral TID WC   naloxegol oxalate  25 mg Oral Daily    oxybutynin  5 mg Oral BID   oxyCODONE  10 mg Oral Q12H   polyethylene glycol  17 g Oral BID   Ensure Max Protein  11 oz Oral Daily   senna-docusate  2 tablet Oral BID   sertraline  50 mg Oral Daily   sodium zirconium cyclosilicate  10 g Oral BID   Continuous:  sodium chloride 100 mL/hr at 12/04/21 0900    DXA:JOINOMVEHMCNO **OR** acetaminophen, chlorpheniramine-HYDROcodone, diazepam, lip balm, morphine (PF), ondansetron **OR** ondansetron (ZOFRAN) IV, oxyCODONE, sodium phosphate  Antibiotics: Anti-infectives (From admission, onward)    Start     Dose/Rate Route Frequency Ordered Stop   12/01/21 0801  ceFAZolin (ANCEF) IVPB 2g/100 mL premix        2 g 200 mL/hr over 30 Minutes Intravenous 30 min pre-op 12/01/21 0801 11/23/2021 1337   11/24/2021 1000  azithromycin (ZITHROMAX) tablet 250 mg        250 mg Oral Daily 11/29/2021 0122 12/01/21 1049   11/24/2021 1000  cefTRIAXone (ROCEPHIN) 2 g in sodium chloride 0.9 % 100 mL IVPB        2 g 200 mL/hr over 30 Minutes Intravenous Every 24 hours 11/17/2021 0122 12/01/21 1130       Objective:  Vital Signs  Vitals:   12/04/21 0800 12/04/21 0900 12/04/21 1000 12/04/21 1006  BP: 105/76  113/64 99/60   Pulse: (!) 101 96 100 94  Resp: 16 20 18 20   Temp:      TempSrc:      SpO2: 96% 93% 96% 93%  Weight:      Height:        Intake/Output Summary (Last 24 hours) at 12/04/2021 1011 Last data filed at 12/04/2021 0900 Gross per 24 hour  Intake 689.99 ml  Output 765 ml  Net -75.01 ml   Filed Weights   12/14/2021 0400 12/03/21 0359 12/04/21 0600  Weight: 41.2 kg 42.8 kg 42.5 kg    Chronic ill-appearing, cachectic female, sitting in bed with discomfort, fatigue, distracted . Diminished air entry at the bases bilaterally, with coarse respiratory sounds. RRR,No Gallops,Rubs or new Murmurs, No Parasternal Heave +ve B.Sounds, Abd Soft, No tenderness, No rebound - guarding or rigidity. No Cyanosis, Clubbing or edema, No new Rash or bruise       Lab Results:  Data Reviewed: I have personally reviewed following labs and reports of the imaging studies  CBC: Recent Labs  Lab 11/28/21 0637 12/01/21 0422 11/29/2021 1545 12/03/21 0435 12/04/21 0409  WBC 13.3* 17.2*  --  20.2* 25.6*  HGB 12.4 14.6 14.3 13.5 14.7  HCT 38.2 46.0 42.0 42.9 46.7*  MCV 91.0 92.9  --  94.9 94.3  PLT 565* 625*  --  559* 672*    Basic Metabolic Panel: Recent Labs  Lab 11/28/21 0637 12/01/21 0422 11/29/2021 1545 12/03/21 0435 12/04/21 0409  NA 136 137 135 134* 136  K 4.5 5.1 4.4 5.1 5.7*  CL 99 94*  --  99 94*  CO2 29 33*  --  26 30  GLUCOSE 131* 95  --  125* 152*  BUN 19 28*  --  24* 34*  CREATININE 0.61 0.73  --  0.59 1.13*  CALCIUM 9.0 9.4  --  8.6* 9.3    GFR: Estimated Creatinine Clearance: 31.2 mL/min (A) (by C-G formula based on SCr of 1.13 mg/dL (H)).    Recent Results (from the past 240 hour(s))  Surgical pcr screen     Status: None   Collection Time: 12/13/2021  8:08 AM   Specimen: Nasal Mucosa; Nasal Swab  Result Value Ref Range Status   MRSA, PCR NEGATIVE NEGATIVE Final   Staphylococcus aureus NEGATIVE NEGATIVE Final    Comment: (NOTE) The Xpert SA Assay (FDA approved for NASAL specimens in patients 21 years of age and older), is one component of a comprehensive surveillance program. It is not intended to diagnose infection nor to guide or monitor treatment. Performed at [facility], [facility] [address] [place], [place] [address]        Radiology Studies: DG Chest 1 View  Result Date: 11/27/2021 CLINICAL DATA:  Follow-up right pneumothorax and atelectasis. EXAM: CHEST  1 VIEW COMPARISON:  Prior today FINDINGS: A new right jugular central venous catheter seen with tip overlying the superior cavoatrial junction. Right-sided chest tube remains in place. No pneumothorax visualized on the current exam. Subcutaneous emphysema is noted in the left chest wall. Bibasilar infiltrates or atelectasis, right side greater  than left, show no significant change. Heart size remains stable. IMPRESSION: New right jugular central venous catheter in appropriate position. No pneumothorax visualized. Stable bibasilar infiltrates or atelectasis, right side greater than left. Electronically Signed   By: Marlaine Hind M.D.   On: 11/17/2021 14:53   DG CHEST PORT 1 VIEW  Result Date: 12/03/2021 CLINICAL DATA:  Reason for exam: pericardial effusion,  surgery follow up Hx of pericardial effusion. Hx of PERICARDIAL WINDOW WITH THORACOTOMY right - yesterday. EXAM: PORTABLE CHEST - 1 VIEW COMPARISON:  the previous day's study FINDINGS: Stable right PleurX without significant effusion or pneumothorax. Stable right IJ catheter to the mid right atrium. Pericardial drain remains in place. Coarse bibasilar airspace opacities right greater than left, show some improvement since prior study. Heart size and mediastinal contours are within normal limits. Blunting of the lateral costophrenic angles as before. Cholecystectomy clips. IMPRESSION: 1. Stable support hardware 2. Improving bibasilar coarse airspace opacities. Electronically Signed   By: Lucrezia Europe M.D.   On: 12/03/2021 06:05       LOS: 4 days   Carly Abbott  [facility] Pager on [email or web address]  12/04/2021, 10:11 AM

## 2021-12-04 NOTE — Progress Notes (Signed)
Nutrition Follow-up  DOCUMENTATION CODES:   Severe malnutrition in context of chronic illness  INTERVENTION:   Recommend considering Cortrak placement if aligns with GOC. Noted discussions ongoing. Discussed with Attending MD -If TF initiated, would need to start with caution given severe constipation with no BM since 5/09  Continue Regular diet  Continue Ensure Enlive po TID, each supplement provides 350 kcal and 20 grams of protein.  D/C Ensure Max  Add MVI with Minerals   NUTRITION DIAGNOSIS:   Severe Malnutrition related to chronic illness as evidenced by severe fat depletion, severe muscle depletion.  Being addressed  GOAL:   Patient will meet greater than or equal to 90% of their needs  Not Met  MONITOR:   PO intake, Supplement acceptance, Weight trends, Labs, Skin, I & O's  REASON FOR ASSESSMENT:   Malnutrition Screening Tool    ASSESSMENT:   66 y.o. female with medical history significant of depression, anxiety, MS, HTN. Pt transferred from [facility] to [facility] for pericardial window. Echo shows large pericardial effusion. 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 CT revealed PNA.  5/08 Pleurex cath placed by IR, pleural fluid returned as metastatic adenocarcinoma-suspected lung origin 5/16 Pericardial window with thoracotomy, evacuation of pericardial effusion  Chest tube still in place, pt also with PleurEx cath  Upon assessment today, pt with chin resting on tray table on a folded blanket. Cousin at bedside. Pt very sleepy. Per RN, this is how pt is most comfortable, pt is unable to lie back due to pain. Pt is very weak; per RN, pt is essentially unable to move without inducing pain.   Pt is not eating; did not eat breakfast this AM, drank some sips of Ensure. Recorded po inake 0-25%. Family reports pt with decline in appetite and po intake over the last months. Pt reporting a sudden change in taste; pt with no   Pt with significant constipation, noted on  CT, pt also requiring scheduled pain meds; Pt with orders for dulcolax suppository, lactulose, movantik,  miralax, senna-docusate, fleets enema.   Potassium up to 5.7; lokelma ordered.  Noted Creatinine trending up  Palliative Care is following; pt is DNR. Waiting on further studies to see if pt is candidate for treatment. On visit today, cousin reports pt's goal is to get stronger so she can go home to be with her cats and dog. Pt confirms this.   Labs: potassium 5.7 (H), Creatinine 1.13, BUN 34 Meds: reviewed  NUTRITION - FOCUSED PHYSICAL EXAM:  Flowsheet Row Most Recent Value  Orbital Region Severe depletion  Upper Arm Region Mild depletion  Thoracic and Lumbar Region Moderate depletion  Buccal Region Severe depletion  Temple Region Severe depletion  Clavicle Bone Region Severe depletion  Clavicle and Acromion Bone Region Severe depletion  Scapular Bone Region Severe depletion  Dorsal Hand Moderate depletion  Patellar Region Moderate depletion  Anterior Thigh Region Moderate depletion  Posterior Calf Region Moderate depletion       Diet Order:   Diet Order             Diet regular Room service appropriate? Yes; Fluid consistency: Thin  Diet effective now                   EDUCATION NEEDS:   Not appropriate for education at this time  Skin:  Skin Assessment: Skin Integrity Issues: Skin Integrity Issues:: Incisions Incisions: back  Last BM:  5/9  Height:   Ht Readings from Last 1 Encounters:  11/19/2021 4' 10"  (1.473 m)    Weight:   Wt Readings from Last 1 Encounters:  12/04/21 42.5 kg    BMI:  Body mass index is 19.58 kg/m.  Estimated Nutritional Needs:   Kcal:  1550-1750  Protein:  80-90 grams  Fluid:  >/= 1.5 L/day  Kerman Passey MS, RDN, LDN, CNSC Registered Dietitian III Clinical Nutrition RD Pager and On-Call Pager Number Located in [facility]

## 2021-12-04 NOTE — Progress Notes (Signed)
  Patient ID: Carly Abbott, female   DOB: 09-26-1955, 66 y.o.   MRN: [id number]    Progress Note from the Palliative Medicine Team at [facility]   Patient Name: Carly Abbott        Date: 12/04/2021 DOB: [birth year]  Age: 66 y.o. MRN#: [id number] Attending Physician: Lajuana Matte, MD Primary Care Physician: Dion Body, MD Admit Date: 12/12/2021   Medical records reviewed   66 y.o. female   admitted on 12/03/2021 with past medical history significant for depression with anxiety,[place] multiple sclerosis, vestibular schwannoma, hypertension, and hyperlipidemia and recent admission for shortness of breath and found to have a right pleural effusion, significant for positive pathology for adenocarcinoma with suspected lung origin   Pt with recent admit also at [facility] for SOB, pleural effusion on 5/4.  Effusion drained but rapidly re-occurred and pt readmitted to [facility] 5/6.   5/7: requiring low dose morphine to control dyspnea. 5/8: Pleurx catheter placed by interventional radiology. 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  Dr. Tasia Catchings was reconsulted. 11/26/2021 CT scan of the chest showed no evidence of pulmonary embolism or thoracic dissection.  Showed a moderate to large pericardial effusion and small bilateral pleural effusions.  Infiltrate seen in both lungs and COPD seen.  Oncology consult note/Dr.Yu reflects likely overall poor prognosis, ongoing conversation regarding treatment options.   11/28/21--Echocardiogram resulted on showed a moderate pericardial effusion with normal ejection fraction and no signs of pericardial tamponade.   Cardiothoracic surgery consultation for pericardial window to reduce the amount of pericardial effusion and reduce the potential risk of pericardial tamponade.  Transferred to [facility] for pericardial window 11/19/2021- Pericardial window and thoracotomy   Patient is currently in the ICU,  extremely weak s/p pericardial window and thoracotomy    Patient and her family face ongoing treatment option decisions, advanced directives decisions and anticipatory care needs.  This NP visited patient at the bedside as a follow up for palliative medicine needs meeting, and to continue conversation regarding next steps in her treatment plan.  Patient is so weak, voice is barely audible and unfortunately her brother/HPOA, or her cousin Juliann Pulse was never at the bedside when I tried to visit multiple time.   I left a voice message for Clyde/brother/HPOA and my contact information.  Her long life friend/Vickie is at bedside.  Education offered on patient's current medical situation and associated poor prognosis.  Carly Abbott was able to tell me that "home" is where she wants to be.    If this is the direction we are moving it will need to be coordinated with family.  PMT will continue to try to met with family tomorrow  Questions and concerns addressed      PMT will continue to support holistically.  Discussed with bedside RN and Dr Waldron Labs  Wadie Lessen NP  Palliative Medicine Team Team Phone # [phone] Pager [phone]

## 2021-12-04 NOTE — Progress Notes (Addendum)
[address]       [place] [address]             [phone]      2 Days Post-Op Procedure(s) (LRB): TRANSESOPHAGEAL ECHOCARDIOGRAM (TEE) (N/A) PERICARDIAL WINDOW WITH THORACOTOMY APPROACH (Right) Subjective: Appears uncomfortable  Objective: Vital signs in last 24 hours: Temp:  [97.3 F (36.3 C)-98.3 F (36.8 C)] 97.6 F (36.4 C) (05/18 0325) Pulse Rate:  [49-142] 97 (05/18 0700) Cardiac Rhythm: Normal sinus rhythm;Sinus tachycardia (05/18 0400) Resp:  [10-26] 16 (05/18 0700) BP: (82-117)/(48-92) 111/48 (05/18 0700) SpO2:  [67 %-100 %] 77 % (05/18 0700) Arterial Line BP: (130-151)/(78-89) 136/89 (05/17 1430) Weight:  [42.5 kg] 42.5 kg (05/18 0600)  Hemodynamic parameters for last 24 hours:    Intake/Output from previous day: 05/17 0701 - 05/18 0700 In: 780 [P.O.:780] Out: 915 [Urine:725; Chest Tube:190] Intake/Output this shift: No intake/output data recorded.  General appearance: distracted, fatigued, and mild distress Heart: tachy Lungs: coarse BS  Lab Results: Recent Labs    12/03/21 0435 12/04/21 0409  WBC 20.2* 25.6*  HGB 13.5 14.7  HCT 42.9 46.7*  PLT 559* 672*   BMET:  Recent Labs    12/03/21 0435 12/04/21 0409  NA 134* 136  K 5.1 5.7*  CL 99 94*  CO2 26 30  GLUCOSE 125* 152*  BUN 24* 34*  CREATININE 0.59 1.13*  CALCIUM 8.6* 9.3    PT/INR: No results for input(s): LABPROT, INR in the last 72 hours. ABG    Component Value Date/Time   PHART 7.312 (L) 11/17/2021 1545   HCO3 30.3 (H) 12/03/2021 1545   TCO2 32 11/23/2021 1545   O2SAT 90 12/04/2021 1545   CBG (last 3)  No results for input(s): GLUCAP in the last 72 hours.  Meds Scheduled Meds:  benzonatate  200 mg Oral TID   bisacodyl  10 mg Rectal Daily   Chlorhexidine Gluconate Cloth  6 each Topical Daily   cyclobenzaprine  10 mg Oral BID   enoxaparin (LOVENOX) injection  30 mg Subcutaneous Q24H   feeding supplement  237 mL Oral TID BM   lactulose  30 g  Oral BID   methocarbamol  500 mg Oral Q8H   midodrine  5 mg Oral TID WC   oxybutynin  5 mg Oral BID   oxyCODONE  10 mg Oral Q12H   polyethylene glycol  17 g Oral BID   Ensure Max Protein  11 oz Oral Daily   senna-docusate  2 tablet Oral BID   sertraline  50 mg Oral Daily   sodium zirconium cyclosilicate  10 g Oral BID   Continuous Infusions:  sodium chloride     PRN Meds:.acetaminophen **OR** acetaminophen, chlorpheniramine-HYDROcodone, diazepam, lip balm, morphine (PF), ondansetron **OR** ondansetron (ZOFRAN) IV, oxyCODONE, sodium phosphate  Xrays DG Chest 1 View  Result Date: 11/25/2021 CLINICAL DATA:  Follow-up right pneumothorax and atelectasis. EXAM: CHEST  1 VIEW COMPARISON:  Prior today FINDINGS: A new right jugular central venous catheter seen with tip overlying the superior cavoatrial junction. Right-sided chest tube remains in place. No pneumothorax visualized on the current exam. Subcutaneous emphysema is noted in the left chest wall. Bibasilar infiltrates or atelectasis, right side greater than left, show no significant change. Heart size remains stable. IMPRESSION: New right jugular central venous catheter in appropriate position. No pneumothorax visualized. Stable bibasilar infiltrates or atelectasis, right side greater than left. Electronically Signed   By: Marlaine Hind M.D.   On:  12/01/2021 14:53   DG CHEST PORT 1 VIEW  Result Date: 12/03/2021 CLINICAL DATA:  Reason for exam: pericardial effusion, surgery follow up Hx of pericardial effusion. Hx of PERICARDIAL WINDOW WITH THORACOTOMY right - yesterday. EXAM: PORTABLE CHEST - 1 VIEW COMPARISON:  the previous day's study FINDINGS: Stable right PleurX without significant effusion or pneumothorax. Stable right IJ catheter to the mid right atrium. Pericardial drain remains in place. Coarse bibasilar airspace opacities right greater than left, show some improvement since prior study. Heart size and mediastinal contours are within  normal limits. Blunting of the lateral costophrenic angles as before. Cholecystectomy clips. IMPRESSION: 1. Stable support hardware 2. Improving bibasilar coarse airspace opacities. Electronically Signed   By: Lucrezia Europe M.D.   On: 12/03/2021 06:05    Assessment/Plan: S/P Procedure(s) (LRB): TRANSESOPHAGEAL ECHOCARDIOGRAM (TEE) (N/A) PERICARDIAL WINDOW WITH THORACOTOMY APPROACH (Right)  POD#2 1 afeb, sats , BP and HR quite variable 2 CT 190 cc- tube to be removed today 3 medical management per primary    LOS: 4 days    John Giovanni PA-C Pager [phone] 12/04/2021   Agree with above Will remove CT Continue IS/Ambulation  Jolly Bleicher O Mandy Fitzwater

## 2021-12-05 ENCOUNTER — Inpatient Hospital Stay (HOSPITAL_COMMUNITY): Payer: Medicare Other

## 2021-12-05 DIAGNOSIS — I3139 Other pericardial effusion (noninflammatory): Secondary | ICD-10-CM | POA: Diagnosis not present

## 2021-12-05 DIAGNOSIS — Z7189 Other specified counseling: Secondary | ICD-10-CM

## 2021-12-05 DIAGNOSIS — C799 Secondary malignant neoplasm of unspecified site: Secondary | ICD-10-CM | POA: Diagnosis not present

## 2021-12-05 DIAGNOSIS — K5900 Constipation, unspecified: Secondary | ICD-10-CM | POA: Diagnosis not present

## 2021-12-05 DIAGNOSIS — J9601 Acute respiratory failure with hypoxia: Secondary | ICD-10-CM | POA: Diagnosis not present

## 2021-12-05 DIAGNOSIS — J90 Pleural effusion, not elsewhere classified: Secondary | ICD-10-CM | POA: Diagnosis not present

## 2021-12-05 LAB — CBC
HCT: 41.7 % (ref 36.0–46.0)
Hemoglobin: 13.3 g/dL (ref 12.0–15.0)
MCH: 29.9 pg (ref 26.0–34.0)
MCHC: 31.9 g/dL (ref 30.0–36.0)
MCV: 93.7 fL (ref 80.0–100.0)
Platelets: 552 10*3/uL — ABNORMAL HIGH (ref 150–400)
RBC: 4.45 MIL/uL (ref 3.87–5.11)
RDW: 13.7 % (ref 11.5–15.5)
WBC: 18.6 10*3/uL — ABNORMAL HIGH (ref 4.0–10.5)
nRBC: 0 % (ref 0.0–0.2)

## 2021-12-05 LAB — TYPE AND SCREEN
ABO/RH(D): O POS
Antibody Screen: NEGATIVE
Unit division: 0

## 2021-12-05 LAB — COMPREHENSIVE METABOLIC PANEL
ALT: 30 U/L (ref 0–44)
AST: 31 U/L (ref 15–41)
Albumin: 2.4 g/dL — ABNORMAL LOW (ref 3.5–5.0)
Alkaline Phosphatase: 118 U/L (ref 38–126)
Anion gap: 10 (ref 5–15)
BUN: 31 mg/dL — ABNORMAL HIGH (ref 8–23)
CO2: 33 mmol/L — ABNORMAL HIGH (ref 22–32)
Calcium: 8.7 mg/dL — ABNORMAL LOW (ref 8.9–10.3)
Chloride: 92 mmol/L — ABNORMAL LOW (ref 98–111)
Creatinine, Ser: 0.73 mg/dL (ref 0.44–1.00)
GFR, Estimated: >60 mL/min (ref >60)
Glucose, Bld: 134 mg/dL — ABNORMAL HIGH (ref 70–99)
Potassium: 4.5 mmol/L (ref 3.5–5.1)
Sodium: 135 mmol/L (ref 135–145)
Total Bilirubin: 0.6 mg/dL (ref 0.3–1.2)
Total Protein: 5.9 g/dL — ABNORMAL LOW (ref 6.5–8.1)

## 2021-12-05 LAB — BPAM RBC
Blood Product Expiration Date: 202306072359
Unit Type and Rh: 5100

## 2021-12-05 LAB — PROCALCITONIN: Procalcitonin: 0.1 ng/mL

## 2021-12-05 MED ORDER — MIDODRINE HCL 5 MG PO TABS
5.0000 mg | ORAL_TABLET | Freq: Three times a day (TID) | ORAL | Status: DC
  Administered 2021-12-05 – 2021-12-07 (×5): 5 mg
  Filled 2021-12-05 (×6): qty 1

## 2021-12-05 MED ORDER — ADULT MULTIVITAMIN W/MINERALS CH
1.0000 | ORAL_TABLET | Freq: Every day | ORAL | Status: DC
  Administered 2021-12-06: 1
  Filled 2021-12-05: qty 1

## 2021-12-05 MED ORDER — ENSURE ENLIVE PO LIQD
237.0000 mL | Freq: Three times a day (TID) | ORAL | Status: DC
  Administered 2021-12-06: 237 mL

## 2021-12-05 MED ORDER — CYCLOBENZAPRINE HCL 5 MG PO TABS
10.0000 mg | ORAL_TABLET | Freq: Two times a day (BID) | ORAL | Status: DC
  Administered 2021-12-05 – 2021-12-07 (×4): 10 mg
  Filled 2021-12-05 (×2): qty 1
  Filled 2021-12-05: qty 2
  Filled 2021-12-05: qty 1
  Filled 2021-12-05: qty 2

## 2021-12-05 MED ORDER — LACTULOSE 10 GM/15ML PO SOLN
30.0000 g | Freq: Three times a day (TID) | ORAL | Status: DC
  Administered 2021-12-05 – 2021-12-06 (×3): 30 g
  Filled 2021-12-05 (×3): qty 45

## 2021-12-05 MED ORDER — OXYCODONE HCL 5 MG PO TABS
5.0000 mg | ORAL_TABLET | Freq: Four times a day (QID) | ORAL | Status: DC
  Administered 2021-12-05 – 2021-12-07 (×5): 5 mg
  Filled 2021-12-05 (×7): qty 1

## 2021-12-05 MED ORDER — OXYBUTYNIN CHLORIDE 5 MG PO TABS
5.0000 mg | ORAL_TABLET | Freq: Two times a day (BID) | ORAL | Status: DC
  Administered 2021-12-05 – 2021-12-07 (×3): 5 mg
  Filled 2021-12-05 (×4): qty 1

## 2021-12-05 MED ORDER — METHOCARBAMOL 500 MG PO TABS
500.0000 mg | ORAL_TABLET | Freq: Three times a day (TID) | ORAL | Status: DC
  Administered 2021-12-05 – 2021-12-07 (×6): 500 mg
  Filled 2021-12-05 (×6): qty 1

## 2021-12-05 MED ORDER — MAGNESIUM HYDROXIDE 400 MG/5ML PO SUSP
30.0000 mL | Freq: Two times a day (BID) | ORAL | Status: DC
Start: 2021-12-05 — End: 2021-12-08
  Administered 2021-12-05 – 2021-12-07 (×4): 30 mL
  Filled 2021-12-05 (×4): qty 30

## 2021-12-05 MED ORDER — SENNOSIDES-DOCUSATE SODIUM 8.6-50 MG PO TABS
2.0000 | ORAL_TABLET | Freq: Two times a day (BID) | ORAL | Status: DC
  Administered 2021-12-05 – 2021-12-07 (×4): 2
  Filled 2021-12-05 (×4): qty 2

## 2021-12-05 MED ORDER — SERTRALINE HCL 50 MG PO TABS
50.0000 mg | ORAL_TABLET | Freq: Every day | ORAL | Status: DC
  Administered 2021-12-06 – 2021-12-07 (×2): 50 mg
  Filled 2021-12-05 (×2): qty 1

## 2021-12-05 MED ORDER — SODIUM CHLORIDE 0.9 % IV SOLN
12.5000 mg | Freq: Four times a day (QID) | INTRAVENOUS | Status: DC | PRN
  Filled 2021-12-05: qty 0.5

## 2021-12-05 MED ORDER — LACTULOSE 10 GM/15ML PO SOLN: 30.0000 g | Freq: Two times a day (BID) | ORAL | Status: DC

## 2021-12-05 MED ORDER — NALOXEGOL OXALATE 25 MG PO TABS
25.0000 mg | ORAL_TABLET | Freq: Every day | ORAL | Status: AC
  Administered 2021-12-06: 25 mg via NASOGASTRIC
  Filled 2021-12-05: qty 1

## 2021-12-05 MED ORDER — SORBITOL 70 % SOLN
960.0000 mL | TOPICAL_OIL | Freq: Once | ORAL | Status: AC
  Administered 2021-12-05: 960 mL via RECTAL
  Filled 2021-12-05: qty 473

## 2021-12-05 MED ORDER — BISACODYL 10 MG RE SUPP
10.0000 mg | Freq: Two times a day (BID) | RECTAL | Status: DC
  Administered 2021-12-05: 10 mg via RECTAL
  Filled 2021-12-05: qty 1

## 2021-12-05 MED ORDER — POLYETHYLENE GLYCOL 3350 17 G PO PACK
17.0000 g | PACK | Freq: Two times a day (BID) | ORAL | Status: DC
  Administered 2021-12-05 – 2021-12-07 (×4): 17 g
  Filled 2021-12-05 (×4): qty 1

## 2021-12-05 NOTE — Progress Notes (Addendum)
[employer] [facility]) Hospital Liaison Note  Referral received for patient/family interest in home with hospice. [place] liaison spoke with patient's brother Savoy. Interest confirmed.   Hospice eligibility confirmed.   Plan is to discharge once pain is managed.   DME in the home: walker  DME needs: hospital bed, over the bed table, BSC, and oxygen. Potentially CADD pump  Please send comfort medications/prescriptions with patient at discharge.   Please call with any questions or concerns. Thank you  Roselee Nova, [place] Hospital Liaison [phone]

## 2021-12-05 NOTE — Progress Notes (Signed)
Palliative Medicine Progress Note   Patient Name: Carly Abbott       Date: 12/05/2021 DOB: 07-18-1956  Age: 66 y.o. MRN#: [id number] Attending Physician: Albertine Patricia, MD Primary Care Physician: Dion Body, MD Admit Date: 12/14/2021   HPI/Patient Profile: 66 y.o. female   admitted on 12/07/2021 with past medical history significant for depression with anxiety,Carly Abbott multiple sclerosis, vestibular schwannoma, hypertension, and hyperlipidemia and recent admission for shortness of breath and found to have a right pleural effusion, significant for positive pathology for adenocarcinoma with suspected lung origin   Pt with recent admit also at [facility] for SOB, pleural effusion on 5/4.  Effusion drained but rapidly re-occurred and pt readmitted to [facility] 5/6.   5/7: requiring low dose morphine to control dyspnea. 5/8: Pleurx catheter placed by interventional radiology. 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  Dr. Tasia Catchings was reconsulted. 11/26/2021 CT scan of the chest showed no evidence of pulmonary embolism or thoracic dissection.  Showed a moderate to large pericardial effusion and small bilateral pleural effusions.  Infiltrate seen in both lungs and COPD seen.  Oncology consult note/Dr.Yu reflects likely overall poor prognosis, ongoing conversation regarding treatment options.   11/28/21--Echocardiogram resulted on showed a moderate pericardial effusion with normal ejection fraction and no signs of pericardial tamponade.   Cardiothoracic surgery consultation for pericardial window to reduce the amount of pericardial effusion and reduce the potential risk of pericardial tamponade.  Transferred to [facility] for pericardial  window 11/17/2021- Pericardial window and thoracotomy    Subjective: Chart reviewed.  Update received from bedside RN.  I visited patient at bedside.  She remains in the ICU, and extremely weak.  Her voice is barely audible.  She had an NG tube placed this morning due to nausea and vomiting.  Her brother Carly Abbott is at bedside.  Continued conversation was had regarding diagnosis, prognosis, symptom management, and options for disposition.  I let Carly Abbott know that Carly Abbott had expressed to my colleague Carly Abbott yesterday that she wanted to be at "home".    The difference between full scope medical intervention and comfort care was considered.  Carly Abbott verbalizes understanding that her cancer is "metastatic".  She tells me that she does not want to do any more cancer treatment, including immunotherapy.   Reviewed the  concept of a comfort path with Carly Abbott and Carly Abbott, emphasizing that this path involves de-escalating and stopping full scope medical interventions, allowing a natural course to occur. Discussed that the goal is comfort and dignity rather than cure/prolonging life. Carly Abbott verbalizes understanding and expresses again that she wants to be at home with her cats and to be comfortable.  Provided education and counseling on the philosophy and benefits of hospice care.  Discussed the difference between home hospice versus residential hospice facility. Carly Abbott confirms that she wants to be at home.  Carly Abbott states that he will be leases primary caregiver at home.  He understands that hospice care is intermittent care only.  Discussed the hospice team includes RNs, physicians, social workers, and chaplains.  Carly Abbott is planning to hire a private caregiver as well.  Outside the room, I discussed with Carly Abbott that as a backup option, Carly Abbott could be transferred to a residential hospice facility if her symptoms were not being managed at home.  If this was needed, Carly Abbott would prefer she transfer to the hospice facility in [place] because  it is close to her home.  For this reason, he states his preference for Carly Abbott.   Plan of care was discussed with Dr. Waldron Labs.  Discussed that patient's fecal impaction needs to be at least partially resolved prior to discharge.  She is already on an aggressive bowel regimen, and he added a SMOG enema today.   Objective:  Physical Exam Vitals reviewed.  Constitutional:      Appearance: She is cachectic. She is ill-appearing.  Cardiovascular:     Rate and Rhythm: Normal rate.  Neurological:     Mental Status: She is alert.     Motor: Weakness present.            Vital Signs: BP 108/64   Pulse (!) 106   Temp (!) 97.5 F (36.4 C) (Oral)   Resp 16   Ht 4\' 10"  (1.473 m)   Wt 42.5 kg   SpO2 91%   BMI 19.58 kg/m  SpO2: SpO2: 91 % O2 Device: O2 Device: Nasal Cannula O2 Flow Rate: O2 Flow Rate (L/min): 6 L/min    LBM: Last BM Date :  (PTA)     Palliative Assessment/Data: PPS 20-30%     Palliative Medicine Assessment & Plan   Assessment: Principal Problem:   Pericardial effusion Active Problems:   Anxiety and depression   Pleural effusion on right   SIRS (systemic inflammatory response syndrome) (HCC)   Hypotension   Protein-calorie malnutrition, severe   Metastatic adenocarcinoma (HCC)   Acute respiratory failure with hypoxia (HCC)   Constipation    Recommendations/Plan: DNR/DNI as previously documented Continue all current interventions Plan for home with hospice - Carly Abbott Hopefully fecal impaction can resolve prior to discharge Would leave central line in place in case patient needs IV medications at home PMT will continue to follow  Prognosis: Very poor, possibly 2 to 4 weeks  Discharge Planning: Home with Hospice    Thank you for allowing the Palliative Medicine Team to assist in the care of this patient.    Greater than 50%  of this time was spent counseling and coordinating care related to the above assessment and plan.  Total  time: 12 minutes    Lavena Bullion, NP Palliative Medicine   Please contact Palliative Medicine Team phone at [phone] for questions and concerns.  For individual provider, see AMION.

## 2021-12-05 NOTE — TOC Progression Note (Addendum)
Transition of Care [facility]) - Progression Note    Patient Details  Name: Carly Abbott MRN: [id number] Date of Birth: [birth year]  Transition of Care [facility]) CM/SW Contact  Graves-Bigelow, Ocie Cornfield, RN Phone Number: 12/05/2021, 1:32 PM  Clinical Narrative:  Case Manager received secure chat from the Palliative NP and the patient has decided to go home with Hospice Services. Case Manager spoke with the patients brother and he will be her caregiver in the home. Family has decided on [facility]. Brother had a specific question regarding if the patient can transition to Residential if she can no longer be in the home. [facility] is aware and they will speak with the patient and brother regarding question. Patient will need DME HB, Oxygen, overbed table, and bedside commode- given to [facility] to order. Case Manager did call [facility] to cancel home health services. Case manager did provide the brother with resources for personal care services. Case Manager to follow for transportation needs.-patient will need PTAR.     Expected Discharge Plan: Home w Hospice Care Barriers to Discharge: Continued Medical Work up  Expected Discharge Plan and Services Expected Discharge Plan: [place]   Discharge Planning Services: CM Consult Post Acute Care Choice: Hospice Living arrangements for the past 2 months: Single Family Home                 DME Arranged: N/A         HH Arranged: RN HH Agency: [facility] [place] ([place]) Date HH Agency Contacted: 12/05/21 Time HH Agency Contacted: 1330 Representative spoke with at [place]: Shanita   Readmission Risk Interventions     View : No data to display.

## 2021-12-05 NOTE — Progress Notes (Signed)
[facility] PROGRESS NOTE   Carly Abbott TOI:[id number] DOB: 08/29/1955 DOA: 11/28/2021  PCP: Carly Body, MD  Brief History/Interval Summary:   66 y.o. female with medical history significant of depression, anxiety, MS, HTN.   Pt with recent admit also at [facility] for SOB, pleural effusion on 5/4.  Effusion drained but rapidly re-occurred and pt readmitted to [facility] 5/6.   Summary of stay at [facility]: - 5/8: Pleurx catheter placed by interventional radiology, pleural fluid came back metastatic adenocarcinoma with suspected lung origin.  oncology Dr. Tasia Abbott was reconsulted. - 11/26/2021 CT chest/2D echo confirmed significant pericardial effusion , patient transferred to [facility] for evaluation by CT surgery for pericardial window.  .  Significant events at [facility] - 5/16 status post TEE with pericardial window with thoracotomy approach -5/18, left side chest tube discontinued - 5/19, patient developed nausea and vomiting overnight, chest tube inserted due to concerns of bowel obstruction from severe constipation   Consultants:  Medical oncology at [facility]: Dr. Tasia Abbott.   Interventional radiology at [facility]  Cardiology at [facility]: Dr. Nehemiah Abbott. Cardiothoracic surgery here at [place]   Procedures:  - Pleurx catheter insertion by IR on 5/8 at [facility] - Dr. Kipp Abbott 11/29/2021 TRANSESOPHAGEAL ECHOCARDIOGRAM (TEE) (N/A) PERICARDIAL WINDOW WITH THORACOTOMY APPROACH (Right)  Subjective/Interval History:  Current with nausea, vomiting overnight, pain is better controlled currently.    Assessment/Plan:  Pericardial effusion - Echo shows large pericardial effusion but no tamponade physiology . Pt transferred from [facility] to [facility] for pericardial window.  - Effusion most likely malignant in etiology. -CT surgery input greatly appreciated, status post TEE and pericardial window with thoracotomy approach by Dr. Kipp Abbott 5/16, postoperative management per CT surgery,  continue continue with as needed pain regimen. -Chest tube discontinued 5/18 . -Leukocytosis trending down, leukocytosis most likely due to steroids, procalcitonin is reassuring.    Acute respiratory failure with hypoxia (HCC) -She remains on 4 to 6 L nasal cannula -Peritrate status very tenuous, she was encouraged to use incentive spirometer.   Metastatic lung adenocarcinoma ([place]) - Pt with malignant pleural effusion on cytology.  Likely lung origin.  As discussed with Dr.Yu from [facility], as it is TTF+, she is a lung markers, and favors lung origin.  - Will need outpatient follow-up with him after discharge. Per last note by Dr. Tasia Abbott, prognosis is thought to be poor.  Waiting on further studies to see if she will be a candidate for immunotherapy.  Patient told him that she was leaning towards no chemotherapy.  Could be a candidate for hospice going forward but will depend on results of further test that were ordered. Started on cough suppressants.  Patient's status noted to be tenuous.  Palliative care is following.  AKI -Creatinine trending down today, continue with IV fluids, continue with Midodrin, avoid hypotension.    Hyperkalemia -Resolved with Lokelma  Severe constipation - Acute constipation noted on CT. started on aggressive bowel regimen.  There was some question of gastric outlet obstruction but patient without any symptoms.  -She remains with no bowel movement despite aggressive bowel regimen, she is on MiraLAX, milk of magnesium, and received as needed suppositories, despite that remains with no BM, so add lactulose has been added, and Dulcolax suppository.  . -She is on Movantik as well . -CT abdomen and pelvis with large stool burden in the cecum, with decompression of transverse and more distal colon, concerning for constipating lesions within the transverse colon near the hepatic flexure, patient  had colonoscopy in 2017 with no acute findings, her findings most likely related to  severe constipation.  Nausea,/vomiting -She is with significant nausea and vomiting overnight, NGT inserted, will keep on ILS, this is most likely in the setting of severe constipation with no BM, and evidence of obstruction already present on CT scan 5/12. -With NGT on ILS, please see above discussion regarding constipation.  Hypotension Pt had tachycardia and hypotension during stay at [facility], started on midodrine.  Blood pressures have improved.  Blood pressure noted to be in the hypertensive range now.  Cut back on midodrine.   Community-acquired pneumonia CT at [facility] showing PNA.  Was placed on ceftriaxone and azithromycin.  Completed course of antibiotics.    Pleural effusion on right Patient underwent thoracentesis on 5/5 with 1.2 L fluid removed.  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  IR placed a Pleurx drainage catheter on 5/8. Experiencing significant pain and discomfort.  Was started on OxyContin 10 mg twice a day and oxycodone as needed.  Continue cough suppressants.  May need to increase the dose of the narcotics.  Consider doing so after her procedure. Drainage per the Plurex order set.    Subclinical hypothyroidism TSH noted to be elevated at 9.7.  Patient mentions that she has a longstanding history of subclinical hypothyroidism.  Free T4 noted to be 0.94.  No further work-up at this time.  Abnormal LFTs Mildly elevated AST and ALT level noted.  Only done CT scan did not show any liver lesions.   -Trending down  Protein-calorie malnutrition, severe In setting of metastatic CA   Anxiety and depression Continue zoloft and valium.  History of multiple sclerosis Stable.  Uses Flexeril at home.  Goals of care Palliative care continues to follow.      DVT Prophylaxis: Lovenox Code Status: DNR.  DNR  Family Communication: Brother at bedside Disposition Plan: To be determined.   Status is: Inpatient Remains inpatient appropriate because: Pericardial effusion  requiring pericardial window      Medications: Scheduled:  bisacodyl  10 mg Rectal BID   Chlorhexidine Gluconate Cloth  6 each Topical Daily   cyclobenzaprine  10 mg Per Tube BID   enoxaparin (LOVENOX) injection  30 mg Subcutaneous Q24H   feeding supplement  237 mL Per Tube TID BM   lactulose  30 g Per Tube TID   magnesium hydroxide  30 mL Per Tube BID   methocarbamol  500 mg Per Tube Q8H   midodrine  5 mg Per Tube TID WC   [START ON 12/06/2021] multivitamin with minerals  1 tablet Per Tube Daily   [START ON 12/06/2021] naloxegol oxalate  25 mg Per NG tube Daily   oxybutynin  5 mg Per Tube BID   oxyCODONE  5 mg Per Tube Q6H   polyethylene glycol  17 g Per Tube BID   senna-docusate  2 tablet Per Tube BID   [START ON 12/06/2021] sertraline  50 mg Per Tube Daily   Continuous:  promethazine (PHENERGAN) injection (IM or IVPB)      DDU:[id number] **OR** acetaminophen, chlorpheniramine-HYDROcodone, diazepam, lip balm, LORazepam, morphine (PF), ondansetron **OR** ondansetron (ZOFRAN) IV, oxyCODONE, promethazine (PHENERGAN) injection (IM or IVPB), sodium phosphate  Antibiotics: Anti-infectives (From admission, onward)    Start     Dose/Rate Route Frequency Ordered Stop   12/01/21 0801  ceFAZolin (ANCEF) IVPB 2g/100 mL premix        2 g 200 mL/hr over 30 Minutes Intravenous 30 min pre-op 12/01/21 0801 12/03/2021 1337  11/20/2021 1000  azithromycin (ZITHROMAX) tablet 250 mg        250 mg Oral Daily 11/29/2021 0122 12/01/21 1049   11/29/2021 1000  cefTRIAXone (ROCEPHIN) 2 g in sodium chloride 0.9 % 100 mL IVPB        2 g 200 mL/hr over 30 Minutes Intravenous Every 24 hours 11/24/2021 0122 12/01/21 1130       Objective:  Vital Signs  Vitals:   12/05/21 0400 12/05/21 0505 12/05/21 0700 12/05/21 1150  BP: (!) 137/99 (!) 88/52 108/64   Pulse:  (!) 106 (!) 106   Resp: 15 (!) 9 16   Temp: 98.3 F (36.8 C)   (!) 97.5 F (36.4 C)  TempSrc: Axillary   Oral  SpO2: 97% 98% 91%   Weight:       Height:        Intake/Output Summary (Last 24 hours) at 12/05/2021 1239 Last data filed at 12/05/2021 0630 Gross per 24 hour  Intake --  Output 420 ml  Net -420 ml   Filed Weights   12/03/2021 0400 12/03/21 0359 12/04/21 0600  Weight: 41.2 kg 42.8 kg 42.5 kg    Limited frail, deconditioned, cachectic, chronically ill-appearing  Symmetrical Chest wall movement, bibasilar Rales RRR,No Gallops,Rubs or new Murmurs, No Parasternal Heave +ve B.Sounds, abdomen mildly distended, nontender, No rebound - guarding or rigidity. No Cyanosis, Clubbing or edema, No new Rash or bruise       Lab Results:  Data Reviewed: I have personally reviewed following labs and reports of the imaging studies  CBC: Recent Labs  Lab 12/01/21 0422 12/13/2021 1545 12/03/21 0435 12/04/21 0409 12/05/21 0435  WBC 17.2*  --  20.2* 25.6* 18.6*  HGB 14.6 14.3 13.5 14.7 13.3  HCT 46.0 42.0 42.9 46.7* 41.7  MCV 92.9  --  94.9 94.3 93.7  PLT 625*  --  559* 672* 552*    Basic Metabolic Panel: Recent Labs  Lab 12/01/21 0422 12/10/2021 1545 12/03/21 0435 12/04/21 0409 12/04/21 2300 12/05/21 0435  NA 137 135 134* 136  --  135  K 5.1 4.4 5.1 5.7* 4.6 4.5  CL 94*  --  99 94*  --  92*  CO2 33*  --  26 30  --  33*  GLUCOSE 95  --  125* 152*  --  134*  BUN 28*  --  24* 34*  --  31*  CREATININE 0.73  --  0.59 1.13*  --  0.73  CALCIUM 9.4  --  8.6* 9.3  --  8.7*    GFR: Estimated Creatinine Clearance: 44.1 mL/min (by C-G formula based on SCr of 0.73 mg/dL).    Recent Results (from the past 240 hour(s))  Surgical pcr screen     Status: None   Collection Time: 12/03/2021  8:08 AM   Specimen: Nasal Mucosa; Nasal Swab  Result Value Ref Range Status   MRSA, PCR NEGATIVE NEGATIVE Final   Staphylococcus aureus NEGATIVE NEGATIVE Final    Comment: (NOTE) The Xpert SA Assay (FDA approved for NASAL specimens in patients 72 years of age and older), is one component of a comprehensive surveillance program. It is  not intended to diagnose infection nor to guide or monitor treatment. Performed at [facility], [place] [address], [place], [place] [address]        Radiology Studies: DG Abd Portable 1V  Result Date: 12/05/2021 CLINICAL DATA:  Nausea and vomiting. EXAM: PORTABLE ABDOMEN - 1 VIEW COMPARISON:  CT 11/28/2021 FINDINGS: Examination  demonstrates mild amount of residual contrast within the colon from patient's previous CT scan. There is significant fecal retention over the rectum unchanged. Several air-filled loops of bowel over the right upper quadrant without significant change. Some which may be minimally prominent small bowel loops. No definite free peritoneal air. Surgical clips over the right upper quadrant. Mild degenerate change of the spine and hips. IMPRESSION: Nonspecific, nonobstructive bowel gas pattern. Significant fecal retention over the rectum unchanged. Electronically Signed   By: Marin Olp M.D.   On: 12/05/2021 09:11       LOS: 5 days   Nikkol Pai  [facility] Pager on [email or web address]  12/05/2021, 12:39 PM

## 2021-12-06 ENCOUNTER — Inpatient Hospital Stay (HOSPITAL_COMMUNITY): Payer: Medicare Other

## 2021-12-06 DIAGNOSIS — I3139 Other pericardial effusion (noninflammatory): Secondary | ICD-10-CM | POA: Diagnosis not present

## 2021-12-06 DIAGNOSIS — K5900 Constipation, unspecified: Secondary | ICD-10-CM | POA: Diagnosis not present

## 2021-12-06 DIAGNOSIS — J9601 Acute respiratory failure with hypoxia: Secondary | ICD-10-CM | POA: Diagnosis not present

## 2021-12-06 DIAGNOSIS — C799 Secondary malignant neoplasm of unspecified site: Secondary | ICD-10-CM | POA: Diagnosis not present

## 2021-12-06 LAB — CBC
HCT: 42.2 % (ref 36.0–46.0)
Hemoglobin: 13.4 g/dL (ref 12.0–15.0)
MCH: 29.8 pg (ref 26.0–34.0)
MCHC: 31.8 g/dL (ref 30.0–36.0)
MCV: 94 fL (ref 80.0–100.0)
Platelets: 584 10*3/uL — ABNORMAL HIGH (ref 150–400)
RBC: 4.49 MIL/uL (ref 3.87–5.11)
RDW: 14 % (ref 11.5–15.5)
WBC: 18.6 10*3/uL — ABNORMAL HIGH (ref 4.0–10.5)
nRBC: 0 % (ref 0.0–0.2)

## 2021-12-06 LAB — COMPREHENSIVE METABOLIC PANEL
ALT: 28 U/L (ref 0–44)
AST: 29 U/L (ref 15–41)
Albumin: 2.7 g/dL — ABNORMAL LOW (ref 3.5–5.0)
Alkaline Phosphatase: 108 U/L (ref 38–126)
Anion gap: 10 (ref 5–15)
BUN: 30 mg/dL — ABNORMAL HIGH (ref 8–23)
CO2: 38 mmol/L — ABNORMAL HIGH (ref 22–32)
Calcium: 9.2 mg/dL (ref 8.9–10.3)
Chloride: 90 mmol/L — ABNORMAL LOW (ref 98–111)
Creatinine, Ser: 0.76 mg/dL (ref 0.44–1.00)
GFR, Estimated: >60 mL/min (ref >60)
Glucose, Bld: 116 mg/dL — ABNORMAL HIGH (ref 70–99)
Potassium: 4.4 mmol/L (ref 3.5–5.1)
Sodium: 138 mmol/L (ref 135–145)
Total Bilirubin: 0.7 mg/dL (ref 0.3–1.2)
Total Protein: 6.2 g/dL — ABNORMAL LOW (ref 6.5–8.1)

## 2021-12-06 LAB — MAGNESIUM: Magnesium: 2.3 mg/dL (ref 1.7–2.4)

## 2021-12-06 LAB — PHOSPHORUS: Phosphorus: 4 mg/dL (ref 2.5–4.6)

## 2021-12-06 MED ORDER — METHYLNALTREXONE BROMIDE 12 MG/0.6ML ~~LOC~~ SOLN
12.0000 mg | Freq: Once | SUBCUTANEOUS | Status: AC
  Administered 2021-12-06: 12 mg via SUBCUTANEOUS
  Filled 2021-12-06: qty 0.6

## 2021-12-06 MED ORDER — BISACODYL 10 MG RE SUPP
10.0000 mg | Freq: Four times a day (QID) | RECTAL | Status: DC
  Administered 2021-12-06 – 2021-12-07 (×4): 10 mg via RECTAL
  Filled 2021-12-06 (×5): qty 1

## 2021-12-06 MED ORDER — SORBITOL 70 % SOLN
960.0000 mL | TOPICAL_OIL | Freq: Once | ORAL | Status: AC
  Administered 2021-12-06: 960 mL via RECTAL
  Filled 2021-12-06: qty 473

## 2021-12-06 MED ORDER — SORBITOL 70 % SOLN: 960.0000 mL | TOPICAL_OIL | Freq: Once | ORAL | Status: DC | PRN

## 2021-12-06 NOTE — Progress Notes (Signed)
Palliative Medicine Progress Note   Patient Name: Carly Abbott       Date: 12/06/2021 DOB: Nov 23, 1955  Age: 66 y.o. MRN#: [id number] Attending Physician: Albertine Patricia, MD Primary Care Physician: Dion Body, MD Admit Date: 12/06/2021   HPI/Patient Profile: 66 y.o. female   admitted on 11/23/2021 with past medical history significant for depression with anxiety,[place] multiple sclerosis, vestibular schwannoma, hypertension, and hyperlipidemia and recent admission for shortness of breath and found to have a right pleural effusion, significant for positive pathology for adenocarcinoma with suspected lung origin   Pt with recent admit also at [facility] for SOB, pleural effusion on 5/4.  Effusion drained but rapidly re-occurred and pt readmitted to [facility] 5/6.   5/7: requiring low dose morphine to control dyspnea. 5/8: Pleurx catheter placed by interventional radiology. 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  Dr. Tasia Catchings was reconsulted. 11/26/2021 CT scan of the chest showed no evidence of pulmonary embolism or thoracic dissection.  Showed a moderate to large pericardial effusion and small bilateral pleural effusions.  Infiltrate seen in both lungs and COPD seen.  Oncology consult note/Dr.Yu reflects likely overall poor prognosis, ongoing conversation regarding treatment options.   11/28/21--Echocardiogram resulted on showed a moderate pericardial effusion with normal ejection fraction and no signs of pericardial tamponade.   Cardiothoracic surgery consultation for pericardial window to reduce the amount of pericardial effusion and reduce the potential risk of pericardial tamponade.  Transferred to [facility] for pericardial  window 12/10/2021- Pericardial window and thoracotomy   Subjective: Chart reviewed and update received from bedside RN. Patient became confused overnight and pulled out the NG tube.  Prior to removal, there was 1.5 L output from the NG tube. She had 1 small bowel movement this morning.  She will receive another SMOG enema today.   Per review of MAR, patient has received a total of 24 mg IV morphine and 35 mg oxycodone in the past 24 hours.   I visited patient and her brother at bedside. She is currently calm and appears comfortable.  Her speech is unintelligible. Her brother Carly Abbott is at bedside.  Discussed plan to continue aggressive bowel regimen with goal of resolving fecal impaction. Also discussed the issue of ongoing pain/symptom management. I let Carly Abbott know I plan to continue monitoring  pain medication usage while in the hospital to help determine a regimen that will effective and realistic at home.    Objective:  Physical Exam Vitals reviewed.  Constitutional:      General: She is not in acute distress.    Appearance: She is cachectic. She is ill-appearing.  Pulmonary:     Effort: Pulmonary effort is normal.  Neurological:     Mental Status: She is alert. She is confused.     Motor: Weakness present.            Vital Signs: BP 106/69   Pulse 99   Temp (!) 97.5 F (36.4 C) (Oral)   Resp 12   Ht 4\' 10"  (1.473 m)   Wt 42.5 kg   SpO2 99%   BMI 19.58 kg/m  SpO2: SpO2: 99 % O2 Device: O2 Device: Nasal Cannula O2 Flow Rate: O2 Flow Rate (L/min): 6 L/min    Palliative Assessment/Data: PPS 20%     Palliative Medicine Assessment & Plan   Assessment: Principal Problem:   Pericardial effusion Active Problems:   Anxiety and depression   Pleural effusion on right   SIRS (systemic inflammatory response syndrome) (HCC)   Hypotension   Protein-calorie malnutrition, severe   Metastatic adenocarcinoma (HCC)   Acute respiratory failure with hypoxia (HCC)   Constipation     Recommendations/Plan: DNR/DNI as previously documented Continue all current interventions Plan for home with hospice - [facility] Continue aggressive bowel regimen -need to resolve fecal impaction prior to discharge PMT will continue to follow   Prognosis Very poor, possibly 2 to 4 weeks in the setting of minimal to no oral intake    Thank you for allowing the Palliative Medicine Team to assist in the care of this patient.   MDM - [place]   Carly Bullion, NP   Please contact Palliative Medicine Team phone at [phone] for questions and concerns.  For individual providers, please see AMION.

## 2021-12-06 NOTE — Progress Notes (Signed)
[facility] PROGRESS NOTE   ABRI VACCA GYF:[id number] DOB: 09-09-1955 DOA: 12/01/2021  PCP: Dion Body, MD  Brief History/Interval Summary:   66 y.o. female with medical history significant of depression, anxiety, MS, HTN.   Pt with recent admit also at [facility] for SOB, pleural effusion on 5/4.  Effusion drained but rapidly re-occurred and pt readmitted to [facility] 5/6.   Summary of stay at [facility]: - 5/8: Pleurx catheter placed by interventional radiology, pleural fluid came back metastatic adenocarcinoma with suspected lung origin.  oncology Dr. Tasia Catchings was reconsulted. - 11/26/2021 CT chest/2D echo confirmed significant pericardial effusion , patient transferred to [facility] for evaluation by CT surgery for pericardial window.  .  Significant events at [facility] - 5/16 status post TEE with pericardial window with thoracotomy approach -5/18, left side chest tube discontinued - 5/19, patient developed nausea and vomiting overnight, chest tube inserted due to concerns of bowel obstruction from severe constipation   Consultants:  Medical oncology at [facility]: Dr. Tasia Catchings.   Interventional radiology at [facility]  Cardiology at [facility]: Dr. Nehemiah Massed. Cardiothoracic surgery here at [place]   Procedures:  - Pleurx catheter insertion by IR on 5/8 at [facility] - Dr. Kipp Brood 12/01/2021 TRANSESOPHAGEAL ECHOCARDIOGRAM (TEE) (N/A) PERICARDIAL WINDOW WITH THORACOTOMY APPROACH (Right)  Subjective/Interval History:  Pain is better controlled with current regimen, remains with significant constipation, she slipped from bed to chair while she was trying to go to the bedside commode.  Assessment/Plan:  Pericardial effusion - Echo shows large pericardial effusion but no tamponade physiology . Pt transferred from [facility] to [facility] for pericardial window.  - Effusion most likely malignant in etiology. -CT surgery input greatly appreciated, status post TEE and pericardial window with  thoracotomy approach by Dr. Kipp Brood 5/16, postoperative management per CT surgery, continue continue with as needed pain regimen. -Chest tube discontinued 5/18 . -Leukocytosis trending down, leukocytosis most likely due to steroids, procalcitonin is reassuring.    Acute respiratory failure with hypoxia (HCC) -She remains on 4 to 6 L nasal cannula -Respiratory status very tenuous, she was encouraged to use incentive spirometer.   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  Likely lung origin.  As discussed with Dr.Yu from [facility], as it is TTF+, she is a lung markers, and favors lung origin.  - Will need outpatient follow-up with him after discharge. Per last note by Dr. Tasia Catchings, prognosis is thought to be poor.  Waiting on further studies to see if she will be a candidate for immunotherapy.  Patient told him that she was leaning towards no chemotherapy.  Could be a candidate for hospice going forward but will depend on results of further test that were ordered. Started on cough suppressants.  Patient's status noted to be tenuous.   -Palliative medicine assistance greatly appreciated, currently plan is for home with hospice.  AKI -Resolved with IV fluids and midodrine  Hyperkalemia -Resolved with Lokelma  Severe constipation/large bowel obstruction due to severe constipation/nausea/vomiting - Acute constipation noted on CT. started on aggressive bowel regimen.  There was some question of gastric outlet obstruction but patient without any symptoms.  -She remains with no bowel movement despite aggressive bowel regimen, she is on MiraLAX, milk of magnesium, and received as needed suppositories, despite that remains with no BM, so add lactulose has been added, and Dulcolax suppository.  . -She is on Movantik as well . -CT abdomen and pelvis with large stool burden in the cecum,  with decompression of transverse and more distal colon, concerning for  constipating lesions within the transverse colon near the hepatic flexure, patient had colonoscopy in 2017 with no acute findings, her findings most likely related to severe constipation. -G-tube inserted yesterday, with significant amount of output, it was dislodged today . -Discussed with brother, if she having recurrent nausea and vomiting then will reinsert NG tube . -Continue with good bowel regimen, she is on multiple laxatives including milk of magnesia, lactulose, MiraLAX, Dulcolax, as well received a smog enema, and on scheduled suppositories as well, .  Hypotension Pt had tachycardia and hypotension during stay at [facility], started on midodrine.  Blood pressures have improved.  Blood pressure noted to be in the hypertensive range now.  Cut back on midodrine.   Community-acquired pneumonia CT at [facility] showing PNA.  Was placed on ceftriaxone and azithromycin.  Completed course of antibiotics.    Pleural effusion on right Patient underwent thoracentesis on 5/5 with 1.2 L fluid removed.  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  IR placed a Pleurx drainage catheter on 5/8. Experiencing significant pain and discomfort.  Was started on OxyContin 10 mg twice a day and oxycodone as needed.  Continue cough suppressants.  May need to increase the dose of the narcotics.  Consider doing so after her procedure. Drainage per the Plurex order set.    Subclinical hypothyroidism TSH noted to be elevated at 9.7.  Patient mentions that she has a longstanding history of subclinical hypothyroidism.  Free T4 noted to be 0.94.  No further work-up at this time.  Abnormal LFTs Mildly elevated AST and ALT level noted.  Only done CT scan did not show any liver lesions.   -Trending down  Protein-calorie malnutrition, severe In setting of metastatic CA   Anxiety and depression Continue zoloft and valium.  History of multiple sclerosis Stable.  Uses Flexeril at home.  Goals of care She is DNR, goal is  home with hospice, but currently need to address the issue of severe constipation, and a huge stool burden on imaging.  She is stable for discharge, especially she is thematic with nausea and vomiting      DVT Prophylaxis: Lovenox Code Status: DNR.  DNR  Family Communication: Brother at bedside Disposition Plan: Home with hospice  Status is: Inpatient      Medications: Scheduled:  bisacodyl  10 mg Rectal QID   Chlorhexidine Gluconate Cloth  6 each Topical Daily   cyclobenzaprine  10 mg Per Tube BID   enoxaparin (LOVENOX) injection  30 mg Subcutaneous Q24H   feeding supplement  237 mL Per Tube TID BM   lactulose  30 g Per Tube TID   magnesium hydroxide  30 mL Per Tube BID   methocarbamol  500 mg Per Tube Q8H   midodrine  5 mg Per Tube TID WC   oxybutynin  5 mg Per Tube BID   oxyCODONE  5 mg Per Tube Q6H   polyethylene glycol  17 g Per Tube BID   senna-docusate  2 tablet Per Tube BID   sertraline  50 mg Per Tube Daily   Continuous:  promethazine (PHENERGAN) injection (IM or IVPB)      IZT:IWPYKDXIPJASN **OR** acetaminophen, chlorpheniramine-HYDROcodone, diazepam, lip balm, morphine (PF), ondansetron **OR** ondansetron (ZOFRAN) IV, oxyCODONE, promethazine (PHENERGAN) injection (IM or IVPB), sodium phosphate, sorbitol, milk of mag, mineral oil, glycerin (SMOG) enema  Antibiotics: Anti-infectives (From admission, onward)    Start     Dose/Rate Route Frequency Ordered Stop  12/01/21 0801  ceFAZolin (ANCEF) IVPB 2g/100 mL premix        2 g 200 mL/hr over 30 Minutes Intravenous 30 min pre-op 12/01/21 0801 12/15/2021 1337   11/19/2021 1000  azithromycin (ZITHROMAX) tablet 250 mg        250 mg Oral Daily 12/03/2021 0122 12/01/21 1049   12/06/2021 1000  cefTRIAXone (ROCEPHIN) 2 g in sodium chloride 0.9 % 100 mL IVPB        2 g 200 mL/hr over 30 Minutes Intravenous Every 24 hours 11/19/2021 0122 12/01/21 1130       Objective:  Vital Signs  Vitals:   12/06/21 0800 12/06/21 1000  12/06/21 1100 12/06/21 1217  BP:  106/69 (!) 112/59   Pulse:   100 (!) 101  Resp:  12 14 15   Temp:    97.9 F (36.6 C)  TempSrc:    Oral  SpO2: 97%   100%  Weight:      Height:        Intake/Output Summary (Last 24 hours) at 12/06/2021 1303 Last data filed at 12/06/2021 0800 Gross per 24 hour  Intake 440 ml  Output 1475 ml  Net -1035 ml   Filed Weights   12/11/2021 0400 12/03/21 0359 12/04/21 0600  Weight: 41.2 kg 42.8 kg 42.5 kg    Awake, extremely frail, cachectic, deconditioned, chronically ill-appearing Symmetrical Chest wall movement, managed air entry at the bases, coarse respiratory sound in the right lung RRR,No Gallops,Rubs or new Murmurs, No Parasternal Heave +ve B.Sounds, minimally distended, no rebound, no guarding. No Cyanosis, Clubbing or edema, No new Rash or bruise       Lab Results:  Data Reviewed: I have personally reviewed following labs and reports of the imaging studies  CBC: Recent Labs  Lab 12/01/21 0422 11/18/2021 1545 12/03/21 0435 12/04/21 0409 12/05/21 0435 12/06/21 0409  WBC 17.2*  --  20.2* 25.6* 18.6* 18.6*  HGB 14.6 14.3 13.5 14.7 13.3 13.4  HCT 46.0 42.0 42.9 46.7* 41.7 42.2  MCV 92.9  --  94.9 94.3 93.7 94.0  PLT 625*  --  559* 672* 552* 584*    Basic Metabolic Panel: Recent Labs  Lab 12/01/21 0422 12/17/2021 1545 12/03/21 0435 12/04/21 0409 12/04/21 2300 12/05/21 0435 12/06/21 0409  NA 137 135 134* 136  --  135 138  K 5.1 4.4 5.1 5.7* 4.6 4.5 4.4  CL 94*  --  99 94*  --  92* 90*  CO2 33*  --  26 30  --  33* 38*  GLUCOSE 95  --  125* 152*  --  134* 116*  BUN 28*  --  24* 34*  --  31* 30*  CREATININE 0.73  --  0.59 1.13*  --  0.73 0.76  CALCIUM 9.4  --  8.6* 9.3  --  8.7* 9.2  MG  --   --   --   --   --   --  2.3  PHOS  --   --   --   --   --   --  4.0    GFR: Estimated Creatinine Clearance: 44.1 mL/min (by C-G formula based on SCr of 0.76 mg/dL).    Recent Results (from the past 240 hour(s))  Surgical pcr screen      Status: None   Collection Time: 11/29/2021  8:08 AM   Specimen: Nasal Mucosa; Nasal Swab  Result Value Ref Range Status   MRSA, PCR NEGATIVE NEGATIVE Final   Staphylococcus aureus NEGATIVE  NEGATIVE Final    Comment: (NOTE) The Xpert SA Assay (FDA approved for NASAL specimens in patients 47 years of age and older), is one component of a comprehensive surveillance program. It is not intended to diagnose infection nor to guide or monitor treatment. Performed at [facility], [place] [address], [place], [place] [address]        Radiology Studies: DG Abd 1 View  Result Date: 12/06/2021 CLINICAL DATA:  Nasogastric tube placement EXAM: ABDOMEN - 1 VIEW COMPARISON:  12/05/2021 FINDINGS: No gastric tube extends into the left mid abdomen beyond the margin of the examination. Mild bibasilar atelectasis. Tiny right pleural effusion. Right tunneled pleural drainage catheter is in place. Central venous catheter tip noted within the right atrium. Multiple gas-filled prominent loops of bowel are again seen within the upper abdomen suggestive of a a underlying ileus or distal small bowel obstruction. Pelvis excluded from view. No gross free intraperitoneal gas. IMPRESSION: Nasogastric tube tip extends into the mid abdomen, beyond the margin of the examination extending to at least the mid body of the stomach. Stable ileus versus distal small bowel obstruction. Right tunneled pleural drainage catheter in place. Small residual right pleural effusion. Electronically Signed   By: Fidela Salisbury M.D.   On: 12/06/2021 00:24   DG Chest Port 1 View  Result Date: 12/05/2021 CLINICAL DATA:  Line placement. EXAM: PORTABLE CHEST 1 VIEW COMPARISON:  Dec 03, 2021. FINDINGS: Nasogastric tube tip is seen in proximal stomach. Right internal jugular catheter is again noted with distal tip in expected position of cavoatrial junction right-sided chest tube is noted without pneumothorax. IMPRESSION: Nasogastric tube tip  seen in proximal stomach. Stable position of right internal jugular catheter. Electronically Signed   By: Marijo Conception M.D.   On: 12/05/2021 12:58   DG Abd Portable 1V  Result Date: 12/05/2021 CLINICAL DATA:  Nausea and vomiting. EXAM: PORTABLE ABDOMEN - 1 VIEW COMPARISON:  CT 11/28/2021 FINDINGS: Examination demonstrates mild amount of residual contrast within the colon from patient's previous CT scan. There is significant fecal retention over the rectum unchanged. Several air-filled loops of bowel over the right upper quadrant without significant change. Some which may be minimally prominent small bowel loops. No definite free peritoneal air. Surgical clips over the right upper quadrant. Mild degenerate change of the spine and hips. IMPRESSION: Nonspecific, nonobstructive bowel gas pattern. Significant fecal retention over the rectum unchanged. Electronically Signed   By: Marin Olp M.D.   On: 12/05/2021 09:11       LOS: 6 days   Damarys Speir  [facility] Pager on [email or web address]  12/06/2021, 1:03 PM

## 2021-12-06 NOTE — Progress Notes (Signed)
Pt has PRN BIPAP orders, no distress noted at this time.  °

## 2021-12-06 NOTE — Progress Notes (Signed)
Manufacturing [employer] [facility]) Hospital Liaison Note  [facility] will continue to follow for coordination of hospice at discharge. Please call with any questions or concerns. Thank you  Roselee Nova, [place] [facility] Liaison  [phone]

## 2021-12-06 NOTE — Progress Notes (Signed)
Inserted NGT additional 10 cm per Xray determinant- increased gastric content removed- total volume since insertion- 420 ml thick green fluid-

## 2021-12-07 DIAGNOSIS — J9601 Acute respiratory failure with hypoxia: Secondary | ICD-10-CM | POA: Diagnosis not present

## 2021-12-07 DIAGNOSIS — I3139 Other pericardial effusion (noninflammatory): Secondary | ICD-10-CM | POA: Diagnosis not present

## 2021-12-07 DIAGNOSIS — K5900 Constipation, unspecified: Secondary | ICD-10-CM | POA: Diagnosis not present

## 2021-12-07 DIAGNOSIS — J90 Pleural effusion, not elsewhere classified: Secondary | ICD-10-CM | POA: Diagnosis not present

## 2021-12-07 DIAGNOSIS — Z515 Encounter for palliative care: Secondary | ICD-10-CM | POA: Diagnosis not present

## 2021-12-07 DIAGNOSIS — C799 Secondary malignant neoplasm of unspecified site: Secondary | ICD-10-CM | POA: Diagnosis not present

## 2021-12-07 LAB — COMPREHENSIVE METABOLIC PANEL
ALT: 26 U/L (ref 0–44)
AST: 30 U/L (ref 15–41)
Albumin: 2.8 g/dL — ABNORMAL LOW (ref 3.5–5.0)
Alkaline Phosphatase: 118 U/L (ref 38–126)
Anion gap: 15 (ref 5–15)
BUN: 29 mg/dL — ABNORMAL HIGH (ref 8–23)
CO2: 38 mmol/L — ABNORMAL HIGH (ref 22–32)
Calcium: 8.9 mg/dL (ref 8.9–10.3)
Chloride: 87 mmol/L — ABNORMAL LOW (ref 98–111)
Creatinine, Ser: 0.77 mg/dL (ref 0.44–1.00)
GFR, Estimated: >60 mL/min (ref >60)
Glucose, Bld: 124 mg/dL — ABNORMAL HIGH (ref 70–99)
Potassium: 3.7 mmol/L (ref 3.5–5.1)
Sodium: 140 mmol/L (ref 135–145)
Total Bilirubin: 0.6 mg/dL (ref 0.3–1.2)
Total Protein: 6.5 g/dL (ref 6.5–8.1)

## 2021-12-07 LAB — CBC
HCT: 43.8 % (ref 36.0–46.0)
Hemoglobin: 14.1 g/dL (ref 12.0–15.0)
MCH: 30.1 pg (ref 26.0–34.0)
MCHC: 32.2 g/dL (ref 30.0–36.0)
MCV: 93.4 fL (ref 80.0–100.0)
Platelets: 523 10*3/uL — ABNORMAL HIGH (ref 150–400)
RBC: 4.69 MIL/uL (ref 3.87–5.11)
RDW: 14.1 % (ref 11.5–15.5)
WBC: 19.3 10*3/uL — ABNORMAL HIGH (ref 4.0–10.5)
nRBC: 0 % (ref 0.0–0.2)

## 2021-12-07 LAB — MAGNESIUM: Magnesium: 2.3 mg/dL (ref 1.7–2.4)

## 2021-12-07 LAB — PHOSPHORUS: Phosphorus: 4 mg/dL (ref 2.5–4.6)

## 2021-12-07 MED ORDER — BIOTENE DRY MOUTH MT LIQD: 15.0000 mL | OROMUCOSAL | Status: DC | PRN

## 2021-12-07 MED ORDER — SODIUM CHLORIDE 0.9% FLUSH: 10.0000 mL | INTRAVENOUS | Status: DC | PRN

## 2021-12-07 MED ORDER — GLYCOPYRROLATE 0.2 MG/ML IJ SOLN: 0.2000 mg | INTRAMUSCULAR | Status: DC | PRN

## 2021-12-07 MED ORDER — POLYVINYL ALCOHOL 1.4 % OP SOLN
1.0000 [drp] | Freq: Four times a day (QID) | OPHTHALMIC | Status: DC | PRN
  Filled 2021-12-07: qty 15

## 2021-12-07 MED ORDER — MORPHINE BOLUS VIA INFUSION
1.0000 mg | INTRAVENOUS | Status: DC | PRN
  Administered 2021-12-07 (×4): 2 mg via INTRAVENOUS

## 2021-12-07 MED ORDER — MORPHINE 100MG IN NS 100ML (1MG/ML) PREMIX INFUSION: 2.0000 mg/h | INTRAVENOUS | 0 refills | Status: DC

## 2021-12-07 MED ORDER — GLYCOPYRROLATE 1 MG PO TABS
1.0000 mg | ORAL_TABLET | ORAL | Status: DC | PRN
Start: 2021-12-07 — End: 2021-12-09

## 2021-12-07 MED ORDER — LORAZEPAM 2 MG/ML IJ SOLN
1.0000 mg | INTRAMUSCULAR | Status: DC | PRN
  Administered 2021-12-08 (×2): 2 mg via INTRAVENOUS
  Filled 2021-12-07 (×2): qty 1

## 2021-12-07 MED ORDER — MORPHINE 100MG IN NS 100ML (1MG/ML) PREMIX INFUSION
2.0000 mg/h | INTRAVENOUS | Status: DC
  Administered 2021-12-07: 1 mg/h via INTRAVENOUS
  Filled 2021-12-07: qty 100

## 2021-12-07 NOTE — TOC Progression Note (Addendum)
Transition of Care [facility]) - Progression Note    Patient Details  Name: Carly Abbott MRN: [id number] Date of Birth: 06/22/1956  [facility] CM/SW Contact  Carles Collet, RN Phone Number: 12/07/2021, 2:27 PM  Clinical Narrative:     Patient will DC tomorrow with home hospice services through [facility] per [name], [facility] liaison. She will need a dose of morphine just prior to transport and [facility] will initiate morphine at home. [facility] is ordering and obtaining morphine, no CM actions needed.  DME is scheduled to be delivered to home between 10 and 12noon.  On day of DC Use [facility] for transport per [facility] liaison. Forms are on chart. Give dose of morphine prior to transport. Notify [facility] with estimated time of arrival  at home for patient so that [facility] RN can meet her at home to set up morphine pump.   Verified with patient's brother that she will be returning to address on file, and he will be staying with her to give supportive care  Joliy[place], [place]     [phone]         Expected Discharge Plan: Home w Hospice Care Barriers to Discharge: Continued Medical Work up  Expected Discharge Plan and Services Expected Discharge Plan: [place]   Discharge Planning Services: CM Consult Post Acute Care Choice: Hospice Living arrangements for the past 2 months: Single Family Home                 DME Arranged: N/A         HH Arranged: RN HH Agency: [facility] [place] [facility] Date HH Agency Contacted: 12/05/21 Time HH Agency Contacted: 1330 Representative spoke with at [place]: [place] Determinants of Health ([place]) Interventions    Readmission Risk Interventions     View : No data to display.

## 2021-12-07 NOTE — Progress Notes (Addendum)
[facility] ([facility])  Plans to d/c home with hospice support on Monday.  Patient will need an ambulance to take her home.  DME to be delivered: bed, O2, suction, will be delivered between 10-12 on Monday.  She will need continuous IV medications at discharge and will require careful planning to ensure symptoms are met and her desire to pass at home is met.   PMT is assisting with arranging comfort meds at discharge.  Thank you for allowing [place] to care for Carly Abbott.  Carly Carbon DNP, RN [facility] Liaison

## 2021-12-07 NOTE — Progress Notes (Signed)
Palliative Medicine Progress Note   Patient Name: Carly Abbott       Date: 12/07/2021 DOB: Feb 09, 1956  Age: 66 y.o. MRN#: [id number] Attending Physician: Albertine Patricia, MD Primary Care Physician: Dion Body, MD Admit Date: 12/03/2021   HPI/Patient Profile: 65 y.o. female with past medical history of depression, anxiety, multiple sclerosis, hypertension, and hyperlipidemia.  She initially presented to [facility] on 11/20/21 with shortness of breath and was found to have a right pleural effusion.  Effusion drained but rapidly re-occurred and pt readmitted to [facility] 5/6.  Summary of stay at [facility]:  5/4:    CXR showed right middle lobe PNA with associated right sided effusion 5/5:    Patient underwent thoracentesis and 1.2 L removed 5/8:    Pleurx catheter placed by interventional radiology 5/10:    -Pleural fluid cytology came back positive for malignancy, metastatic adenocarcinoma, features consistent with primary lung -Oncology consult note/Dr.Yu reflects likely overall poor prognosis, discussed option of systemic immunotherapy +/- chemotherapy             - CT chest showed moderate to large pericardial effusion and small bilateral pleural effusions 5/12:   2D echo confirmed moderately sized pericardial effusion, but showed no evidence of cardiac tamponade  Summary of stay at [facility]: 12/10/2021   Patient transferred to [facility] for evaluation by CT surgery for pericardial window 12/15/2021   Pericardial window and thoracotomy  12/05/21   patient developed nausea and vomiting overnight, NG tube inserted due to concerns of obstruction from severe constipation   Subjective: Chart reviewed.  Note that NG tube was replaced overnight with increased gastric content removed.  Note that she has not had a bowel  movement despite multiple laxatives, suppositories, enemas, and attempt at manual disimpaction.  I went to see patient at bedside.  She appears uncomfortable and tells me that her pain is a 7/10.  I spoke with her brother Wynonia Lawman outside the room.    Discussed that despite aggressive attempts, patient's significant fecal impaction has not resolved.  Wynonia Lawman expresses that he does not want Carly Abbott to have to endure additional interventions for her bowels.  He is ready to shift to comfort measures only.  Discussed transitioning to comfort care while in the hospital, and what that would look like--keeping her clean and dry, no labs, no artificial  hydration or feeding, no antibiotics, minimizing of medications, comfort feeds, and medication for pain and dyspnea. Discussed that morphine infusion will be started as previously ordered by Dr. Waldron Labs.   Clyde fully recognizes that Carly Abbott is dying and her prognosis is likely short.  However, he wants to honor her wishes to go home even if it is only for a short time.  I let him know that I would work in coordination with TOC and the hospice liaison to facilitate this process.  15:00 - I returned to bedside to assess patient.  Morphine infusion is at 1 mg/hr. Carly Abbott is able to express she is uncomfortable.  I administered a bolus dose of 2 mg via the pump and increased rate to 2 mg/hr.   Additional time spent in coordination of care and discussion of care plan with Dr. Wald[facility] Labs, bedside RN, [facility], and [facility] hospice liaison [name], RN.     Objective:  Physical Exam Constitutional:      General: She is not in acute distress.    Appearance: She is ill-appearing.  Pulmonary:     Effort: Pulmonary effort is normal.  Neurological:     Mental Status: She is confused.     Motor: Weakness present.            Vital Signs: BP 104/62 (BP Location: Right Arm)   Pulse 96   Temp 98.2 F (36.8 C) (Axillary)   Resp 14   Ht 4\' 10"  (1.473 m)   Wt 42.5 kg   SpO2 100%    BMI 19.58 kg/m  SpO2: SpO2: 100 % O2 Device: O2 Device: Nasal Cannula O2 Flow Rate: O2 Flow Rate (L/min): 6 L/min    LBM: Last BM Date :  (PTA)     Palliative Assessment/Data: PPS 20%     Palliative Medicine Assessment & Plan   Assessment: Principal Problem:   Pericardial effusion Active Problems:   Anxiety and depression   Pleural effusion on right   SIRS (systemic inflammatory response syndrome) (HCC)   Hypotension   Protein-calorie malnutrition, severe   Metastatic adenocarcinoma (HCC)   Acute respiratory failure with hypoxia (HCC)   Constipation    Recommendations/Plan: Full comfort measures initiated DNR/DNI as previously documented Morphine infusion to reduce symptom burden - Rx will be sent to continue this at home Added orders for symptom management at EOL Discontinued orders that were not focused on comfort Plan for home with hospice tomorrow Plan to clamp NG tube tomorrow morning - if patient tolerates with no nausea/vomiting will remove prior to discharge PMT will follow-up tomorrow   Prognosis:  < 2 weeks  Discharge Planning: Home with Hospice    Thank you for allowing the Palliative Medicine Team to assist in the care of this patient.   Greater than 50%  of this time was spent counseling and coordinating care related to the above assessment and plan.  Total time: 70 minutes   Lavena Bullion, NP Palliative Medicine   Please contact Palliative Medicine Team phone at [phone] for questions and concerns.  For individual provider, see AMION.

## 2021-12-07 NOTE — Progress Notes (Addendum)
[facility] PROGRESS NOTE   Carly Abbott HWE:[id number] DOB: 1956-04-19 DOA: 11/26/2021  PCP: Dion Body, MD  Brief History/Interval Summary:   66 y.o. female with medical history significant of depression, anxiety, MS, HTN.   Pt with recent admit also at [facility] for SOB, pleural effusion on 5/4.  Effusion drained but rapidly re-occurred and pt readmitted to [facility] 5/6.   Summary of stay at [facility]: - 5/8: Pleurx catheter placed by interventional radiology, pleural fluid came back metastatic adenocarcinoma with suspected lung origin.  oncology Dr. Tasia Catchings was reconsulted. - 11/26/2021 CT chest/2D echo confirmed significant pericardial effusion , patient transferred to [facility] for evaluation by CT surgery for pericardial window.  .  Significant events at [facility] - 5/16 status post TEE with pericardial window with thoracotomy approach -5/18, left side chest tube discontinued - 5/19, patient developed nausea and vomiting overnight, chest tube inserted due to concerns of bowel obstruction from severe constipation   Consultants:  Medical oncology at [facility]: Dr. Tasia Catchings.   Interventional radiology at [facility]  Cardiology at [facility]: Dr. Nehemiah Massed. Cardiothoracic surgery here at [place]   Procedures:  - Pleurx catheter insertion by IR on 5/8 at [facility] - Dr. Kipp Brood 12/13/2021 TRANSESOPHAGEAL ECHOCARDIOGRAM (TEE) (N/A) PERICARDIAL WINDOW WITH THORACOTOMY APPROACH (Right)  Subjective/Interval History:  Patient was started yesterday on morphine drip given significant amount of discomfort, unfortunately she remains with no bowel movements, NG tube remains in place  Assessment/Plan:     Pericardial effusion - Echo shows large pericardial effusion but no tamponade physiology . Pt transferred from [facility] to [facility] for pericardial window.  - Effusion most likely malignant in etiology. -CT surgery input greatly appreciated, status post TEE and pericardial window  with thoracotomy approach by Dr. Kipp Brood 5/16, postoperative management per CT surgery, continue continue with as needed pain regimen. -Chest tube discontinued 5/18 . -Leukocytosis trending down, leukocytosis most likely due to steroids, procalcitonin is reassuring.    Acute respiratory failure with hypoxia (HCC) -She remains on 4 to 6 L nasal cannula -Respiratory status very tenuous, she was encouraged to use incentive spirometer.   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  Likely lung origin.  As discussed with Dr.Yu from [facility], as it is TTF+, she is a lung markers, and favors lung origin.  - Will need outpatient follow-up with him after discharge. Per last note by Dr. Tasia Catchings, prognosis is thought to be poor.  Waiting on further studies to see if she will be a candidate for immunotherapy.  Patient told him that she was leaning towards no chemotherapy.  Could be a candidate for hospice going forward but will depend on results of further test that were ordered. Started on cough suppressants.  Patient's status noted to be tenuous.   -Palliative medicine assistance greatly appreciated, initial plan was for home with hospice, but currently hospital death is expected  AKI -Resolved with IV fluids and midodrine  Hyperkalemia -Resolved with Lokelma  Severe constipation/large bowel obstruction due to severe constipation/nausea/vomiting - Acute constipation noted on CT. started on aggressive bowel regimen.  There was some question of gastric outlet obstruction but patient without any symptoms.  -She remains with no bowel movement despite aggressive bowel regimen, she is on MiraLAX, milk of magnesium, and received as needed suppositories, despite that remains with no BM, so add lactulose has been added, and Dulcolax suppository.  . -She is on Movantik as well . -CT abdomen and pelvis with  large stool burden in the cecum, with decompression of transverse and  more distal colon, concerning for constipating lesions within the transverse colon near the hepatic flexure, patient had colonoscopy in 2017 with no acute findings, her findings most likely related to severe constipation. -G-tube inserted yesterday, with significant amount of output, it was dislodged today . -NG tube was reinserted yesterday given significant symptoms including nausea, vomiting. -Continue with good bowel regimen, she is on multiple laxatives including milk of magnesia, lactulose, MiraLAX, Dulcolax, as well received a smog enema, and on scheduled suppositories as well, . -She has received Movantik and methylnaltrexone -Manual disimpaction unsuccessful on 5/20, was attempted by RN, due to to the stool being high and unreachable in the rectal vault.  Hypotension Pt had tachycardia and hypotension during stay at [facility], started on midodrine.  Blood pressures have improved.  Blood pressure noted to be in the hypertensive range now.  Cut back on midodrine.   Community-acquired pneumonia CT at [facility] showing PNA.  Was placed on ceftriaxone and azithromycin.  Completed course of antibiotics.    Pleural effusion on right Patient underwent thoracentesis on 5/5 with 1.2 L fluid removed.  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  IR placed a Pleurx drainage catheter on 5/8. Experiencing significant pain and discomfort.  Was started on OxyContin 10 mg twice a day and oxycodone as needed.  Continue cough suppressants.  May need to increase the dose of the narcotics.  Consider doing so after her procedure. Drainage per the Plurex order set.    Subclinical hypothyroidism TSH noted to be elevated at 9.7.  Patient mentions that she has a longstanding history of subclinical hypothyroidism.  Free T4 noted to be 0.94.  No further work-up at this time.  Abnormal LFTs Mildly elevated AST and ALT level noted.  Only done CT scan did not show any liver lesions.   -Trending down  Protein-calorie  malnutrition, severe In setting of metastatic CA   Anxiety and depression Continue zoloft and valium.  History of multiple sclerosis Stable.  Uses Flexeril at home.         DVT Prophylaxis: Lovenox Code Status: DNR.  DNR  Family Communication: Brother at bedside Disposition Plan: Home with hospice  Status is: Inpatient      Medications: Scheduled:  bisacodyl  10 mg Rectal QID   Chlorhexidine Gluconate Cloth  6 each Topical Daily   cyclobenzaprine  10 mg Per Tube BID   enoxaparin (LOVENOX) injection  30 mg Subcutaneous Q24H   magnesium hydroxide  30 mL Per Tube BID   methocarbamol  500 mg Per Tube Q8H   midodrine  5 mg Per Tube TID WC   polyethylene glycol  17 g Per Tube BID   senna-docusate  2 tablet Per Tube BID   sertraline  50 mg Per Tube Daily   Continuous:  morphine 1 mg/hr (12/07/21 1108)   promethazine (PHENERGAN) injection (IM or IVPB)      AQT:[id number] **OR** acetaminophen, chlorpheniramine-HYDROcodone, diazepam, lip balm, morphine, ondansetron **OR** ondansetron (ZOFRAN) IV, oxyCODONE, promethazine (PHENERGAN) injection (IM or IVPB), sodium chloride flush, sodium phosphate, sorbitol, milk of mag, mineral oil, glycerin (SMOG) enema  Antibiotics: Anti-infectives (From admission, onward)    Start     Dose/Rate Route Frequency Ordered Stop   12/01/21 0801  ceFAZolin (ANCEF) IVPB 2g/100 mL premix        2 g 200 mL/hr over 30 Minutes Intravenous 30 min pre-op 12/01/21 0801 11/18/2021 1337   11/19/2021 1000  azithromycin (ZITHROMAX) tablet  250 mg        250 mg Oral Daily 12/13/2021 0122 12/01/21 1049   11/29/2021 1000  cefTRIAXone (ROCEPHIN) 2 g in sodium chloride 0.9 % 100 mL IVPB        2 g 200 mL/hr over 30 Minutes Intravenous Every 24 hours 11/29/2021 0122 12/01/21 1130       Objective:  Vital Signs  Vitals:   12/06/21 2313 12/07/21 0206 12/07/21 0326 12/07/21 0740  BP: 111/76 (!) 110/58 102/65 104/62  Pulse: 96 97 96   Resp: 12 17 12 14   Temp:  98.5 F (36.9 C) 98.4 F (36.9 C) 98.2 F (36.8 C)   TempSrc: Axillary Oral Axillary   SpO2: 98% 100% 100%   Weight:      Height:        Intake/Output Summary (Last 24 hours) at 12/07/2021 1505 Last data filed at 12/07/2021 0206 Gross per 24 hour  Intake --  Output 1800 ml  Net -1800 ml   Filed Weights   11/29/2021 0400 12/03/21 0359 12/04/21 0600  Weight: 41.2 kg 42.8 kg 42.5 kg    Awake, extremely frail, cachectic, uncomfortable Symmetrical Chest wall movement, Good air movement bilaterally, CTAB RRR,No Gallops,Rubs or new Murmurs, No Parasternal Heave Abdomen mildly distended No Cyanosis, Clubbing or edema, No new Rash or bruise         Lab Results:  Data Reviewed: I have personally reviewed following labs and reports of the imaging studies  CBC: Recent Labs  Lab 12/03/21 0435 12/04/21 0409 12/05/21 0435 12/06/21 0409 12/07/21 0143  WBC 20.2* 25.6* 18.6* 18.6* 19.3*  HGB 13.5 14.7 13.3 13.4 14.1  HCT 42.9 46.7* 41.7 42.2 43.8  MCV 94.9 94.3 93.7 94.0 93.4  PLT 559* 672* 552* 584* 523*    Basic Metabolic Panel: Recent Labs  Lab 12/03/21 0435 12/04/21 0409 12/04/21 2300 12/05/21 0435 12/06/21 0409 12/07/21 0143  NA 134* 136  --  135 138 140  K 5.1 5.7* 4.6 4.5 4.4 3.7  CL 99 94*  --  92* 90* 87*  CO2 26 30  --  33* 38* 38*  GLUCOSE 125* 152*  --  134* 116* 124*  BUN 24* 34*  --  31* 30* 29*  CREATININE 0.59 1.13*  --  0.73 0.76 0.77  CALCIUM 8.6* 9.3  --  8.7* 9.2 8.9  MG  --   --   --   --  2.3 2.3  PHOS  --   --   --   --  4.0 4.0    GFR: Estimated Creatinine Clearance: 44.1 mL/min (by C-G formula based on SCr of 0.77 mg/dL).    Recent Results (from the past 240 hour(s))  Surgical pcr screen     Status: None   Collection Time: 12/13/2021  8:08 AM   Specimen: Nasal Mucosa; Nasal Swab  Result Value Ref Range Status   MRSA, PCR NEGATIVE NEGATIVE Final   Staphylococcus aureus NEGATIVE NEGATIVE Final    Comment: (NOTE) The Xpert SA Assay  (FDA approved for NASAL specimens in patients 5 years of age and older), is one component of a comprehensive surveillance program. It is not intended to diagnose infection nor to guide or monitor treatment. Performed at [facility], [place] [address], [place], [place] [address]        Radiology Studies: DG Abd 1 View  Result Date: 12/06/2021 CLINICAL DATA:  NG placement. EXAM: ABDOMEN - 1 VIEW COMPARISON:  Earlier radiograph dated 12/06/2021. FINDINGS: Enteric  tube with side-port above the GE junction and tip just distal to the GE junction. Recommend further advancing of the tube by additional 10 cm. Dilated loops of bowel in the upper abdomen. Air in the right upper abdomen under the right hemidiaphragm appears similar to prior radiograph and may be intraluminal. However, the possibility of pneumoperitoneum is not excluded CT may provide better evaluation if clinically indicated. Additional support lines in similar position. IMPRESSION: Enteric tube with side-port above the GE junction and tip just distal to the GE junction. Recommend further advancing of the tube by additional 10 cm. Electronically Signed   By: Anner Crete M.D.   On: 12/06/2021 19:12   DG Abd 1 View  Result Date: 12/06/2021 CLINICAL DATA:  Nasogastric tube placement EXAM: ABDOMEN - 1 VIEW COMPARISON:  12/05/2021 FINDINGS: No gastric tube extends into the left mid abdomen beyond the margin of the examination. Mild bibasilar atelectasis. Tiny right pleural effusion. Right tunneled pleural drainage catheter is in place. Central venous catheter tip noted within the right atrium. Multiple gas-filled prominent loops of bowel are again seen within the upper abdomen suggestive of a a underlying ileus or distal small bowel obstruction. Pelvis excluded from view. No gross free intraperitoneal gas. IMPRESSION: Nasogastric tube tip extends into the mid abdomen, beyond the margin of the examination extending to at least the mid body  of the stomach. Stable ileus versus distal small bowel obstruction. Right tunneled pleural drainage catheter in place. Small residual right pleural effusion. Electronically Signed   By: Fidela Salisbury M.D.   On: 12/06/2021 00:24       LOS: 7 days   Glendon Fiser  [facility] Pager on [email or web address]  12/07/2021, 3:05 PM

## 2021-12-08 DIAGNOSIS — Z515 Encounter for palliative care: Secondary | ICD-10-CM

## 2021-12-08 DIAGNOSIS — J9601 Acute respiratory failure with hypoxia: Secondary | ICD-10-CM | POA: Diagnosis not present

## 2021-12-08 DIAGNOSIS — C799 Secondary malignant neoplasm of unspecified site: Secondary | ICD-10-CM | POA: Diagnosis not present

## 2021-12-08 DIAGNOSIS — K5900 Constipation, unspecified: Secondary | ICD-10-CM | POA: Diagnosis not present

## 2021-12-08 DIAGNOSIS — I3139 Other pericardial effusion (noninflammatory): Secondary | ICD-10-CM | POA: Diagnosis not present

## 2021-12-08 MED ORDER — MORPHINE SULFATE (PF) 4 MG/ML IV SOLN
4.0000 mg | Freq: Once | INTRAVENOUS | Status: AC
  Administered 2021-12-08: 4 mg via INTRAVENOUS

## 2021-12-08 MED ORDER — MORPHINE BOLUS VIA INFUSION
2.0000 mg | INTRAVENOUS | Status: DC | PRN
  Administered 2021-12-08 (×2): 4 mg via INTRAVENOUS
  Administered 2021-12-08: 2 mg via INTRAVENOUS

## 2021-12-08 MED ORDER — MORPHINE 100MG IN NS 100ML (1MG/ML) PREMIX INFUSION: 2.0000 mg/h | INTRAVENOUS | 0 refills | Status: AC

## 2021-12-09 ENCOUNTER — Encounter: Payer: Self-pay | Admitting: *Deleted

## 2021-12-09 DIAGNOSIS — N179 Acute kidney failure, unspecified: Secondary | ICD-10-CM

## 2021-12-09 DIAGNOSIS — K56609 Unspecified intestinal obstruction, unspecified as to partial versus complete obstruction: Secondary | ICD-10-CM

## 2021-12-09 DIAGNOSIS — R7401 Elevation of levels of liver transaminase levels: Secondary | ICD-10-CM

## 2021-12-09 NOTE — Progress Notes (Signed)
Omniseq cancelled on 12/09/21

## 2021-12-18 NOTE — Progress Notes (Signed)
Palliative Medicine Progress Note   Patient Name: Carly Abbott       Date: 01-02-2022 DOB: 19-Oct-1955  Age: 66 y.o. MRN#: [id number] Attending Physician: Albertine Patricia, MD Primary Care Physician: Dion Body, MD Admit Date: 12/06/2021  Reason for Consultation/Follow-up: To manage meant, end-of-life care  HPI/Patient Profile: 66 y.o. female with past medical history of depression, anxiety, multiple sclerosis, hypertension, and hyperlipidemia.  She initially presented to [facility] on 11/20/21 with shortness of breath and was found to have a right pleural effusion.  Effusion drained but rapidly re-occurred and pt readmitted to [facility] 5/6.   Summary of stay at [facility]:  5/4:    CXR showed right middle lobe PNA with associated right sided effusion 5/5:    Patient underwent thoracentesis and 1.2 L removed 5/8:    Pleurx catheter placed by interventional radiology 5/10:    -Pleural fluid cytology came back positive for malignancy, metastatic adenocarcinoma, features consistent with primary lung -Oncology consult note/Dr.Yu reflects likely overall poor prognosis, discussed option of systemic immunotherapy +/- chemotherapy             - CT chest showed moderate to large pericardial effusion and small bilateral pleural effusions 5/12:   2D echo confirmed moderately sized pericardial effusion, but showed no evidence of cardiac tamponade   Summary of stay at [facility]: 11/20/2021   Patient transferred to [facility] for evaluation by CT surgery for pericardial window 12/11/2021   Pericardial window and thoracotomy  12/05/21   patient developed nausea and vomiting overnight, NG tube inserted due to concerns of obstruction from severe constipation   Subjective: 10:45 - I was notified by palliative RN Hildred Alamin that patient  appears very uncomfortable.  Verbal orders given for morphine bolus of 4 mg and to increase infusion rate to 3 mg/hr. Hildred Alamin also reports that patient's NG tube has been clamped since earlier this morning and so far patient is tolerating with no nausea or vomiting.  11:20 -I spoke with patient's brother Hollywood by phone.  Expressed concern that Jorryn has declined overnight and is imminently approaching end-of-life.  Discussed that it would likely be very difficult to manage her symptoms at home.  Discussed that home suction is not compatible with NG tube, so it will need to either be removed or left in place and drained by gravity (which  will not be very effective).  I expressed concern that Oliana could die in the ambulance during transport, especially as it is at least a 30-minute drive to [place].  Client verbalizes understanding of all of this.  However, he feels that Cristal would want to have an opportunity to say goodbye to her cats even if she has to endure suffering to do that.  I expressed concern that at this point, Averianna will not likely know that she is home.  Clyde felt that Shavonte was still "lucid" when he saw her 8 hours ago and is adamant to at least try to get her home.  I provided assurance that we would continue to work on the plan to get her home.  Several minutes later, Clyde called me back.  After our conversation.  He had spoken with Ellin's friend Juliann Pulse, who was at bedside this morning.  After speaking with Shellee Milo has decided to allow Rameen to pass in the hospital.  He agrees that transporting her home is no longer the best option.  He is planning to bring 2 of her cats to bedside.  I told him to call me when he arrived, and I would meet him at the main entrance to avoid any issues with allowing the cats in the hospital.  13:10 -I met Clyde at the main entrance.  He has 1 cat in a carrier.  We go to Danaisha's room.  She appears to be actively dying, but seems more comfortable than this morning.   Morphine infusion is at 3 mg/hr. she is not responsive to voice or light touch.  However, we told Tilly that her cat was here to visit.  I then took the cat out of the carrier and placed her on the bed.  Plan of care was discussed with Dr. Waldron Labs, bedside RN, TOC, and hospice liaison  Objective:  Physical Exam Vitals reviewed.  Constitutional:      Appearance: She is ill-appearing.  Pulmonary:     Effort: No respiratory distress.  Neurological:     Mental Status: She is unresponsive.            Vital Signs: BP 98/78   Pulse 100   Temp 98 F (36.7 C) (Oral)   Resp 15   Ht 4' 10"  (1.473 m)   Wt 42.5 kg   SpO2 (!) 84%   BMI 19.58 kg/m  SpO2: SpO2: (!) 84 % O2 Device: O2 Device: Nasal Cannula O2 Flow Rate: O2 Flow Rate (L/min): 2 L/min   LBM: Last BM Date :  (PTA)     Palliative Medicine Assessment & Plan   Assessment: Principal Problem:   Pericardial effusion Active Problems:   Anxiety and depression   Pleural effusion on right   SIRS (systemic inflammatory response syndrome) (HCC)   Hypotension   Protein-calorie malnutrition, severe   Metastatic adenocarcinoma (HCC)   Acute respiratory failure with hypoxia (HCC)   Constipation   End of life care    Recommendations/Plan: Continue morphine infusion Bolus with 2-4 mg every 30 minutes as needed for uncontrolled pain or air hunger Brother has agreed that patient will remain in the hospital for end-of-life PMT will continue to support   Symptom Management:  Lorazepam (ATIVAN) prn for anxiety Glycopyrrolate (ROBINUL) for excessive secretions Ondansetron (ZOFRAN) prn for nausea Polyvinyl alcohol (LIQUIFILM TEARS) prn for dry eyes Antiseptic oral rinse (BIOTENE) prn for dry mouth  Code Status: DNR/DNI as previously documented   Prognosis:  Hours - Days (likely  hours)  Discharge Planning: Anticipated Hospital Death    Thank you for allowing the Palliative Medicine Team to assist in the care of this  patient.   Greater than 50%  of this time was spent counseling and coordinating care related to the above assessment and plan.  Total time: 80 minutes   Lavena Bullion, NP Palliative Medicine   Please contact Palliative Medicine Team phone at [phone] for questions and concerns.  For individual provider, see AMION.

## 2021-12-18 NOTE — Death Summary Note (Signed)
DEATH SUMMARY   Patient Details  Name: Carly Abbott MRN: [id number] DOB: 1956/06/15 TIW:[id number], Carly Muss, MD Admission/Discharge Information   Admit Date:  12-15-2021  Date of Death: Date of Death: 23-Dec-2021  Time of Death: Time of Death: 10-12-1835  Length of Stay: 8    Principle Cause of death:   Metastatic lung adenocarcinoma  Hospital Diagnoses: Principal Problem:    Metastatic adenocarcinoma to lung [facility])  Active Problems:   Pericardial effusion   End of life care   Malignant pericardial effusion   Malignant recurrent pleural effusion   Severe protein calorie malnutrition   Sepsis, present on admission due to multifocal pneumonia   Hypotension   Acute respiratory failure with hypoxia (HCC)   Anxiety and depression   Constipation   MS (multiple sclerosis) (HCC)   Multifocal pneumonia   Colon obstruction (HCC)   AKI (acute kidney injury) ([place])   Transaminitis  Hospital Course: No notes on file  Assessment and Plan:   66 y.o. female with medical history significant of depression, anxiety, MS, HTN. Pt with recent admit also at [facility] for SOB, pleural effusion on 5/4.  Effusion drained but rapidly re-occurred and pt readmitted to [facility] 5/6. stay at [facility] was significant for Pleurx catheter placed by interventional radiology on 5/8, pleural fluid came back metastatic adenocarcinoma with suspected lung origin.  oncology Dr. Tasia Catchings was reconsulted,  CT chest/2D echo confirmed significant pericardial effusion , patient transferred to [facility] for evaluation by CT surgery for pericardial window.  Where she had TEE with pericardial window with thoracotomy approach on 5/16s, and her sleep discontinued 5/18, Pt with malignant pleural effusion on cytology.  Likely lung origin.  As discussed with Dr.Yu from [facility], as it is TTF+, she is a lung markers, and favors lung origin.  As well patient had severe constipation/large bowel obstruction due to severe constipation/nausea/vomiting, where  imaging were significant for very large stool burden, with evidence of colon obstruction due to that, and evidence of stomach distention, she had nausea, vomiting where she required NG tube with intermittent suctioning, she is on multiple laxatives including milk of magnesia, lactulose, MiraLAX, Dulcolax, as well received a smog enema, and on scheduled suppositories as well, She has received Movantik and methylnaltrexone as well,Manual disimpaction unsuccessful on 5/20, was attempted by RN, due to to the stool being high and unreachable in the rectal vault.  Was hypotensive as well, she had sepsis due to pneumonia which was treated with appropriate antibiotics, hypotension responded to midodrine. -Patient was closely by palliative medicine, given her extreme frailty, severe deconditioning and her weight, as she was 42 kg, with multiple comorbidities, stage IV metastatic lung cancer, with very poor life quality, she was in significant amount of discomfort, pain, severe constipation with no bowel movements as well despite multiple attempts on multiple laxatives, manual disimpaction, and was not interested on any chemotherapy or surgical intervention, she is a retired Merchandiser, retail, her main concern was about her being comfortable, and to die in peace without any aggressive measures, initial plan was towards home with hospice, but given her significant amount of discomfort and pain and her need of morphine drip, and after discussion with brother, patient and palliative medicine decision has been made to allow for the hospital death, as it will provide greater amount of comfort, patient passed away 12/23/2021, at 6:37 PM, with brother at bedside, where she was comfortable.   Consultations:  Medical oncology at [facility]: Dr. Tasia Catchings.   Interventional radiology at [facility]  Cardiology at [facility]: Dr. Nehemiah Massed. Cardiothoracic surgery here at [place]   The results of significant diagnostics from this  hospitalization (including imaging, microbiology, ancillary and laboratory) are listed below for reference.   Significant Diagnostic Studies: DG Chest 1 View  Result Date: 12/01/2021 CLINICAL DATA:  Follow-up right pneumothorax and atelectasis. EXAM: CHEST  1 VIEW COMPARISON:  Prior today FINDINGS: A new right jugular central venous catheter seen with tip overlying the superior cavoatrial junction. Right-sided chest tube remains in place. No pneumothorax visualized on the current exam. Subcutaneous emphysema is noted in the left chest wall. Bibasilar infiltrates or atelectasis, right side greater than left, show no significant change. Heart size remains stable. IMPRESSION: New right jugular central venous catheter in appropriate position. No pneumothorax visualized. Stable bibasilar infiltrates or atelectasis, right side greater than left. Electronically Signed   By: Marlaine Hind M.D.   On: 11/29/2021 14:53   DG Chest 2 View  Result Date: 11/20/2021 CLINICAL DATA:  Possible sepsis with shortness of breath and cough, initial encounter EXAM: CHEST - 2 VIEW COMPARISON:  None Available. FINDINGS: Cardiac shadow is at the upper limits of normal in size. Lungs are hyperinflated. Right middle lobe pneumonia is seen with associated pleural effusion. No other focal abnormality is seen. IMPRESSION: Right middle lobe pneumonia with associated right-sided effusion. Electronically Signed   By: Inez Catalina M.D.   On: 11/20/2021 19:59   DG Abd 1 View  Result Date: 12/06/2021 CLINICAL DATA:  NG placement. EXAM: ABDOMEN - 1 VIEW COMPARISON:  Earlier radiograph dated 12/06/2021. FINDINGS: Enteric tube with side-port above the GE junction and tip just distal to the GE junction. Recommend further advancing of the tube by additional 10 cm. Dilated loops of bowel in the upper abdomen. Air in the right upper abdomen under the right hemidiaphragm appears similar to prior radiograph and may be intraluminal. However, the  possibility of pneumoperitoneum is not excluded CT may provide better evaluation if clinically indicated. Additional support lines in similar position. IMPRESSION: Enteric tube with side-port above the GE junction and tip just distal to the GE junction. Recommend further advancing of the tube by additional 10 cm. Electronically Signed   By: Anner Crete M.D.   On: 12/06/2021 19:12   DG Abd 1 View  Result Date: 12/06/2021 CLINICAL DATA:  Nasogastric tube placement EXAM: ABDOMEN - 1 VIEW COMPARISON:  12/05/2021 FINDINGS: No gastric tube extends into the left mid abdomen beyond the margin of the examination. Mild bibasilar atelectasis. Tiny right pleural effusion. Right tunneled pleural drainage catheter is in place. Central venous catheter tip noted within the right atrium. Multiple gas-filled prominent loops of bowel are again seen within the upper abdomen suggestive of a a underlying ileus or distal small bowel obstruction. Pelvis excluded from view. No gross free intraperitoneal gas. IMPRESSION: Nasogastric tube tip extends into the mid abdomen, beyond the margin of the examination extending to at least the mid body of the stomach. Stable ileus versus distal small bowel obstruction. Right tunneled pleural drainage catheter in place. Small residual right pleural effusion. Electronically Signed   By: Fidela Salisbury M.D.   On: 12/06/2021 00:24   CT Angio Chest Pulmonary Embolism (PE) W or WO Contrast  Result Date: 11/26/2021 CLINICAL DATA:  Chest pain, shortness of breath, hypoxia EXAM: CT ANGIOGRAPHY CHEST WITH CONTRAST TECHNIQUE: Multidetector CT imaging of the chest was performed using the standard protocol during bolus administration of intravenous contrast. Multiplanar CT image reconstructions and MIPs were obtained  to evaluate the vascular anatomy. RADIATION DOSE REDUCTION: This exam was performed according to the departmental dose-optimization program which includes automated exposure control,  adjustment of the mA and/or kV according to patient size and/or use of iterative reconstruction technique. CONTRAST:  54mL OMNIPAQUE IOHEXOL 350 MG/ML SOLN COMPARISON:  Chest radiograph done on 11/22/2021 FINDINGS: Cardiovascular: Moderate to large pericardial effusion is seen. There is homogeneous enhancement in thoracic aorta. There are no intraluminal filling defects in the pulmonary artery branches. Mediastinum/Nodes: Soft tissue density seen in the subcarinal region of mediastinum may be part of pericardial effusion or suggest enlarged lymph nodes. There are slightly enlarged lymph nodes in both hilar regions. Lungs/Pleura: Centrilobular emphysema is seen. Increased interstitial markings are seen in both lungs, more so on the right side. There are patchy infiltrates in the parahilar regions and lower lung fields suggesting atelectasis/pneumonia. Small bilateral pleural effusions are seen, more so on the right side. Right chest tube is noted in place. There is no pneumothorax. Upper Abdomen: Colon is interposed between liver and anterior abdominal wall. Stomach appears to be distended with fluid. Musculoskeletal: There is mild decrease in height of upper endplates of bodies of T7 and T9 vertebrae possibly mild old compression fractures. Alignment of posterior margins of vertebral bodies is unremarkable. Review of the MIP images confirms the above findings. IMPRESSION: There is no evidence of pulmonary artery embolism. There is no evidence of thoracic aortic dissection. Moderate to large pericardial effusion. Small bilateral pleural effusions. Infiltrates are seen in the both lungs, more so on the right side suggesting atelectasis/pneumonia. Centrilobular emphysema. Increased interstitial markings are seen in both lungs, more so on the right side suggesting possible interstitial pneumonia or asymmetric interstitial edema. Other findings as described in the body of the report. Electronically Signed   By: Elmer Picker M.D.   On: 11/26/2021 18:30   CT ABDOMEN PELVIS W CONTRAST  Result Date: 11/29/2021 CLINICAL DATA:  Unintended weight loss, malignant pleural effusion, no primary lung lesion identified * Tracking Code: BO * EXAM: CT ABDOMEN AND PELVIS WITH CONTRAST TECHNIQUE: Multidetector CT imaging of the abdomen and pelvis was performed using the standard protocol following bolus administration of intravenous contrast. RADIATION DOSE REDUCTION: This exam was performed according to the departmental dose-optimization program which includes automated exposure control, adjustment of the mA and/or kV according to patient size and/or use of iterative reconstruction technique. CONTRAST:  54mL OMNIPAQUE IOHEXOL 300 MG/ML  SOLN COMPARISON:  CT chest angiogram, 11/26/2021 FINDINGS: Lower chest: Small bilateral pleural effusions. Right-sided pleural drainage catheter in position. Masslike consolidation of the infrahilar right lung and right middle lobe as seen on prior dedicated examination of the chest. Left coronary artery calcifications. Small pericardial effusion. Hepatobiliary: No focal liver abnormality is seen. Status post cholecystectomy. No biliary dilatation. Pancreas: Unremarkable. No pancreatic ductal dilatation or surrounding inflammatory changes. Spleen: Normal in size without significant abnormality. Adrenals/Urinary Tract: Adrenal glands are unremarkable. Kidneys are normal, without renal calculi, solid lesion, or hydronephrosis. Bladder is unremarkable. Stomach/Bowel: The stomach is extremely distended with ingested material and oral contrast. There is however transit of oral contrast to the small bowel. Extremely large burden of stool and stool balls within the patulous cecum, with decompression of the transverse and more distal colon. Appendix appears normal. No evidence of bowel wall thickening, distention, or inflammatory changes. Vascular/Lymphatic: Aortic atherosclerosis. No enlarged abdominal or  pelvic lymph nodes. Reproductive: No mass or other significant abnormality. Other: No abdominal wall hernia or abnormality. No ascites. Musculoskeletal: No  acute or significant osseous findings. IMPRESSION: 1. Extremely large burden of stool and stool balls within the patulous cecum, with decompression of the transverse and more distal colon. Findings are concerning for obstipating lesion within the transverse colon near the hepatic flexure, however not directly visualized. Consider colonoscopy. 2. The stomach is extremely distended with ingested material and oral contrast. There is however transit of oral contrast to the small bowel. Findings are suggestive of incomplete gastric outlet obstruction. 3. Small bilateral pleural effusions. Right-sided pleural drainage catheter in position. Masslike consolidation of the infrahilar right lung and right middle lobe as seen on prior dedicated examination of the chest, appearance concerning for primary lung malignancy. 4. Coronary artery disease. Aortic Atherosclerosis (ICD10-I70.0). Electronically Signed   By: Delanna Ahmadi M.D.   On: 11/29/2021 07:45   DG Chest Port 1 View  Result Date: 12/05/2021 CLINICAL DATA:  Line placement. EXAM: PORTABLE CHEST 1 VIEW COMPARISON:  Dec 03, 2021. FINDINGS: Nasogastric tube tip is seen in proximal stomach. Right internal jugular catheter is again noted with distal tip in expected position of cavoatrial junction right-sided chest tube is noted without pneumothorax. IMPRESSION: Nasogastric tube tip seen in proximal stomach. Stable position of right internal jugular catheter. Electronically Signed   By: Marijo Conception M.D.   On: 12/05/2021 12:58   DG CHEST PORT 1 VIEW  Result Date: 12/03/2021 CLINICAL DATA:  Reason for exam: pericardial effusion, surgery follow up Hx of pericardial effusion. Hx of PERICARDIAL WINDOW WITH THORACOTOMY right - yesterday. EXAM: PORTABLE CHEST - 1 VIEW COMPARISON:  the previous day's study FINDINGS:  Stable right PleurX without significant effusion or pneumothorax. Stable right IJ catheter to the mid right atrium. Pericardial drain remains in place. Coarse bibasilar airspace opacities right greater than left, show some improvement since prior study. Heart size and mediastinal contours are within normal limits. Blunting of the lateral costophrenic angles as before. Cholecystectomy clips. IMPRESSION: 1. Stable support hardware 2. Improving bibasilar coarse airspace opacities. Electronically Signed   By: Lucrezia Europe M.D.   On: 12/03/2021 06:05   DG Chest Port 1 View  Result Date: 11/24/2021 CLINICAL DATA:  Follow-up right chest tube in place. EXAM: PORTABLE CHEST 1 VIEW COMPARISON:  12/10/2021. FINDINGS: 5:18 a.m., 12/09/2021. The cardiomediastinal silhouette is stable and the central vessels are normal caliber. Mild aortic atherosclerosis. Right chest tube with tip in the medial upper chest imposing at the level of the distal trachea, is unchanged in positioning. There is a trace right apical pneumothorax. Consolidation or atelectasis in the medial right lower lung field is slightly improved today. The remaining lungs clear with COPD. The sulci are sharp. No acute osseous abnormality. IMPRESSION: Slightly improved aeration in the medial right lower lung field and trace right apical pneumothorax. No other evidence of acute chest process. Right chest tube positioning is unaltered. Electronically Signed   By: Telford Nab M.D.   On: 12/04/2021 07:17   DG Chest Port 1 View  Result Date: 12/14/2021 CLINICAL DATA:  Shortness of breath. EXAM: PORTABLE CHEST 1 VIEW COMPARISON:  11/22/2021 FINDINGS: Right chest tube is new in the interval with right lower lobe collapse/consolidation and small right pleural effusion. Left lung clear. Cardiopericardial silhouette is at upper limits of normal for size. Telemetry leads overlie the chest. Gaseous distention of colon identified in the upper abdomen. IMPRESSION: Right  chest tube with right lower lobe collapse/consolidation and small right pleural effusion. Electronically Signed   By: Misty Stanley M.D.   On: 12/16/2021  10:46   DG Chest Port 1 View  Result Date: 11/22/2021 CLINICAL DATA:  Questionable sepsis - evaluate for abnormality Shortness of breath. EXAM: PORTABLE CHEST 1 VIEW COMPARISON:  Chest radiograph yesterday and 11/20/2021, patient underwent recent thoracentesis. FINDINGS: Right pleural effusion is increased from radiograph yesterday. Associated increased opacity in the right lower hemithorax may be related to layering pleural effusion or atelectasis/airspace disease. Small left pleural effusion is similar. Stable heart size and mediastinal contours. No pneumothorax. IMPRESSION: 1. Increased right pleural effusion from radiograph yesterday. Associated increased opacity in the right lower hemithorax may be related to layering pleural fluid or atelectasis/airspace disease. 2. Unchanged small left pleural effusion. Electronically Signed   By: Keith Rake M.D.   On: 11/22/2021 18:59   DG Chest Port 1 View  Result Date: 11/21/2021 CLINICAL DATA:  Status post right pleural effusion. EXAM: PORTABLE CHEST 1 VIEW COMPARISON:  Chest x-ray from yesterday. FINDINGS: Stable cardiomediastinal silhouette. Decreased now trace right pleural effusion status post thoracentesis. Somewhat improved aeration at the right lung base with residual patchy consolidation and interstitial opacity in the right middle lobe. New trace left pleural effusion. No pneumothorax. No acute osseous abnormality. IMPRESSION: 1. Decreased now trace right pleural effusion status post thoracentesis. No pneumothorax. 2. Somewhat improved aeration at the right lung base with residual patchy consolidation and interstitial opacity in the right middle lobe suspicious for pneumonia. Followup PA and lateral chest X-ray is recommended in 3-4 weeks following trial of antibiotic therapy to ensure resolution. 3.  New trace left pleural effusion. Electronically Signed   By: Titus Dubin M.D.   On: 11/21/2021 11:28   DG Abd Portable 1V  Result Date: 12/05/2021 CLINICAL DATA:  Nausea and vomiting. EXAM: PORTABLE ABDOMEN - 1 VIEW COMPARISON:  CT 11/28/2021 FINDINGS: Examination demonstrates mild amount of residual contrast within the colon from patient's previous CT scan. There is significant fecal retention over the rectum unchanged. Several air-filled loops of bowel over the right upper quadrant without significant change. Some which may be minimally prominent small bowel loops. No definite free peritoneal air. Surgical clips over the right upper quadrant. Mild degenerate change of the spine and hips. IMPRESSION: Nonspecific, nonobstructive bowel gas pattern. Significant fecal retention over the rectum unchanged. Electronically Signed   By: Marin Olp M.D.   On: 12/05/2021 09:11   ECHOCARDIOGRAM COMPLETE  Result Date: 11/28/2021    ECHOCARDIOGRAM REPORT   Patient Name:   RENNIE HACK Date of Exam: 11/27/2021 Medical Rec #:  [id number]   Height:       58.0 in Accession #:    [id number]  Weight:       89.2 lb Date of Birth:  Oct 03, 1955  BSA:          1.292 m Patient Age:    66 years    BP:           95/56 mmHg Patient Gender: F           HR:           87 bpm. Exam Location:  [facility] Procedure: 2D Echo, Cardiac Doppler and Color Doppler Indications:     Pericardial Effusion I31.3  History:         Patient has no prior history of Echocardiogram examinations.                  Risk Factors:Dyslipidemia.  Sonographer:     Sherrie Sport Referring Phys:  [id number] Loletha Grayer Diagnosing Phys: Serafina Royals MD  IMPRESSIONS  1. Left ventricular ejection fraction, by estimation, is 60 to 65%. The left ventricle has normal function. The left ventricle has no regional wall motion abnormalities. Left ventricular diastolic parameters were normal.  2. Right ventricular systolic function is normal. The right ventricular size is normal.   3. Moderate pericardial effusion. The pericardial effusion is circumferential. There is no evidence of cardiac tamponade.  4. The mitral valve is normal in structure. Trivial mitral valve regurgitation.  5. The aortic valve is normal in structure. Aortic valve regurgitation is trivial. FINDINGS  Left Ventricle: Left ventricular ejection fraction, by estimation, is 60 to 65%. The left ventricle has normal function. The left ventricle has no regional wall motion abnormalities. The left ventricular internal cavity size was normal in size. There is  no left ventricular hypertrophy. Left ventricular diastolic parameters were normal. Right Ventricle: The right ventricular size is normal. No increase in right ventricular wall thickness. Right ventricular systolic function is normal. Left Atrium: Left atrial size was normal in size. Right Atrium: Right atrial size was normal in size. Pericardium: A moderately sized pericardial effusion is present. The pericardial effusion is circumferential. There is diastolic collapse of the right atrial wall. There is no evidence of cardiac tamponade. Mitral Valve: The mitral valve is normal in structure. Trivial mitral valve regurgitation. MV peak gradient, 3.8 mmHg. The mean mitral valve gradient is 2.0 mmHg. Tricuspid Valve: The tricuspid valve is normal in structure. Tricuspid valve regurgitation is trivial. Aortic Valve: The aortic valve is normal in structure. Aortic valve regurgitation is trivial. Aortic valve mean gradient measures 2.5 mmHg. Aortic valve peak gradient measures 4.6 mmHg. Pulmonic Valve: The pulmonic valve was normal in structure. Pulmonic valve regurgitation is trivial. Aorta: The aortic root and ascending aorta are structurally normal, with no evidence of dilitation. IAS/Shunts: No atrial level shunt detected by color flow Doppler.  LEFT VENTRICLE PLAX 2D LVIDd:         2.70 cm Diastology LVIDs:         1.50 cm LV e' medial:    5.22 cm/s LV PW:         0.90 cm LV  E/e' medial:  12.6 LV IVS:        0.90 cm LV e' lateral:   4.79 cm/s                        LV E/e' lateral: 13.7  RIGHT VENTRICLE RV S prime:     21.90 cm/s TAPSE (M-mode): 2.4 cm LEFT ATRIUM             Index       RIGHT ATRIUM          Index LA diam:        1.60 cm 1.24 cm/m  RA Area:     4.38 cm LA Vol (A2C):   6.7 ml  5.19 ml/m  RA Volume:   4.88 ml  3.78 ml/m LA Vol (A4C):   1.9 ml  1.45 ml/m LA Biplane Vol: 3.7 ml  2.88 ml/m  AORTIC VALVE                   PULMONIC VALVE AV Vmax:           107.50 cm/s PV Vmax:        0.84 m/s AV Vmean:          74.850 cm/s PV Vmean:       55.100 cm/s AV  VTI:            0.169 m     PV VTI:         0.126 m AV Peak Grad:      4.6 mmHg    PV Peak grad:   2.8 mmHg AV Mean Grad:      2.5 mmHg    PV Mean grad:   1.0 mmHg LVOT Vmax:         90.10 cm/s  RVOT Peak grad: 3 mmHg LVOT Vmean:        53.700 cm/s LVOT VTI:          0.154 m LVOT/AV VTI ratio: 0.91  AORTA Ao Root diam: 2.50 cm MITRAL VALVE MV Area (PHT): 5.16 cm    SHUNTS MV Peak grad:  3.8 mmHg    Systemic VTI: 0.15 m MV Mean grad:  2.0 mmHg    Pulmonic VTI: 0.102 m MV Vmax:       0.98 m/s MV Vmean:      73.0 cm/s MV Decel Time: 147 msec MV E velocity: 65.60 cm/s MV A velocity: 97.30 cm/s MV E/A ratio:  0.67 Serafina Royals MD Electronically signed by Serafina Royals MD Signature Date/Time: 11/28/2021/12:35:33 PM    Final    IR PERC PLEURAL DRAIN W/INDWELL CATH W/IMG GUIDE  Result Date: 11/25/2021 INDICATION: Recurrent pleural effusion, suspected malignancy EXAM: Ultrasound-guided thoracentesis Placement of a tunneled pleural drainage catheter using fluoroscopic guidance MEDICATIONS: The patient is currently admitted to the hospital and receiving intravenous antibiotics. The antibiotics were administered within an appropriate time frame prior to the initiation of the procedure. ANESTHESIA/SEDATION: Moderate (conscious) sedation was employed during this procedure. A total of Versed 2 mg and Fentanyl 100 mcg was  administered intravenously by the radiology nurse. Total intra-service moderate Sedation Time: 29 minutes. The patient's level of consciousness and vital signs were monitored continuously by radiology nursing throughout the procedure under my direct supervision. COMPLICATIONS: None immediate. PROCEDURE: Informed written consent was obtained from the patient after a thorough discussion of the procedural risks, benefits and alternatives. All questions were addressed. Maximal Sterile Barrier Technique was utilized including caps, mask, sterile gowns, sterile gloves, sterile drape, hand hygiene and skin antiseptic. A timeout was performed prior to the initiation of the procedure. The patient was placed supine on the exam table. Limited ultrasound of the right chest was performed for planning purposes, this demonstrated a moderate right pleural effusion. Skin entry site was marked, the overlying skin was prepped and draped in the standard sterile fashion. Local analgesia was obtained with 1% lidocaine. Under ultrasound guidance, access was obtained into the right pleural space using a 19 gauge Yueh catheter. Location was confirmed with ultrasound and return of clear straw-colored pleural fluid. An 035 wire was easily advanced into the right pleural space under fluoroscopic guidance. The percutaneous tract was serially dilated. Attention was then turned to a more inferior and medial location in the chest. After the administration of additional local anesthetic, a small dermatotomy was made. From this location, a cuffed pleural drainage catheter was tunneled to the initial access site. A peel-away sheath was then advanced over the 035 wire, and through the sheath, the pleural drainage catheter was advanced into the right pleural space. Location was confirmed using fluoroscopic guidance. The catheter was tested and found to function appropriately. Approximally 1 L of clear straw-colored pleural fluid was removed. The  catheter was secured to the skin using silk suture and a dressing. The initial access  site was closed using 4-0 Vicryl and Dermabond. The patient tolerated the procedure well without immediate complication. IMPRESSION: Successful placement of a right-sided tunneled pleural drainage catheter using ultrasound and fluoroscopic guidance. Refer to instructions in EMR for use. Electronically Signed   By: Albin Felling M.D.   On: 11/25/2021 07:37   US THORACENTESIS ASP PLEURAL SPACE W/IMG GUIDE  Result Date: 11/21/2021 INDICATION: Patient admitted with right middle lobe pneumonia and found to have right-sided pleural effusion. Interventional radiology asked to perform a diagnostic and therapeutic thoracentesis. EXAM: ULTRASOUND GUIDED THORACENTESIS MEDICATIONS: 1% lidocaine 10 mL COMPLICATIONS: None immediate. PROCEDURE: An ultrasound guided thoracentesis was thoroughly discussed with the patient and questions answered. The benefits, risks, alternatives and complications were also discussed. The patient understands and wishes to proceed with the procedure. Written consent was obtained. Ultrasound was performed to localize and mark an adequate pocket of fluid in the right chest. The area was then prepped and draped in the normal sterile fashion. 1% Lidocaine was used for local anesthesia. Under ultrasound guidance a 6 Fr Safe-T-Centesis catheter was introduced. Thoracentesis was performed. The catheter was removed and a dressing applied. FINDINGS: A total of approximately 1.2 L of clear yellow fluid was removed. Samples were sent to the laboratory as requested by the clinical team. IMPRESSION: Successful ultrasound guided right thoracentesis yielding 1.2 L of pleural fluid. Read by: Soyla Dryer, NP Electronically Signed   By: Miachel Roux M.D.   On: 11/21/2021 11:50    Microbiology: Recent Results (from the past 240 hour(s))  Surgical pcr screen     Status: None   Collection Time: 12/13/2021  8:08 AM   Specimen:  Nasal Mucosa; Nasal Swab  Result Value Ref Range Status   MRSA, PCR NEGATIVE NEGATIVE Final   Staphylococcus aureus NEGATIVE NEGATIVE Final    Comment: (NOTE) The Xpert SA Assay (FDA approved for NASAL specimens in patients 75 years of age and older), is one component of a comprehensive surveillance program. It is not intended to diagnose infection nor to guide or monitor treatment. Performed at [facility], [place] [address] [place], [place] [address]      Signed: Phillips Climes, MD

## 2021-12-18 NOTE — Progress Notes (Addendum)
Patient UY:QIHK L Barz      DOB: [birth year]      VQQ:[id number]      Palliative Medicine Team    Subjective: Bedside symptom check completed. Patient's cousin Juliann Pulse bedside at time of visit. [place] liaison Anderson Malta also present at time of visit.    Physical exam: Patient laying in bed, alert at time of visit. Breathing noted to be tachypneac and very labored, no excessive secretions noted. Patient with use of accessory muscles and tripoding in bed with nasal cannula applied at 5L/min.Patient verbally denies pain or nausea at this time but obvious signs of discomfort noted in work of breathing and diaphoresis. Bedside RN in room to administer PRN ativan (see eMAR). This RN assisted in repositioning pillows to make patient more comfortable. Monitoring devices removed per comfort orders, monitor screen turned off in patient's room.   Assessment and plan: This RN spoke to the patient, her cousin Juliann Pulse, Physiological scientist and [facility] liaison together. This RN educated on observed signs of change from previous days and active dying process. This RN educated family on the medications being used to help treat the uncomfortable symptoms observed. This RN administered morphine bolus and increased rate per verbal order from NP (see eMAR). This RN decreased oxygen from 5L to 2L per nasal cannula after bolus dose given. Will continue to follow for any changes or advances.   Addendum: This RN returned bedside at 13:00, Patient tachypneac, diaphoretic, poorly positioned in bed. This RN rearranged pillows to support her favored position. This RN removed NG tube per verbal order from NP for comfort. This RN administered morphine bolus per order (see eMAR). Patient less interactive at this time but responds well to bolus, with decreased work of breathing and signs of discomfort decreased.      Thank you for allowing the Palliative Medicine Team to assist in the care of this patient.     Damian Leavell, MSN, RN Palliative  Medicine Team Team Phone: [phone]  This phone is monitored 7a-7p, please reach out to attending physician outside of these hours for urgent needs.

## 2021-12-18 NOTE — Progress Notes (Signed)
Approximately 1 cc of remainder of morphine infusion wasted with Delcine J.RN  New bag started at this time.

## 2021-12-18 NOTE — Care Management Important Message (Signed)
Important Message  Patient Details  Name: BRETTA FEES MRN: [id number] Date of Birth: January 04, 1956   Medicare Important Message Given:  Yes  Patient is at End of Life out of respect for the patient no IM given will mail to the home address.    Jenesa Foresta 2021-12-13, 4:31 PM

## 2021-12-18 NOTE — Progress Notes (Addendum)
75 mls of morphine measure and wasted with 2nd RN in medroom with Carl Best. Agricultural consultant.

## 2021-12-18 NOTE — Progress Notes (Signed)
[facility] PROGRESS NOTE   Carly Abbott [id number]:[id number] DOB: February 07, 1956 DOA: 11/28/2021  PCP: Dion Body, MD  Brief History/Interval Summary:   66 y.o. female with medical history significant of depression, anxiety, MS, HTN.   Pt with recent admit also at [facility] for SOB, pleural effusion on 5/4.  Effusion drained but rapidly re-occurred and pt readmitted to [facility] 5/6.   Summary of stay at [facility] - 5/8: Pleurx catheter placed by interventional radiology, pleural fluid came back metastatic adenocarcinoma with suspected lung origin.  oncology Dr. Tasia Catchings was reconsulted. - 11/26/2021 CT chest/2D echo confirmed significant pericardial effusion , patient transferred to [facility] for evaluation by CT surgery for pericardial window.  .  Significant events at [facility] - 5/16 status post TEE with pericardial window with thoracotomy approach -5/18, left side chest tube discontinued - 5/19, patient developed nausea and vomiting overnight, chest tube inserted due to concerns of bowel obstruction from severe constipation   Consultants:  Medical oncology at [facility]: Dr. Tasia Catchings.   Interventional radiology at [facility]  Cardiology at [facility]: Dr. Nehemiah Massed. Cardiothoracic surgery here at [place]   Procedures:  - Pleurx catheter insertion by IR on 5/8 at [facility] - Dr. Kipp Brood 11/28/2021 TRANSESOPHAGEAL ECHOCARDIOGRAM (TEE) (N/A) PERICARDIAL WINDOW WITH THORACOTOMY APPROACH (Right)  Subjective/Interval History:  Patient was started yesterday on morphine drip given significant amount of discomfort, unfortunately she remains with no bowel movements, NG tube remains in place  Assessment/Plan:    Goals of care/end-of-life care -Patient with stable comorbidities, stage IV static lung cancer, poor life quality, significant amount of discomfort, pain, severe constipation, no bowel movement as well, but multiple interventions attempted to resolve this has been unsuccessful,  she is severely malnourished, significantly deconditioned, she is only 42 kg, with severe malnutrition, significantly deconditioned, palliative medicine input greatly appreciated, at this point decision has been made by patient/brother to proceed with full comfort measures, she is requiring intensive care and management of her pain at this point, so home with hospice would be an appropriate, and  will arrange for her to stay on the hospital as a hospital death is anticipated.    Pericardial effusion - Echo shows large pericardial effusion but no tamponade physiology . Pt transferred from [facility] to [facility] for pericardial window.  - Effusion most likely malignant in etiology. -CT surgery input greatly appreciated, status post TEE and pericardial window with thoracotomy approach by Dr. Kipp Brood 5/16, postoperative management per CT surgery, continue continue with as needed pain regimen. -Chest tube discontinued 5/18 . -Leukocytosis trending down, leukocytosis most likely due to steroids, procalcitonin is reassuring.    Acute respiratory failure with hypoxia (HCC) -She remains on 4 to 6 L nasal cannula -Respiratory status very tenuous, she was encouraged to use incentive spirometer.   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  Likely lung origin.  As discussed with Dr.Yu from [facility], as it is TTF+, she is a lung markers, and favors lung origin.  - Will need outpatient follow-up with him after discharge. Per last note by Dr. Tasia Catchings, prognosis is thought to be poor.  Waiting on further studies to see if she will be a candidate for immunotherapy.  Patient told him that she was leaning towards no chemotherapy.  Could be a candidate for hospice going forward but will depend on results of further test that were ordered. Started on cough suppressants.  Patient's status noted to be tenuous.   -Palliative medicine assistance  greatly appreciated, initial plan was for home with  hospice, but currently hospital death is expected  AKI -Resolved with IV fluids and midodrine  Hyperkalemia -Resolved with Lokelma  Severe constipation/large bowel obstruction due to severe constipation/nausea/vomiting - Acute constipation noted on CT. started on aggressive bowel regimen.  There was some question of gastric outlet obstruction but patient without any symptoms.  -She remains with no bowel movement despite aggressive bowel regimen, she is on MiraLAX, milk of magnesium, and received as needed suppositories, despite that remains with no BM, so add lactulose has been added, and Dulcolax suppository.  . -She is on Movantik as well . -CT abdomen and pelvis with large stool burden in the cecum, with decompression of transverse and more distal colon, concerning for constipating lesions within the transverse colon near the hepatic flexure, patient had colonoscopy in 2017 with no acute findings, her findings most likely related to severe constipation. -G-tube inserted yesterday, with significant amount of output, it was dislodged today . -NG tube was reinserted yesterday given significant symptoms including nausea, vomiting. -Continue with good bowel regimen, she is on multiple laxatives including milk of magnesia, lactulose, MiraLAX, Dulcolax, as well received a smog enema, and on scheduled suppositories as well, . -She has received Movantik and methylnaltrexone -Manual disimpaction unsuccessful on 5/20, was attempted by RN, due to to the stool being high and unreachable in the rectal vault.  Hypotension Pt had tachycardia and hypotension during stay at [facility], started on midodrine.  Blood pressures have improved.  Blood pressure noted to be in the hypertensive range now.  Cut back on midodrine.   Community-acquired pneumonia CT at [facility] showing PNA.  Was placed on ceftriaxone and azithromycin.  Completed course of antibiotics.    Pleural effusion on right Patient underwent thoracentesis  on 5/5 with 1.2 L fluid removed.  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  IR placed a Pleurx drainage catheter on 5/8. Experiencing significant pain and discomfort.  Was started on OxyContin 10 mg twice a day and oxycodone as needed.  Continue cough suppressants.  May need to increase the dose of the narcotics.  Consider doing so after her procedure. Drainage per the Plurex order set.    Subclinical hypothyroidism TSH noted to be elevated at 9.7.  Patient mentions that she has a longstanding history of subclinical hypothyroidism.  Free T4 noted to be 0.94.  No further work-up at this time.  Abnormal LFTs Mildly elevated AST and ALT level noted.  Only done CT scan did not show any liver lesions.   -Trending down  Protein-calorie malnutrition, severe In setting of metastatic CA   Anxiety and depression Continue zoloft and valium.  History of multiple sclerosis Stable.  Uses Flexeril at home.         DVT Prophylaxis: Lovenox Code Status: DNR.  DNR  Family Communication: Brother at bedside Disposition Plan: Home with hospice  Status is: Inpatient      Medications: Scheduled:  cyclobenzaprine  10 mg Per Tube BID   magnesium hydroxide  30 mL Per Tube BID   methocarbamol  500 mg Per Tube Q8H   midodrine  5 mg Per Tube TID WC   polyethylene glycol  17 g Per Tube BID   senna-docusate  2 tablet Per Tube BID   sertraline  50 mg Per Tube Daily   Continuous:  morphine 2 mg/hr (12/24/2021 0143)   promethazine (PHENERGAN) injection (IM or IVPB)      FYB:OFBPZWCHENIDP **OR** acetaminophen, antiseptic oral rinse, chlorpheniramine-HYDROcodone, glycopyrrolate **  OR** glycopyrrolate **OR** glycopyrrolate, lip balm, LORazepam, morphine, ondansetron **OR** ondansetron (ZOFRAN) IV, oxyCODONE, polyvinyl alcohol, promethazine (PHENERGAN) injection (IM or IVPB), sodium chloride flush, sodium phosphate, sorbitol, milk of mag, mineral oil, glycerin (SMOG)  enema  Antibiotics: Anti-infectives (From admission, onward)    Start     Dose/Rate Route Frequency Ordered Stop   12/01/21 0801  ceFAZolin (ANCEF) IVPB 2g/100 mL premix        2 g 200 mL/hr over 30 Minutes Intravenous 30 min pre-op 12/01/21 0801 12/16/2021 1337   12/11/2021 1000  azithromycin (ZITHROMAX) tablet 250 mg        250 mg Oral Daily 11/29/2021 0122 12/01/21 1049   12/04/2021 1000  cefTRIAXone (ROCEPHIN) 2 g in sodium chloride 0.9 % 100 mL IVPB        2 g 200 mL/hr over 30 Minutes Intravenous Every 24 hours 11/18/2021 0122 12/01/21 1130       Objective:  Vital Signs  Vitals:   12/07/21 1500 12/07/21 2001 12/07/21 2323 12/13/2021 0400  BP: (!) 95/52 107/70 (!) 97/53 98/78  Pulse:  99 92 100  Resp: 16 11 17 15   Temp: 98.1 F (36.7 C) 98.1 F (36.7 C) 98 F (36.7 C)   TempSrc: Axillary Axillary Oral   SpO2: 91% (!) 89% (!) 81% (!) 84%  Weight:      Height:        Intake/Output Summary (Last 24 hours) at Dec 13, 2021 1220 Last data filed at 12/13/2021 0143 Gross per 24 hour  Intake 33.75 ml  Output --  Net 33.75 ml    Filed Weights   11/29/2021 0400 12/03/21 0359 12/04/21 0600  Weight: 41.2 kg 42.8 kg 42.5 kg    Attendant, responsive, opening eyes to some stimuli, but closes eyes right away, extremely frail, deconditioned, chronically ill-appearing Diminished air entry bilaterally Regular rate and rhythm Abdomen mildly distended Extremities with no edema,      Lab Results:  Data Reviewed: I have personally reviewed following labs and reports of the imaging studies  CBC: Recent Labs  Lab 12/03/21 0435 12/04/21 0409 12/05/21 0435 12/06/21 0409 12/07/21 0143  WBC 20.2* 25.6* 18.6* 18.6* 19.3*  HGB 13.5 14.7 13.3 13.4 14.1  HCT 42.9 46.7* 41.7 42.2 43.8  MCV 94.9 94.3 93.7 94.0 93.4  PLT 559* 672* 552* 584* 523*     Basic Metabolic Panel: Recent Labs  Lab 12/03/21 0435 12/04/21 0409 12/04/21 2300 12/05/21 0435 12/06/21 0409 12/07/21 0143  NA  134* 136  --  135 138 140  K 5.1 5.7* 4.6 4.5 4.4 3.7  CL 99 94*  --  92* 90* 87*  CO2 26 30  --  33* 38* 38*  GLUCOSE 125* 152*  --  134* 116* 124*  BUN 24* 34*  --  31* 30* 29*  CREATININE 0.59 1.13*  --  0.73 0.76 0.77  CALCIUM 8.6* 9.3  --  8.7* 9.2 8.9  MG  --   --   --   --  2.3 2.3  PHOS  --   --   --   --  4.0 4.0     GFR: Estimated Creatinine Clearance: 44.1 mL/min (by C-G formula based on SCr of 0.77 mg/dL).    Recent Results (from the past 240 hour(s))  Surgical pcr screen     Status: None   Collection Time: 11/26/2021  8:08 AM   Specimen: Nasal Mucosa; Nasal Swab  Result Value Ref Range Status   MRSA, PCR NEGATIVE NEGATIVE Final  Staphylococcus aureus NEGATIVE NEGATIVE Final    Comment: (NOTE) The Xpert SA Assay (FDA approved for NASAL specimens in patients 53 years of age and older), is one component of a comprehensive surveillance program. It is not intended to diagnose infection nor to guide or monitor treatment. Performed at [facility], [place] [address] [place], [place] [address]        Radiology Studies: DG Abd 1 View  Result Date: 12/06/2021 CLINICAL DATA:  NG placement. EXAM: ABDOMEN - 1 VIEW COMPARISON:  Earlier radiograph dated 12/06/2021. FINDINGS: Enteric tube with side-port above the GE junction and tip just distal to the GE junction. Recommend further advancing of the tube by additional 10 cm. Dilated loops of bowel in the upper abdomen. Air in the right upper abdomen under the right hemidiaphragm appears similar to prior radiograph and may be intraluminal. However, the possibility of pneumoperitoneum is not excluded CT may provide better evaluation if clinically indicated. Additional support lines in similar position. IMPRESSION: Enteric tube with side-port above the GE junction and tip just distal to the GE junction. Recommend further advancing of the tube by additional 10 cm. Electronically Signed   By: Anner Crete M.D.   On: 12/06/2021  19:12       LOS: 8 days   Dwana Garin  [facility] Pager on [email or web address]  12-25-2021, 12:20 PM

## 2021-12-18 DEATH — deceased

## 2022-01-05 ENCOUNTER — Other Ambulatory Visit (INDEPENDENT_AMBULATORY_CARE_PROVIDER_SITE_OTHER): Payer: Self-pay | Admitting: Nurse Practitioner
